# Patient Record
Sex: Female | Born: 1955 | Hispanic: Yes | Marital: Married | Smoking: Never smoker
Health system: Southern US, Community
[De-identification: ages and names within clinical notes are randomized; demographics above are authoritative.]

## PROBLEM LIST (undated history)

## (undated) DIAGNOSIS — E119 Type 2 diabetes mellitus without complications: Secondary | ICD-10-CM

## (undated) DIAGNOSIS — K2970 Gastritis, unspecified, without bleeding: Secondary | ICD-10-CM

## (undated) DIAGNOSIS — Z8042 Family history of malignant neoplasm of prostate: Secondary | ICD-10-CM

## (undated) DIAGNOSIS — E538 Deficiency of other specified B group vitamins: Secondary | ICD-10-CM

## (undated) DIAGNOSIS — R011 Cardiac murmur, unspecified: Secondary | ICD-10-CM

## (undated) DIAGNOSIS — Z9221 Personal history of antineoplastic chemotherapy: Secondary | ICD-10-CM

## (undated) DIAGNOSIS — E785 Hyperlipidemia, unspecified: Secondary | ICD-10-CM

## (undated) DIAGNOSIS — Z8719 Personal history of other diseases of the digestive system: Secondary | ICD-10-CM

## (undated) DIAGNOSIS — Z95828 Presence of other vascular implants and grafts: Secondary | ICD-10-CM

## (undated) DIAGNOSIS — R002 Palpitations: Secondary | ICD-10-CM

## (undated) DIAGNOSIS — I10 Essential (primary) hypertension: Secondary | ICD-10-CM

## (undated) DIAGNOSIS — C50911 Malignant neoplasm of unspecified site of right female breast: Secondary | ICD-10-CM

## (undated) HISTORY — DX: Family history of malignant neoplasm of prostate: Z80.42

## (undated) HISTORY — PX: CHOLECYSTECTOMY: SHX55

## (undated) HISTORY — DX: Palpitations: R00.2

## (undated) HISTORY — DX: Hyperlipidemia, unspecified: E78.5

## (undated) HISTORY — DX: Type 2 diabetes mellitus without complications: E11.9

## (undated) HISTORY — DX: Malignant neoplasm of unspecified site of right female breast: C50.911

## (undated) HISTORY — PX: BREAST BIOPSY: SHX20

## (undated) HISTORY — DX: Essential (primary) hypertension: I10

## (undated) HISTORY — DX: Personal history of other diseases of the digestive system: Z87.19

## (undated) HISTORY — PX: BREAST CYST EXCISION: SHX579

## (undated) HISTORY — PX: PORT-A-CATH REMOVAL: SHX5289

## (undated) HISTORY — DX: Cardiac murmur, unspecified: R01.1

## (undated) HISTORY — DX: Gastritis, unspecified, without bleeding: K29.70

## (undated) MED FILL — Dexamethasone Sodium Phosphate Inj 100 MG/10ML: INTRAMUSCULAR | Qty: 1 | Status: AC

## (undated) MED FILL — Fosaprepitant Dimeglumine For IV Infusion 150 MG (Base Eq): INTRAVENOUS | Qty: 5 | Status: AC

---

## 2015-11-04 HISTORY — PX: BREAST BIOPSY: SHX20

## 2016-11-02 DIAGNOSIS — I10 Essential (primary) hypertension: Secondary | ICD-10-CM | POA: Insufficient documentation

## 2016-11-02 DIAGNOSIS — K2970 Gastritis, unspecified, without bleeding: Secondary | ICD-10-CM | POA: Insufficient documentation

## 2016-11-02 DIAGNOSIS — E119 Type 2 diabetes mellitus without complications: Secondary | ICD-10-CM | POA: Insufficient documentation

## 2016-11-02 DIAGNOSIS — R011 Cardiac murmur, unspecified: Secondary | ICD-10-CM | POA: Insufficient documentation

## 2016-11-15 DIAGNOSIS — Z8719 Personal history of other diseases of the digestive system: Secondary | ICD-10-CM | POA: Insufficient documentation

## 2017-01-17 DIAGNOSIS — R002 Palpitations: Secondary | ICD-10-CM | POA: Insufficient documentation

## 2017-03-22 DIAGNOSIS — F419 Anxiety disorder, unspecified: Secondary | ICD-10-CM | POA: Insufficient documentation

## 2019-09-10 ENCOUNTER — Other Ambulatory Visit: Payer: Self-pay | Admitting: Family Medicine

## 2019-09-10 DIAGNOSIS — Z13820 Encounter for screening for osteoporosis: Secondary | ICD-10-CM

## 2020-03-29 ENCOUNTER — Ambulatory Visit (INDEPENDENT_AMBULATORY_CARE_PROVIDER_SITE_OTHER): Payer: Medicare Other | Admitting: Cardiology

## 2020-03-29 ENCOUNTER — Other Ambulatory Visit: Payer: Self-pay

## 2020-03-29 ENCOUNTER — Encounter: Payer: Self-pay | Admitting: Cardiology

## 2020-03-29 ENCOUNTER — Ambulatory Visit (INDEPENDENT_AMBULATORY_CARE_PROVIDER_SITE_OTHER): Payer: Medicare Other

## 2020-03-29 VITALS — BP 108/62 | HR 74 | Ht 60.0 in | Wt 116.5 lb

## 2020-03-29 DIAGNOSIS — R002 Palpitations: Secondary | ICD-10-CM

## 2020-03-29 DIAGNOSIS — E7800 Pure hypercholesterolemia, unspecified: Secondary | ICD-10-CM | POA: Diagnosis not present

## 2020-03-29 DIAGNOSIS — I10 Essential (primary) hypertension: Secondary | ICD-10-CM

## 2020-03-29 NOTE — Progress Notes (Signed)
Cardiology Office Note:    Date:  03/29/2020   ID:  Susan [name], DOB [birth year], MRN [id number]  PCP:  Patient, No Pcp Per  Cardiologist:  Kate Sable, MD  Electrophysiologist:  None   Referring MD: Bunnie Pion, FNP   No chief complaint on file. cc: palpitations  History of Present Illness:    Susan [name] is a 64 y.o. female with a hx of hypertension, hyperlipidemia, diabetes who presents due to palpitations.  Patient states having symptoms of palpitations for years now which is worsened over the past year.  She states having increased heart rates at least 1-2 times a week lasting about 10 minutes.  She has noticed some chest pressure whenever her heart rates increased.  Exertion such as going up stairs causes increased heart rates.  She denies any history of heart disease.  Takes all her medications as prescribed.  She is able to walk and do all her activities of daily living without any symptoms of chest pain or shortness of breath.  She denies dizziness, edema, orthopnea, syncope.  Past Medical History:  Diagnosis Date  . Diabetes mellitus without complication ([place])   . Hyperlipidemia   . Hypertension     Past Surgical History:  Procedure Laterality Date  . CHOLECYSTECTOMY      Current Medications: Current Meds  Medication Sig  . atorvastatin (LIPITOR) 80 MG tablet Take 80 mg by mouth daily.  . DULoxetine (CYMBALTA) 60 MG capsule Take 60 mg by mouth daily.  . enalapril (VASOTEC) 20 MG tablet Take 20 mg by mouth daily.  . fenofibrate (TRICOR) 145 MG tablet Take 145 mg by mouth daily.  Marland Kitchen gabapentin (NEURONTIN) 300 MG capsule Take 300 mg by mouth in the morning and at bedtime.  Marland Kitchen glipiZIDE (GLUCOTROL XL) 2.5 MG 24 hr tablet Take 2.5 mg by mouth daily.  . hydrochlorothiazide (HYDRODIURIL) 25 MG tablet Take 25 mg by mouth daily.     Allergies:   Contrast media  [iodinated diagnostic agents]   Social History   Socioeconomic History  . Marital status:  Unknown    Spouse name: Not on file  . Number of children: Not on file  . Years of education: Not on file  . Highest education level: Not on file  Occupational History  . Not on file  Tobacco Use  . Smoking status: Never Smoker  . Smokeless tobacco: Never Used  Substance and Sexual Activity  . Alcohol use: Never  . Drug use: Never  . Sexual activity: Not on file  Other Topics Concern  . Not on file  Social History Narrative  . Not on file   Social Determinants of Health   Financial Resource Strain:   . Difficulty of Paying Living Expenses:   Food Insecurity:   . Worried About Charity fundraiser in the Last Year:   . Arboriculturist in the Last Year:   Transportation Needs:   . Film/video editor (Medical):   Marland Kitchen Lack of Transportation (Non-Medical):   Physical Activity:   . Days of Exercise per Week:   . Minutes of Exercise per Session:   Stress:   . Feeling of Stress :   Social Connections:   . Frequency of Communication with Friends and Family:   . Frequency of Social Gatherings with Friends and Family:   . Attends Religious Services:   . Active Member of Clubs or Organizations:   . Attends Archivist Meetings:   .  Marital Status:      Family History: The patient's family history includes Heart disease in her father.  ROS:   Please see the history of present illness.     All other systems reviewed and are negative.  EKGs/Labs/Other Studies Reviewed:    The following studies were reviewed today:  EKG:  EKG is  ordered today.  The ekg ordered today demonstrates normal sinus rhythm, normal ECG.  Recent Labs: No results found for requested labs within last 8760 hours.  Recent Lipid Panel No results found for: CHOL, TRIG, HDL, CHOLHDL, VLDL, LDLCALC, LDLDIRECT  Physical Exam:    VS:  BP 108/62 (BP Location: Right Arm, Patient Position: Sitting, Cuff Size: Normal)   Pulse 74   Ht 5' (1.524 m)   Wt 116 lb 8 oz (52.8 kg)   SpO2 97%   BMI  22.75 kg/m     Wt Readings from Last 3 Encounters:  03/29/20 116 lb 8 oz (52.8 kg)     GEN:  Well nourished, well developed in no acute distress HEENT: Normal NECK: No JVD; No carotid bruits LYMPHATICS: No lymphadenopathy CARDIAC: RRR, no murmurs, rubs, gallops RESPIRATORY:  Clear to auscultation without rales, wheezing or rhonchi  ABDOMEN: Soft, non-tender, non-distended MUSCULOSKELETAL:  No edema; No deformity  SKIN: Warm and dry NEUROLOGIC:  Alert and oriented x 3 PSYCHIATRIC:  Normal affect   ASSESSMENT:    1. Palpitations   2. Essential hypertension   3. Pure hypercholesterolemia    PLAN:    In order of problems listed above:  1. Patient with history of worsening palpitations.  Symptoms are sometimes made worse with exertion.  Will evaluate patient with a cardiac monitor x2 weeks for any arrhythmias. 2. History of hypertension, blood pressure adequately controlled.  Continue HCTZ and enalapril. 3. Patient with history of hyperlipidemia, continue Lipitor 80 mg daily.  Follow-up after cardiac monitor  This note was generated in part or whole with voice recognition software. Voice recognition is usually quite accurate but there are transcription errors that can and very often do occur. I apologize for any typographical errors that were not detected and corrected.  Medication Adjustments/Labs and Tests Ordered: Current medicines are reviewed at length with the patient today.  Concerns regarding medicines are outlined above.  Orders Placed This Encounter  Procedures  . LONG TERM MONITOR (3-14 DAYS)  . EKG 12-Lead   No orders of the defined types were placed in this encounter.   Patient Instructions  Medication Instructions:  Your physician recommends that you continue on your current medications as directed. Please refer to the Current Medication list given to you today.  *If you need a refill on your cardiac medications before your next appointment, please call  your pharmacy*   Lab Work: None ordered If you have labs (blood work) drawn today and your tests are completely normal, you will receive your results only by: Marland Kitchen MyChart Message (if you have MyChart) OR . A paper copy in the mail If you have any lab test that is abnormal or we need to change your treatment, we will call you to review the results.   Testing/Procedures: Your physician has recommended that you wear a Zio monitor. This monitor is a medical device that records the heart's electrical activity. Doctors most often use these monitors to diagnose arrhythmias. Arrhythmias are problems with the speed or rhythm of the heartbeat. The monitor is a small device applied to your chest. You can wear one while you do  your normal daily activities. While wearing this monitor if you have any symptoms to push the button and record what you felt. Once you have worn this monitor for the period of time provider prescribed (Usually 14 days), you will return the monitor device in the postage paid box. Once it is returned they will download the data collected and provide [place] with a report which the provider will then review and we will call you with those results. Important tips:  1. Avoid showering during the first 24 hours of wearing the monitor. 2. Avoid excessive sweating to help maximize wear time. 3. Do not submerge the device, no hot tubs, and no swimming pools. 4. Keep any lotions or oils away from the patch. 5. After 24 hours you may shower with the patch on. Take brief showers with your back facing the shower head.  6. Do not remove patch once it has been placed because that will interrupt data and decrease adhesive wear time. 7. Push the button when you have any symptoms and write down what you were feeling. 8. Once you have completed wearing your monitor, remove and place into box which has postage paid and place in your outgoing mailbox.  9. If for some reason you have misplaced your box then  call our office and we can provide another box and/or mail it off for you.         Follow-Up: At [facility], you and your health needs are our priority.  As part of our continuing mission to provide you with exceptional heart care, we have created designated Provider Care Teams.  These Care Teams include your primary Cardiologist (physician) and Advanced Practice Providers (APPs -  Physician Assistants and Nurse Practitioners) who all work together to provide you with the care you need, when you need it.  We recommend signing up for the patient portal called "MyChart".  Sign up information is provided on this After Visit Summary.  MyChart is used to connect with patients for Virtual Visits (Telemedicine).  Patients are able to view lab/test results, encounter notes, upcoming appointments, etc.  Non-urgent messages can be sent to your provider as well.   To learn more about what you can do with MyChart, go to [email or web address].    Your next appointment:   4 week(s)  The format for your next appointment:   In Person  Provider:    You may see Kate Sable, MD or one of the following Advanced Practice Providers on your designated Care Team:    Murray Hodgkins, NP  Christell Faith, PA-C  Marrianne Mood, PA-C    Other Instructions N/A     Signed, Kate Sable, MD  03/29/2020 5:18 PM    [place]

## 2020-03-29 NOTE — Patient Instructions (Signed)
Medication Instructions:  Your physician recommends that you continue on your current medications as directed. Please refer to the Current Medication list given to you today.  *If you need a refill on your cardiac medications before your next appointment, please call your pharmacy*   Lab Work: None ordered If you have labs (blood work) drawn today and your tests are completely normal, you will receive your results only by: Marland Kitchen MyChart Message (if you have MyChart) OR . A paper copy in the mail If you have any lab test that is abnormal or we need to change your treatment, we will call you to review the results.   Testing/Procedures: Your physician has recommended that you wear a Zio monitor. This monitor is a medical device that records the heart's electrical activity. Doctors most often use these monitors to diagnose arrhythmias. Arrhythmias are problems with the speed or rhythm of the heartbeat. The monitor is a small device applied to your chest. You can wear one while you do your normal daily activities. While wearing this monitor if you have any symptoms to push the button and record what you felt. Once you have worn this monitor for the period of time provider prescribed (Usually 14 days), you will return the monitor device in the postage paid box. Once it is returned they will download the data collected and provide [place] with a report which the provider will then review and we will call you with those results. Important tips:  1. Avoid showering during the first 24 hours of wearing the monitor. 2. Avoid excessive sweating to help maximize wear time. 3. Do not submerge the device, no hot tubs, and no swimming pools. 4. Keep any lotions or oils away from the patch. 5. After 24 hours you may shower with the patch on. Take brief showers with your back facing the shower head.  6. Do not remove patch once it has been placed because that will interrupt data and decrease adhesive wear time. 7. Push  the button when you have any symptoms and write down what you were feeling. 8. Once you have completed wearing your monitor, remove and place into box which has postage paid and place in your outgoing mailbox.  9. If for some reason you have misplaced your box then call our office and we can provide another box and/or mail it off for you.         Follow-Up: At [facility], you and your health needs are our priority.  As part of our continuing mission to provide you with exceptional heart care, we have created designated Provider Care Teams.  These Care Teams include your primary Cardiologist (physician) and Advanced Practice Providers (APPs -  Physician Assistants and Nurse Practitioners) who all work together to provide you with the care you need, when you need it.  We recommend signing up for the patient portal called "MyChart".  Sign up information is provided on this After Visit Summary.  MyChart is used to connect with patients for Virtual Visits (Telemedicine).  Patients are able to view lab/test results, encounter notes, upcoming appointments, etc.  Non-urgent messages can be sent to your provider as well.   To learn more about what you can do with MyChart, go to [email or web address].    Your next appointment:   4 week(s)  The format for your next appointment:   In Person  Provider:    You may see Kate Sable, MD or one of the following Advanced Practice Providers on your  designated Care Team:    Murray Hodgkins, NP  Christell Faith, PA-C  Marrianne Mood, PA-C    Other Instructions N/A

## 2020-04-30 ENCOUNTER — Other Ambulatory Visit: Payer: Self-pay

## 2020-04-30 ENCOUNTER — Encounter: Payer: Self-pay | Admitting: Cardiology

## 2020-04-30 ENCOUNTER — Ambulatory Visit: Payer: Medicare Other | Admitting: Cardiology

## 2020-04-30 VITALS — BP 120/62 | HR 81 | Ht 61.0 in | Wt 120.5 lb

## 2020-04-30 DIAGNOSIS — I471 Supraventricular tachycardia: Secondary | ICD-10-CM

## 2020-04-30 DIAGNOSIS — E7800 Pure hypercholesterolemia, unspecified: Secondary | ICD-10-CM | POA: Diagnosis not present

## 2020-04-30 DIAGNOSIS — I10 Essential (primary) hypertension: Secondary | ICD-10-CM

## 2020-04-30 MED ORDER — HYDROCHLOROTHIAZIDE 12.5 MG PO CAPS
12.5000 mg | ORAL_CAPSULE | Freq: Every day | ORAL | 3 refills | Status: DC
Start: 2020-04-30 — End: 2022-02-22

## 2020-04-30 MED ORDER — METOPROLOL SUCCINATE ER 25 MG PO TB24
25.0000 mg | ORAL_TABLET | Freq: Every day | ORAL | 3 refills | Status: AC
Start: 2020-04-30 — End: ?

## 2020-04-30 NOTE — Patient Instructions (Signed)
Medication Instructions:  Your physician has recommended you make the following change in your medication:  1. START Metoprolol succinate (Topral XL) 25 mg once daily 2. DECREASE Hydrochlorothiazide 12.5 mg once daily   *If you need a refill on your cardiac medications before your next appointment, please call your pharmacy*   Lab Work: None  If you have labs (blood work) drawn today and your tests are completely normal, you will receive your results only by: Marland Kitchen MyChart Message (if you have MyChart) OR . A paper copy in the mail If you have any lab test that is abnormal or we need to change your treatment, we will call you to review the results.   Testing/Procedures: None   Follow-Up: At [facility], you and your health needs are our priority.  As part of our continuing mission to provide you with exceptional heart care, we have created designated Provider Care Teams.  These Care Teams include your primary Cardiologist (physician) and Advanced Practice Providers (APPs -  Physician Assistants and Nurse Practitioners) who all work together to provide you with the care you need, when you need it.  We recommend signing up for the patient portal called "MyChart".  Sign up information is provided on this After Visit Summary.  MyChart is used to connect with patients for Virtual Visits (Telemedicine).  Patients are able to view lab/test results, encounter notes, upcoming appointments, etc.  Non-urgent messages can be sent to your provider as well.   To learn more about what you can do with MyChart, go to [email or web address].    Your next appointment:   5 month(s)  The format for your next appointment:   In Person  Provider:    You may see Kate Sable, MD or one of the following Advanced Practice Providers on your designated Care Team:    Murray Hodgkins, NP  Christell Faith, PA-C  Marrianne Mood, PA-C

## 2020-04-30 NOTE — Progress Notes (Signed)
Cardiology Office Note:    Date:  04/30/2020   ID:  Susan [name], DOB Oct 05, 1956, MRN [id number]  PCP:  Frazier Richards, MD  Cardiologist:  Kate Sable, MD  Electrophysiologist:  None   Referring MD: No ref. provider found   Chief Complaint  Patient presents with  . OTHER    4 wk f/u zio no complaints today. Meds reviewed verbally with pt.    History of Present Illness:   Spanish interpreter used for this visit.  Susan [name] is a 64 y.o. female with a hx of hypertension, hyperlipidemia, diabetes who presents for follow-up.  She was last seen due to palpitations, which are worse with exertion.  Overall symptoms have been worsening of late.  A 2-week cardiac monitor was placed to evaluate any significant arrhythmias.  Patient denies any other symptoms such as chest pain, dizziness, edema, orthopnea, syncope.  She has no new complaints.  She has occasional episodes of palpitations which she states are bothersome.  She is planning on going to [place] in the next 2 weeks.  She plans to stay there for about 3 months.  Past Medical History:  Diagnosis Date  . Diabetes mellitus without complication ([place])   . Hyperlipidemia   . Hypertension     Past Surgical History:  Procedure Laterality Date  . CHOLECYSTECTOMY      Current Medications: Current Meds  Medication Sig  . atorvastatin (LIPITOR) 80 MG tablet Take 80 mg by mouth daily.  . DULoxetine (CYMBALTA) 60 MG capsule Take 60 mg by mouth daily.  . enalapril (VASOTEC) 20 MG tablet Take 20 mg by mouth daily.  . fenofibrate (TRICOR) 145 MG tablet Take 145 mg by mouth daily.  . fluticasone (FLONASE) 50 MCG/ACT nasal spray as needed.  . gabapentin (NEURONTIN) 300 MG capsule Take 300 mg by mouth in the morning and at bedtime.  Marland Kitchen glipiZIDE (GLUCOTROL XL) 2.5 MG 24 hr tablet Take 2.5 mg by mouth daily.  . naproxen (NAPROSYN) 500 MG tablet Take 500 mg by mouth as needed.  Marland Kitchen tiZANidine (ZANAFLEX) 4 MG tablet Take 4 mg by  mouth 3 (three) times daily.  . [DISCONTINUED] hydrochlorothiazide (HYDRODIURIL) 25 MG tablet Take 25 mg by mouth daily.     Allergies:   Contrast media  [iodinated diagnostic agents]   Social History   Socioeconomic History  . Marital status: Unknown    Spouse name: Not on file  . Number of children: Not on file  . Years of education: Not on file  . Highest education level: Not on file  Occupational History  . Not on file  Tobacco Use  . Smoking status: Never Smoker  . Smokeless tobacco: Never Used  Substance and Sexual Activity  . Alcohol use: Never  . Drug use: Never  . Sexual activity: Not on file  Other Topics Concern  . Not on file  Social History Narrative  . Not on file   Social Determinants of Health   Financial Resource Strain:   . Difficulty of Paying Living Expenses:   Food Insecurity:   . Worried About Charity fundraiser in the Last Year:   . Arboriculturist in the Last Year:   Transportation Needs:   . Film/video editor (Medical):   Marland Kitchen Lack of Transportation (Non-Medical):   Physical Activity:   . Days of Exercise per Week:   . Minutes of Exercise per Session:   Stress:   . Feeling of  Stress :   Social Connections:   . Frequency of Communication with Friends and Family:   . Frequency of Social Gatherings with Friends and Family:   . Attends Religious Services:   . Active Member of Clubs or Organizations:   . Attends Archivist Meetings:   Marland Kitchen Marital Status:      Family History: The patient's family history includes Heart disease in her father.  ROS:   Please see the history of present illness.     All other systems reviewed and are negative.  EKGs/Labs/Other Studies Reviewed:    The following studies were reviewed today:  EKG:  EKG is  ordered today.  The ekg ordered today demonstrates normal sinus rhythm, normal ECG.  Recent Labs: No results found for requested labs within last 8760 hours.  Recent Lipid Panel No results  found for: CHOL, TRIG, HDL, CHOLHDL, VLDL, LDLCALC, LDLDIRECT  Physical Exam:    VS:  BP 120/62 (BP Location: Left Arm, Patient Position: Sitting, Cuff Size: Normal)   Pulse 81   Ht 5\' 1"  (1.549 m)   Wt 120 lb 8 oz (54.7 kg)   SpO2 98%   BMI 22.77 kg/m     Wt Readings from Last 3 Encounters:  04/30/20 120 lb 8 oz (54.7 kg)  03/29/20 116 lb 8 oz (52.8 kg)     GEN:  Well nourished, well developed in no acute distress HEENT: Normal NECK: No JVD; No carotid bruits LYMPHATICS: No lymphadenopathy CARDIAC: RRR, no murmurs, rubs, gallops RESPIRATORY:  Clear to auscultation without rales, wheezing or rhonchi  ABDOMEN: Soft, non-tender, non-distended MUSCULOSKELETAL:  No edema; No deformity  SKIN: Warm and dry NEUROLOGIC:  Alert and oriented x 3 PSYCHIATRIC:  Normal affect   ASSESSMENT:    1. Paroxysmal SVT (supraventricular tachycardia) ([place])   2. Essential hypertension   3. Pure hypercholesterolemia    PLAN:    In order of problems listed above:  1. Patient with history of worsening palpitations.  Cardiac monitor did reveal occasional episodes of paroxysmal SVT.  Supraventricular ectopy also noted associated with patient triggered events.  Will start patient on Toprol-XL 25 mg daily.  We will plan for echocardiogram when patient returns from vacation. 2. History of hypertension, blood pressure adequately controlled.  Decrease HCTZ to 12.5 mg daily in light of starting Toprol-XL.  Continue Vasotec 3. Patient with history of hyperlipidemia, continue Lipitor 80 mg daily.  Follow-up in 5 months after patient returns.  Total encounter time 45 minutes  Greater than 50% was spent in counseling and coordination of care with the patient Time used for explaining findings of supraventricular tachycardia on cardiac monitor, Spanish interpreter services, medication management   This note was generated in part or whole with voice recognition software. Voice recognition is usually quite  accurate but there are transcription errors that can and very often do occur. I apologize for any typographical errors that were not detected and corrected.  Medication Adjustments/Labs and Tests Ordered: Current medicines are reviewed at length with the patient today.  Concerns regarding medicines are outlined above.  Orders Placed This Encounter  Procedures  . EKG 12-Lead   Meds ordered this encounter  Medications  . metoprolol succinate (TOPROL XL) 25 MG 24 hr tablet    Sig: Take 1 tablet (25 mg total) by mouth daily.    Dispense:  90 tablet    Refill:  3  . hydrochlorothiazide (MICROZIDE) 12.5 MG capsule    Sig: Take 1 capsule (12.5 mg  total) by mouth daily.    Dispense:  90 capsule    Refill:  3    Patient Instructions  Medication Instructions:  Your physician has recommended you make the following change in your medication:  1. START Metoprolol succinate (Topral XL) 25 mg once daily 2. DECREASE Hydrochlorothiazide 12.5 mg once daily   *If you need a refill on your cardiac medications before your next appointment, please call your pharmacy*   Lab Work: None  If you have labs (blood work) drawn today and your tests are completely normal, you will receive your results only by: Marland Kitchen MyChart Message (if you have MyChart) OR . A paper copy in the mail If you have any lab test that is abnormal or we need to change your treatment, we will call you to review the results.   Testing/Procedures: None   Follow-Up: At [facility], you and your health needs are our priority.  As part of our continuing mission to provide you with exceptional heart care, we have created designated Provider Care Teams.  These Care Teams include your primary Cardiologist (physician) and Advanced Practice Providers (APPs -  Physician Assistants and Nurse Practitioners) who all work together to provide you with the care you need, when you need it.  We recommend signing up for the patient portal called  "MyChart".  Sign up information is provided on this After Visit Summary.  MyChart is used to connect with patients for Virtual Visits (Telemedicine).  Patients are able to view lab/test results, encounter notes, upcoming appointments, etc.  Non-urgent messages can be sent to your provider as well.   To learn more about what you can do with MyChart, go to [email or web address].    Your next appointment:   5 month(s)  The format for your next appointment:   In Person  Provider:    You may see Kate Sable, MD or one of the following Advanced Practice Providers on your designated Care Team:    Murray Hodgkins, NP  Christell Faith, PA-C  Marrianne Mood, PA-C     Signed, Kate Sable, MD  04/30/2020 5:14 PM    [place]

## 2020-10-04 ENCOUNTER — Ambulatory Visit: Payer: Medicare Other | Admitting: Cardiology

## 2020-10-05 ENCOUNTER — Encounter: Payer: Self-pay | Admitting: Cardiology

## 2021-05-24 ENCOUNTER — Telehealth: Payer: Self-pay | Admitting: Cardiology

## 2021-05-24 NOTE — Telephone Encounter (Signed)
-----   Message from Marykay Lex sent at 05/17/2021  9:02 AM EDT ----- Regarding: FW: needs appointment LVM for patient to call back and schedule  ----- Message ----- From: Raelene Bott, Brandy L Sent: 05/17/2021   8:49 AM EDT To: [facility] Scheduling Subject: needs appointment                              Patient did not show for last appointment. Please reschedule F/U office visit for refills. Thank you!

## 2021-05-24 NOTE — Telephone Encounter (Signed)
Attempted to schedule.  LMOV to call office.  ° °

## 2021-06-10 NOTE — Telephone Encounter (Signed)
[place] with [employer]

## 2021-08-26 NOTE — Telephone Encounter (Signed)
Attempted to schedule no ans no vm  

## 2021-12-13 ENCOUNTER — Other Ambulatory Visit: Payer: Self-pay | Admitting: Family Medicine

## 2021-12-15 ENCOUNTER — Other Ambulatory Visit: Payer: Self-pay | Admitting: Family Medicine

## 2021-12-15 DIAGNOSIS — N6310 Unspecified lump in the right breast, unspecified quadrant: Secondary | ICD-10-CM

## 2021-12-19 ENCOUNTER — Inpatient Hospital Stay
Admission: RE | Admit: 2021-12-19 | Discharge: 2021-12-19 | Disposition: A | Discharge status: Home / Self Care | Payer: Self-pay | Source: Ambulatory Visit | Attending: *Deleted | Admitting: *Deleted

## 2021-12-19 ENCOUNTER — Other Ambulatory Visit: Payer: Self-pay | Admitting: *Deleted

## 2021-12-19 DIAGNOSIS — Z1231 Encounter for screening mammogram for malignant neoplasm of breast: Secondary | ICD-10-CM

## 2022-01-06 ENCOUNTER — Ambulatory Visit
Admission: RE | Admit: 2022-01-06 | Discharge: 2022-01-06 | Disposition: A | Discharge status: Home / Self Care | Payer: Medicare HMO | Source: Ambulatory Visit | Attending: Family Medicine | Admitting: Family Medicine

## 2022-01-06 ENCOUNTER — Other Ambulatory Visit: Payer: Self-pay

## 2022-01-06 DIAGNOSIS — N6310 Unspecified lump in the right breast, unspecified quadrant: Secondary | ICD-10-CM

## 2022-01-06 DIAGNOSIS — N6314 Unspecified lump in the right breast, lower inner quadrant: Secondary | ICD-10-CM | POA: Insufficient documentation

## 2022-01-06 DIAGNOSIS — R234 Changes in skin texture: Secondary | ICD-10-CM | POA: Insufficient documentation

## 2022-01-10 ENCOUNTER — Other Ambulatory Visit: Payer: Self-pay | Admitting: Family Medicine

## 2022-01-10 DIAGNOSIS — R928 Other abnormal and inconclusive findings on diagnostic imaging of breast: Secondary | ICD-10-CM

## 2022-01-10 DIAGNOSIS — N6310 Unspecified lump in the right breast, unspecified quadrant: Secondary | ICD-10-CM

## 2022-01-24 ENCOUNTER — Other Ambulatory Visit: Payer: Self-pay

## 2022-01-24 ENCOUNTER — Ambulatory Visit
Admission: RE | Admit: 2022-01-24 | Discharge: 2022-01-24 | Disposition: A | Discharge status: Home / Self Care | Payer: Medicare HMO | Source: Ambulatory Visit | Attending: Family Medicine | Admitting: Family Medicine

## 2022-01-24 DIAGNOSIS — N6310 Unspecified lump in the right breast, unspecified quadrant: Secondary | ICD-10-CM

## 2022-01-24 DIAGNOSIS — R928 Other abnormal and inconclusive findings on diagnostic imaging of breast: Secondary | ICD-10-CM | POA: Diagnosis not present

## 2022-01-24 HISTORY — PX: BREAST BIOPSY: SHX20

## 2022-01-27 ENCOUNTER — Encounter: Payer: Self-pay | Admitting: *Deleted

## 2022-01-27 DIAGNOSIS — C50919 Malignant neoplasm of unspecified site of unspecified female breast: Secondary | ICD-10-CM

## 2022-01-27 NOTE — Progress Notes (Signed)
Referral  received from Electa Sniff for Med/Onc and Surgical consults.  Scheduled with Dr, Janese Banks and Dr. Windell Moment through [place] at [facility] interpreters.    Interpreter from [facility] to call patient with Surgical consult appointment .  Patient reports her sister had breast cancer in her 41's and is deceased.  Genetics referral placed.  Plan to accompany to consult with Dr. Janese Banks.

## 2022-02-01 ENCOUNTER — Other Ambulatory Visit: Payer: Self-pay

## 2022-02-01 ENCOUNTER — Encounter: Payer: Self-pay | Admitting: Oncology

## 2022-02-01 ENCOUNTER — Other Ambulatory Visit: Payer: Self-pay | Admitting: Pathology

## 2022-02-01 ENCOUNTER — Other Ambulatory Visit: Payer: Self-pay | Admitting: *Deleted

## 2022-02-01 ENCOUNTER — Inpatient Hospital Stay: Payer: Medicare HMO | Attending: Oncology | Admitting: Oncology

## 2022-02-01 ENCOUNTER — Inpatient Hospital Stay: Payer: Medicare HMO

## 2022-02-01 VITALS — BP 133/59 | HR 69 | Temp 95.7°F | Resp 16 | Ht 60.0 in | Wt 125.8 lb

## 2022-02-01 DIAGNOSIS — C50311 Malignant neoplasm of lower-inner quadrant of right female breast: Secondary | ICD-10-CM | POA: Insufficient documentation

## 2022-02-01 DIAGNOSIS — Z5181 Encounter for therapeutic drug level monitoring: Secondary | ICD-10-CM

## 2022-02-01 DIAGNOSIS — Z803 Family history of malignant neoplasm of breast: Secondary | ICD-10-CM | POA: Diagnosis not present

## 2022-02-01 DIAGNOSIS — Z171 Estrogen receptor negative status [ER-]: Secondary | ICD-10-CM | POA: Diagnosis not present

## 2022-02-01 DIAGNOSIS — E119 Type 2 diabetes mellitus without complications: Secondary | ICD-10-CM | POA: Diagnosis not present

## 2022-02-01 DIAGNOSIS — Z7984 Long term (current) use of oral hypoglycemic drugs: Secondary | ICD-10-CM | POA: Diagnosis not present

## 2022-02-01 DIAGNOSIS — Z79899 Other long term (current) drug therapy: Secondary | ICD-10-CM | POA: Diagnosis not present

## 2022-02-01 DIAGNOSIS — Z7189 Other specified counseling: Secondary | ICD-10-CM | POA: Insufficient documentation

## 2022-02-01 DIAGNOSIS — Z5111 Encounter for antineoplastic chemotherapy: Secondary | ICD-10-CM | POA: Insufficient documentation

## 2022-02-01 DIAGNOSIS — I10 Essential (primary) hypertension: Secondary | ICD-10-CM | POA: Diagnosis not present

## 2022-02-01 DIAGNOSIS — Z5112 Encounter for antineoplastic immunotherapy: Secondary | ICD-10-CM | POA: Insufficient documentation

## 2022-02-01 LAB — COMPREHENSIVE METABOLIC PANEL
ALT: 31 U/L (ref 0–44)
AST: 23 U/L (ref 15–41)
Albumin: 4.4 g/dL (ref 3.5–5.0)
Alkaline Phosphatase: 52 U/L (ref 38–126)
Anion gap: 5 (ref 5–15)
BUN: 28 mg/dL — ABNORMAL HIGH (ref 8–23)
CO2: 31 mmol/L (ref 22–32)
Calcium: 9.9 mg/dL (ref 8.9–10.3)
Chloride: 100 mmol/L (ref 98–111)
Creatinine, Ser: 0.68 mg/dL (ref 0.44–1.00)
GFR, Estimated: >60 mL/min (ref >60)
Glucose, Bld: 104 mg/dL — ABNORMAL HIGH (ref 70–99)
Potassium: 4.1 mmol/L (ref 3.5–5.1)
Sodium: 136 mmol/L (ref 135–145)
Total Bilirubin: 0.1 mg/dL — ABNORMAL LOW (ref 0.3–1.2)
Total Protein: 7.5 g/dL (ref 6.5–8.1)

## 2022-02-01 LAB — CBC WITH DIFFERENTIAL/PLATELET
Abs Immature Granulocytes: 0.01 10*3/uL (ref 0.00–0.07)
Basophils Absolute: 0.1 10*3/uL (ref 0.0–0.1)
Basophils Relative: 1 %
Eosinophils Absolute: 0.5 10*3/uL (ref 0.0–0.5)
Eosinophils Relative: 8 %
HCT: 36.2 % (ref 36.0–46.0)
Hemoglobin: 12.1 g/dL (ref 12.0–15.0)
Immature Granulocytes: 0 %
Lymphocytes Relative: 31 %
Lymphs Abs: 2 10*3/uL (ref 0.7–4.0)
MCH: 32.1 pg (ref 26.0–34.0)
MCHC: 33.4 g/dL (ref 30.0–36.0)
MCV: 96 fL (ref 80.0–100.0)
Monocytes Absolute: 0.5 10*3/uL (ref 0.1–1.0)
Monocytes Relative: 7 %
Neutro Abs: 3.4 10*3/uL (ref 1.7–7.7)
Neutrophils Relative %: 53 %
Platelets: 227 10*3/uL (ref 150–400)
RBC: 3.77 MIL/uL — ABNORMAL LOW (ref 3.87–5.11)
RDW: 12.8 % (ref 11.5–15.5)
WBC: 6.4 10*3/uL (ref 4.0–10.5)
nRBC: 0 % (ref 0.0–0.2)

## 2022-02-01 LAB — SURGICAL PATHOLOGY

## 2022-02-01 NOTE — Progress Notes (Signed)
Hematology/Oncology Consult note [facility] [phone] [phone]  Patient Care Team: Frazier Richards, MD as PCP - General (Family Medicine) Kate Sable, MD as PCP - Cardiology (Cardiology) Theodore Demark, RN as Oncology Nurse Navigator Sindy Guadeloupe, MD as Consulting Physician (Oncology)   Name of the patient: Susan [name]  [id number]  09-27-1956    Reason for referral-new diagnosis of breast cancer   Referring physician-Dr. Beverlyn Roux  Date of visit: 02/01/22   History of presenting illness- Patient is a 66 year old female who self palpated a right breast lump in February 2023 that led to a diagnostic right breast mammogram.  It showed a suspicious mass at the 5:30 position of the right breast 4 cm from the nipple measuring 1.8 x 1.6 x 1.2 cm.  There may be a thickened echogenic rim which would bring the measurement to 2.9 x 1.9 x 2.3 cm.  Overlying skin thickening.  No axillary adenopathy.  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  Patient referred for further management.  History obtained with the help of Spanish interpreter.  Patient is independent of her ADLs at baseline.She reports some baseline diabetic neuropathy for which she is on gabapentin.  Family history significant for breast cancer in her sister  in her 38s.  She is here with her daughter today.  Reports her appetite and weight haveRemained stable.  She is anxious about her next steps with regards to breast cancer.  ECOG PS- 1  Pain scale- 0   Review of systems- Review of Systems  Constitutional:  Negative for chills, fever, malaise/fatigue and weight loss.  HENT:  Negative for congestion, ear discharge and nosebleeds.   Eyes:  Negative for blurred vision.  Respiratory:  Negative for cough, hemoptysis, sputum production, shortness of breath and wheezing.   Cardiovascular:   Negative for chest pain, palpitations, orthopnea and claudication.  Gastrointestinal:  Negative for abdominal pain, blood in stool, constipation, diarrhea, heartburn, melena, nausea and vomiting.  Genitourinary:  Negative for dysuria, flank pain, frequency, hematuria and urgency.  Musculoskeletal:  Negative for back pain, joint pain and myalgias.  Skin:  Negative for rash.  Neurological:  Negative for dizziness, tingling, focal weakness, seizures, weakness and headaches.  Endo/Heme/Allergies:  Does not bruise/bleed easily.  Psychiatric/Behavioral:  Negative for depression and suicidal ideas. The patient is nervous/anxious. The patient does not have insomnia.    Allergies  Allergen Reactions   Contrast Media  [Iodinated Contrast Media] Nausea And Vomiting and Other (See Comments)    Patient Active Problem List   Diagnosis Date Noted   Malignant neoplasm of lower-inner quadrant of right breast of female, estrogen receptor negative ([place]) 02/01/2022   Goals of care, counseling/discussion 02/01/2022     Past Medical History:  Diagnosis Date   Breast cancer, right breast ([place])    Diabetes mellitus without complication ([place])    Gastritis    Heart murmur    History of pancreatitis    Hyperlipidemia    Hypertension    Palpitations      Past Surgical History:  Procedure Laterality Date   BREAST BIOPSY Right    benign   BREAST CYST EXCISION Right    CHOLECYSTECTOMY      Social History   Socioeconomic History   Marital status: Married    Spouse name: Not on file   Number of children: Not on file   Years of  of education: Not on file   Highest education level: Not on file  Occupational History   Not on file  Tobacco Use   Smoking status: Never   Smokeless tobacco: Never  Vaping Use   Vaping Use: Never used  Substance and Sexual Activity   Alcohol use: Never   Drug use: Never   Sexual activity: Not on file  Other Topics Concern   Not on file  Social History Narrative    Not on file   Social Determinants of Health   Financial Resource Strain: Not on file  Food Insecurity: Not on file  Transportation Needs: Not on file  Physical Activity: Not on file  Stress: Not on file  Social Connections: Not on file  Intimate Partner Violence: Not on file     Family History  Problem Relation Age of Onset   Heart disease Father    Breast cancer Sister      Current Outpatient Medications:    atorvastatin (LIPITOR) 80 MG tablet, Take 80 mg by mouth daily., Disp: , Rfl:    DULoxetine (CYMBALTA) 60 MG capsule, Take 60 mg by mouth daily., Disp: , Rfl:    enalapril (VASOTEC) 20 MG tablet, Take 20 mg by mouth daily., Disp: , Rfl:    fenofibrate (TRICOR) 145 MG tablet, Take 145 mg by mouth daily., Disp: , Rfl:    fluticasone (FLONASE) 50 MCG/ACT nasal spray, as needed., Disp: , Rfl:    gabapentin (NEURONTIN) 300 MG capsule, Take 300 mg by mouth in the morning and at bedtime., Disp: , Rfl:    glipiZIDE (GLUCOTROL XL) 2.5 MG 24 hr tablet, Take 2.5 mg by mouth daily., Disp: , Rfl:    hydrochlorothiazide (MICROZIDE) 12.5 MG capsule, Take 1 capsule (12.5 mg total) by mouth daily., Disp: 90 capsule, Rfl: 3   metoprolol succinate (TOPROL XL) 25 MG 24 hr tablet, Take 1 tablet (25 mg total) by mouth daily., Disp: 90 tablet, Rfl: 3   naproxen (NAPROSYN) 500 MG tablet, Take 500 mg by mouth as needed., Disp: , Rfl:    tiZANidine (ZANAFLEX) 4 MG tablet, Take 4 mg by mouth 3 (three) times daily., Disp: , Rfl:    Physical exam:  Vitals:   02/01/22 1357  BP: (!) 133/59  Pulse: 69  Resp: 16  Temp: (!) 95.7 F (35.4 C)  TempSrc: Tympanic  Weight: 125 lb 12.8 oz (57.1 kg)  Height: 5' (1.524 m)   Physical Exam Constitutional:      General: She is not in acute distress. Cardiovascular:     Rate and Rhythm: Normal rate and regular rhythm.     Heart sounds: Normal heart sounds.  Pulmonary:     Effort: Pulmonary effort is normal.     Breath sounds: Normal breath sounds.   Abdominal:     General: Bowel sounds are normal.     Palpations: Abdomen is soft.  Skin:    General: Skin is warm and dry.  Neurological:     Mental Status: She is alert and oriented to person, place, and time.  Breast exam: There is palpable mass/induration noted at the 5:30 position of the right breast roughly measuring 2.5 cm in size.  No palpable right axillary adenopathy.  No palpable masses in the left breast or left axillary adenopathy.    No flowsheet data found. No flowsheet data found.  No images are attached to the encounter.  US BREAST LTD UNI RIGHT INC AXILLA  Result Date: 01/06/2022 CLINICAL DATA:  Palpable  lump in the right breast. EXAM: DIGITAL DIAGNOSTIC UNILATERAL RIGHT MAMMOGRAM WITH TOMOSYNTHESIS AND CAD; ULTRASOUND RIGHT BREAST LIMITED TECHNIQUE: Right digital diagnostic mammography and breast tomosynthesis was performed. The images were evaluated with computer-aided detection.; Targeted ultrasound examination of the right breast was performed COMPARISON:  Previous exam(s). ACR Breast Density Category c: The breast tissue is heterogeneously dense, which may obscure small masses. FINDINGS: There is an irregular mass in the medial inferior right breast. There is skin thickening in the region of the mass best seen on the MLO view inferiorly. Targeted ultrasound is performed, showing a suspicious mass in the right breast at 5:30, 4 cm from the nipple. The mass is irregular. The mass measures at least 1.8 by 1.6 x 1.2 cm. There may be a thickened echogenic rim which would bring the measurement to 2.9 by 1.9 by 2.3 cm. There is overlying skin thickening. No axillary adenopathy. IMPRESSION: There is a highly suspicious mass in the right breast as above. There is skin thickening overlying the mass suggesting possible inflammatory breast cancer. The mass measures at least 1.8 x 1.6 x 1.2 cm. Taking a subtle thickened rim into account, the mass measures up to 2.9 x 1.9 x 2.3 cm. No  adenopathy. RECOMMENDATION: Recommend ultrasound-guided biopsy of the right breast mass at 5:30, 4 cm from the nipple. If the biopsy is malignant, recommend mammography of the left breast prior to surgery. I have discussed the findings and recommendations with the patient. If applicable, a reminder letter will be sent to the patient regarding the next appointment. BI-RADS CATEGORY  5: Highly suggestive of malignancy. Electronically Signed   By: Dorise Bullion III M.D.   On: 01/06/2022 16:19  MM DIAG BREAST TOMO UNI RIGHT  Result Date: 01/06/2022 CLINICAL DATA:  Palpable lump in the right breast. EXAM: DIGITAL DIAGNOSTIC UNILATERAL RIGHT MAMMOGRAM WITH TOMOSYNTHESIS AND CAD; ULTRASOUND RIGHT BREAST LIMITED TECHNIQUE: Right digital diagnostic mammography and breast tomosynthesis was performed. The images were evaluated with computer-aided detection.; Targeted ultrasound examination of the right breast was performed COMPARISON:  Previous exam(s). ACR Breast Density Category c: The breast tissue is heterogeneously dense, which may obscure small masses. FINDINGS: There is an irregular mass in the medial inferior right breast. There is skin thickening in the region of the mass best seen on the MLO view inferiorly. Targeted ultrasound is performed, showing a suspicious mass in the right breast at 5:30, 4 cm from the nipple. The mass is irregular. The mass measures at least 1.8 by 1.6 x 1.2 cm. There may be a thickened echogenic rim which would bring the measurement to 2.9 by 1.9 by 2.3 cm. There is overlying skin thickening. No axillary adenopathy. IMPRESSION: There is a highly suspicious mass in the right breast as above. There is skin thickening overlying the mass suggesting possible inflammatory breast cancer. The mass measures at least 1.8 x 1.6 x 1.2 cm. Taking a subtle thickened rim into account, the mass measures up to 2.9 x 1.9 x 2.3 cm. No adenopathy. RECOMMENDATION: Recommend ultrasound-guided biopsy of the  right breast mass at 5:30, 4 cm from the nipple. If the biopsy is malignant, recommend mammography of the left breast prior to surgery. I have discussed the findings and recommendations with the patient. If applicable, a reminder letter will be sent to the patient regarding the next appointment. BI-RADS CATEGORY  5: Highly suggestive of malignancy. Electronically Signed   By: Dorise Bullion III M.D.   On: 01/06/2022 16:19  MM CLIP PLACEMENT RIGHT  Result Date: 01/24/2022 CLINICAL DATA:  Status post biopsy of right breast mass EXAM: 3D DIAGNOSTIC RIGHT MAMMOGRAM POST ULTRASOUND BIOPSY COMPARISON:  Previous exam(s). FINDINGS: 3D Mammographic images were obtained following ultrasound guided biopsy of mass at right breast 5:30 o'clock. The biopsy marking clip is in expected position at the site of biopsy. IMPRESSION: Appropriate positioning of the ribbon shaped biopsy marking clip at the site of biopsy in the expected location of concern. Final Assessment: Post Procedure Mammograms for Marker Placement Electronically Signed   By: Abelardo Diesel M.D.   On: 01/24/2022 13:38   [place] RT BREAST BX W LOC DEV 1ST LESION IMG BX SPEC US GUIDE  Addendum Date: 01/26/2022   ADDENDUM REPORT: 01/26/2022 15:58 ADDENDUM: PATHOLOGY revealed: A. BREAST MASS, RIGHT 5:30; ULTRASOUND-GUIDED BIOPSY: - INVASIVE MAMMARY CARCINOMA, NO SPECIAL TYPE. Size of invasive carcinoma: 15 mm in this sample. Grade 3. Ductal carcinoma in situ: Not identified. Lymphovascular invasion: Not identified. Pathology results are CONCORDANT with imaging findings, per Dr. Abelardo Diesel. Pathology results and recommendations were discussed with patient via [place] # [phone] by telephone on 01/26/2022. Patient reported biopsy site doing well with no adverse symptoms, and only slight tenderness at the site. Post biopsy care instructions were reviewed, questions were answered and my direct phone number was provided. Patient was instructed to call [facility] for any additional questions or concerns related to biopsy site. RECOMMENDATION: Surgical consultation. Request for surgical consultation relayed to Al Pimple RN and Tanya Nones RN at [facility] by Electa Sniff RN on 01/26/2022. Pathology results reported by Electa Sniff RN on 01/26/2022. Electronically Signed   By: Abelardo Diesel M.D.   On: 01/26/2022 15:58   Result Date: 01/26/2022 CLINICAL DATA:  Right breast mass for biopsy EXAM: ULTRASOUND GUIDED RIGHT BREAST CORE NEEDLE BIOPSY COMPARISON:  Previous exam(s). PROCEDURE: I met with the patient and we discussed the procedure of ultrasound-guided biopsy, including benefits and alternatives. We discussed the high likelihood of a successful procedure. We discussed the risks of the procedure, including infection, bleeding, tissue injury, clip migration, and inadequate sampling. Informed written consent was given. The usual time-out protocol was performed immediately prior to the procedure. Lesion quadrant: Right breast medial lower quadrant Using sterile technique and 1% Lidocaine as local anesthetic, under direct ultrasound visualization, a 12 gauge spring-loaded device was used to perform biopsy of mass at right breast 5:30 o'clock using a lateral approach. At the conclusion of the procedure ribbon tissue marker clip was deployed into the biopsy cavity. Follow up 2 view mammogram was performed and dictated separately. IMPRESSION: Ultrasound guided biopsy of right breast. No apparent complications. Electronically Signed: By: Abelardo Diesel M.D. On: 01/24/2022 13:29    Assessment and plan- Patient is a 66 y.o. 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  I would also like to get an MRI of her bilateral breasts to a certain if there is any more disease  than what needs to be on mammogram.  Based on the mammogram the mass measures up to 2.9 x 1.9 x 2.3 cm with overlying skin thickening.  Given that this is triple negative which is an aggressive histology, I would recommend neoadjuvant chemotherapy for her.  Patient does have some baseline diabetic neuropathhy along with hypertension and hyperlipidemia but is otherwise independent  of her ADLs.  I will await MRI and systemic scan results before recommending a neoadjuvant regimen for her.  I would also like her to meet Dr. Windell Moment tomorrow and I will coordinate her plan with him.  If she has no evidence of distant metastatic disease but is unable to tolerate neoadjuvant chemotherapy we will consider moving forward with surgery and then completing chemotherapy in the adjuvant setting.  I will obtain baseline echocardiogram as well as either restaging PET scan or CT chest abdomen and pelvis with contrast and bone scan for her.  We will also refer patient to genetic testing and obtain baseline labs CBC with differential and CMP today.  Discussed that following lumpectomy and sentinel lymph node biopsy patient will benefit from adjuvant radiation treatment as well.  No role for endocrine therapy for triple negative breast cancer   Thank you for this kind referral and the opportunity to participate in the care of this patient   Visit Diagnosis 1. Malignant neoplasm of lower-inner quadrant of right breast of female, estrogen receptor negative ([place])   2. Goals of care, counseling/discussion     Dr. Randa Evens, MD, MPH [facility] at [facility] ([place]) [phone] 02/01/2022

## 2022-02-01 NOTE — Progress Notes (Signed)
New pt with breast cancer. ?

## 2022-02-01 NOTE — Progress Notes (Signed)
Entered orders.

## 2022-02-02 ENCOUNTER — Ambulatory Visit
Admission: RE | Admit: 2022-02-02 | Discharge: 2022-02-02 | Disposition: A | Discharge status: Home / Self Care | Payer: Medicare HMO | Source: Ambulatory Visit | Attending: Oncology | Admitting: Oncology

## 2022-02-02 ENCOUNTER — Ambulatory Visit: Payer: Self-pay | Admitting: General Surgery

## 2022-02-02 DIAGNOSIS — Z171 Estrogen receptor negative status [ER-]: Secondary | ICD-10-CM | POA: Diagnosis present

## 2022-02-02 DIAGNOSIS — C50311 Malignant neoplasm of lower-inner quadrant of right female breast: Secondary | ICD-10-CM | POA: Insufficient documentation

## 2022-02-02 DIAGNOSIS — Z1231 Encounter for screening mammogram for malignant neoplasm of breast: Secondary | ICD-10-CM | POA: Insufficient documentation

## 2022-02-02 NOTE — H&P (Signed)
?PATIENT PROFILE: ?Susan [name] is a 66 y.o. female who presents to the Clinic for consultation at the request of Dr. Janese [name] for evaluation of right breast cancer. ? ? ?HISTORY OF PRESENT ILLNESS: ?[name] reports she fell a mass on her right breast a month ago.  This led to diagnostic mammogram.  Diagnostic mammogram shows a 2.5 cm mass on the right breast.  Core biopsy showed invasive mammary carcinoma, triple negative.  Patient denies any nipple discharge.  Patient has history of a sister with breast cancer.  As per patient she died at early age with metastatic breast cancer.  Patient denies chest pain. ? ?Patient was evaluated by medical oncology yesterday.  Due to the finding of triple negative and the size of the mass it was recommended to proceed with bilateral breast MRI and PET scan.  Initial recommendation is the patient will benefit of neoadjuvant therapy. ? ?Family history of breast cancer: Sister ?Family history of other cancers: None ?Menarche: 66 years old ?Menopause: In her 26s ?Used OCP: Denies ?Used estrogen and progesterone therapy: Denies ?History of Radiation to the chest: Denies ?Number of pregnancies: 2 ?Age of first pregnancy: 10 ? ?PROBLEM LIST: ?Breast cancer ? ?GENERAL REVIEW OF SYSTEMS:  ? ?General ROS: negative for - chills, fatigue, fever, weight gain or weight loss ?Allergy and Immunology ROS: negative for - hives  ?Hematological and Lymphatic ROS: negative for - bleeding problems or bruising, negative for palpable nodes ?Endocrine ROS: negative for - heat or cold intolerance, hair changes ?Respiratory ROS: negative for - cough, shortness of breath or wheezing ?Cardiovascular ROS: no chest pain or palpitations ?GI ROS: negative for nausea, vomiting, abdominal pain, diarrhea, constipation ?Musculoskeletal ROS: negative for - joint swelling or muscle pain ?Neurological ROS: negative for - confusion, syncope ?Dermatological ROS: negative for pruritus and rash ?Psychiatric: negative for  anxiety, depression, difficulty sleeping and memory loss ? ?MEDICATIONS: ?Current Outpatient Medications  ?Medication Sig Dispense Refill  ? atorvastatin (LIPITOR) 40 MG tablet Take 40 mg by mouth once daily    ? DULoxetine (CYMBALTA) 60 MG DR capsule Take by mouth once daily    ? enalapril (VASOTEC) 20 MG tablet Take 20 mg by mouth once daily    ? fenofibrate nanocrystallized (TRICOR) 145 MG tablet Take by mouth once daily    ? fluticasone propionate (FLONASE) 50 mcg/actuation nasal spray as needed    ? gabapentin (NEURONTIN) 300 MG capsule Take by mouth 2 (two) times daily    ? glipiZIDE (GLUCOTROL) 2.5 MG XL tablet Take by mouth once daily    ? hydroCHLOROthiazide (MICROZIDE) 12.5 mg capsule Take by mouth once daily    ? LIDODERM 5 % patch once daily    ? meloxicam (MOBIC) 15 MG tablet as needed    ? metoprolol succinate (TOPROL-XL) 25 MG XL tablet Take 1 tablet by mouth once daily    ? naproxen (NAPROSYN) 500 MG tablet Take 500 mg by mouth as needed    ? tiZANidine (ZANAFLEX) 4 MG tablet Take 4 mg by mouth as needed    ? ?No current facility-administered medications for this visit.  ? ? ?ALLERGIES: ?Iodinated contrast media ? ?PAST MEDICAL HISTORY: ?Past Medical History:  ?Diagnosis Date  ? Diabetes mellitus type 2 in nonobese (CMS-HCC)   ? Hyperlipemia   ? Hypertension   ? ? ?PAST SURGICAL HISTORY: ?Past Surgical History:  ?Procedure Laterality Date  ? BREAST EXCISIONAL BIOPSY Right   ? prior to 2018  ? CHOLECYSTECTOMY    ?  ? ?  FAMILY HISTORY: ?Family History  ?Problem Relation Age of Onset  ? Diabetes Mother   ? High blood pressure (Hypertension) Mother   ? Coronary Artery Disease (Blocked arteries around heart) Father   ? Breast cancer Sister   ?  ? ?SOCIAL HISTORY: ?Social History  ? ?Socioeconomic History  ? Marital status: Married  ?Tobacco Use  ? Smoking status: Never  ? Smokeless tobacco: Never  ?Vaping Use  ? Vaping Use: Never used  ?Substance and Sexual Activity  ? Alcohol use: Never  ? Drug use: Never   ? ? ?PHYSICAL EXAM: ?Vitals:  ? 02/02/22 1030  ?BP: 123/71  ?Pulse: 75  ? ?Body mass index is 23.98 kg/m?. ?Weight: 55.7 kg (122 lb 12.8 oz)  ? ?GENERAL: Alert, active, oriented x3 ? ?HEENT: Pupils equal reactive to light. Extraocular movements are intact. Sclera clear. Palpebral conjunctiva normal red color.Pharynx clear. ? ?NECK: Supple with no palpable mass and no adenopathy. ? ?LUNGS: Sound clear with no rales rhonchi or wheezes. ? ?HEART: Regular rhythm S1 and S2 without murmur. ? ?BREAST: There is a palpable mass in the right breast at the 530 o'clock position.  No axillary adenopathy bilaterally.  No skin changes. ? ?ABDOMEN: Soft and depressible, nontender with no palpable mass, no hepatomegaly. ? ?EXTREMITIES: Well-developed well-nourished symmetrical with no dependent edema. ? ?NEUROLOGICAL: Awake alert oriented, facial expression symmetrical, moving all extremities. ? ?REVIEW OF DATA: ?I have reviewed the following data today: ?No results found for any previous visit.  ?I personally evaluated the diagnostic mammogram and ultrasound. ? ?ASSESSMENT: ?[name] is a 66 y.o. female presenting for consultation for right breast cancer.   ? ?Patient was oriented again about the pathology results. Surgical alternatives were discussed with patient including partial vs total mastectomy. Surgical technique and post operative care was discussed with patient. Risk of surgery was discussed with patient including but not limited to: wound infection, seroma, hematoma, brachial plexopathy, mondor's disease (thrombosis of small veins of breast), chronic wound pain, breast lymphedema, altered sensation to the nipple and cosmesis among others.  ? ?Even though we discussed about the type of mastectomies.  Patient initially will have insertion of Chemo-Port for neoadjuvant chemotherapy.  I discussed with patient the procedure of the Chemo-Port insertion.  I discussed with the patient the risk of surgery that includes bleeding,  infection, pain, arteriovenous fistula, pneumothorax, hemothorax, hematoma, among others.  The patient and her daughter endorse that they understood and agreed to proceed. ? ?Malignant neoplasm of lower-inner quadrant of right breast of female, estrogen receptor negative (CMS-HCC) [C50.311, Z17.1] ? ?PLAN: ?1. Insertion of Port a Cath [phone], [id number], [phone]) ?2. CBC, CMP (done yesterday) ?3. Do not take aspirin 5 days before surgery ?4. Contact us if has any question or concern.   ? ?Patient and and her daughter verbalized understanding, all questions were answered, and were agreeable with the plan outlined above.  ? ? ? ?Herbert Pun, MD ? ?Electronically signed by Herbert Pun, MD ?

## 2022-02-02 NOTE — H&P (View-Only) (Signed)
?PATIENT PROFILE: ?Susan [name] is a 66 y.o. female who presents to the Clinic for consultation at the request of Susan [name] for evaluation of right breast cancer. ? ? ?HISTORY OF PRESENT ILLNESS: ?[name] reports she fell a mass on her right breast a month ago.  This led to diagnostic mammogram.  Diagnostic mammogram shows a 2.5 cm mass on the right breast.  Core biopsy showed invasive mammary carcinoma, triple negative.  Patient denies any nipple discharge.  Patient has history of a sister with breast cancer.  As per patient she died at early age with metastatic breast cancer.  Patient denies chest pain. ? ?Patient was evaluated by medical oncology yesterday.  Due to the finding of triple negative and the size of the mass it was recommended to proceed with bilateral breast MRI and PET scan.  Initial recommendation is the patient will benefit of neoadjuvant therapy. ? ?Family history of breast cancer: Sister ?Family history of other cancers: None ?Menarche: 66 years old ?Menopause: In her 49s ?Used OCP: Denies ?Used estrogen and progesterone therapy: Denies ?History of Radiation to the chest: Denies ?Number of pregnancies: 2 ?Age of first pregnancy: 85 ? ?PROBLEM LIST: ?Breast cancer ? ?GENERAL REVIEW OF SYSTEMS:  ? ?General ROS: negative for - chills, fatigue, fever, weight gain or weight loss ?Allergy and Immunology ROS: negative for - hives  ?Hematological and Lymphatic ROS: negative for - bleeding problems or bruising, negative for palpable nodes ?Endocrine ROS: negative for - heat or cold intolerance, hair changes ?Respiratory ROS: negative for - cough, shortness of breath or wheezing ?Cardiovascular ROS: no chest pain or palpitations ?GI ROS: negative for nausea, vomiting, abdominal pain, diarrhea, constipation ?Musculoskeletal ROS: negative for - joint swelling or muscle pain ?Neurological ROS: negative for - confusion, syncope ?Dermatological ROS: negative for pruritus and rash ?Psychiatric: negative for  anxiety, depression, difficulty sleeping and memory loss ? ?MEDICATIONS: ?Current Outpatient Medications  ?Medication Sig Dispense Refill  ? atorvastatin (LIPITOR) 40 MG tablet Take 40 mg by mouth once daily    ? DULoxetine (CYMBALTA) 60 MG DR capsule Take by mouth once daily    ? enalapril (VASOTEC) 20 MG tablet Take 20 mg by mouth once daily    ? fenofibrate nanocrystallized (TRICOR) 145 MG tablet Take by mouth once daily    ? fluticasone propionate (FLONASE) 50 mcg/actuation nasal spray as needed    ? gabapentin (NEURONTIN) 300 MG capsule Take by mouth 2 (two) times daily    ? glipiZIDE (GLUCOTROL) 2.5 MG XL tablet Take by mouth once daily    ? hydroCHLOROthiazide (MICROZIDE) 12.5 mg capsule Take by mouth once daily    ? LIDODERM 5 % patch once daily    ? meloxicam (MOBIC) 15 MG tablet as needed    ? metoprolol succinate (TOPROL-XL) 25 MG XL tablet Take 1 tablet by mouth once daily    ? naproxen (NAPROSYN) 500 MG tablet Take 500 mg by mouth as needed    ? tiZANidine (ZANAFLEX) 4 MG tablet Take 4 mg by mouth as needed    ? ?No current facility-administered medications for this visit.  ? ? ?ALLERGIES: ?Iodinated contrast media ? ?PAST MEDICAL HISTORY: ?Past Medical History:  ?Diagnosis Date  ? Diabetes mellitus type 2 in nonobese (CMS-HCC)   ? Hyperlipemia   ? Hypertension   ? ? ?PAST SURGICAL HISTORY: ?Past Surgical History:  ?Procedure Laterality Date  ? BREAST EXCISIONAL BIOPSY Right   ? prior to 2018  ? CHOLECYSTECTOMY    ?  ? ?  FAMILY HISTORY: ?Family History  ?Problem Relation Age of Onset  ? Diabetes Mother   ? High blood pressure (Hypertension) Mother   ? Coronary Artery Disease (Blocked arteries around heart) Father   ? Breast cancer Sister   ?  ? ?SOCIAL HISTORY: ?Social History  ? ?Socioeconomic History  ? Marital status: Married  ?Tobacco Use  ? Smoking status: Never  ? Smokeless tobacco: Never  ?Vaping Use  ? Vaping Use: Never used  ?Substance and Sexual Activity  ? Alcohol use: Never  ? Drug use: Never   ? ? ?PHYSICAL EXAM: ?Vitals:  ? 02/02/22 1030  ?BP: 123/71  ?Pulse: 75  ? ?Body mass index is 23.98 kg/m?. ?Weight: 55.7 kg (122 lb 12.8 oz)  ? ?GENERAL: Alert, active, oriented x3 ? ?HEENT: Pupils equal reactive to light. Extraocular movements are intact. Sclera clear. Palpebral conjunctiva normal red color.Pharynx clear. ? ?NECK: Supple with no palpable mass and no adenopathy. ? ?LUNGS: Sound clear with no rales rhonchi or wheezes. ? ?HEART: Regular rhythm S1 and S2 without murmur. ? ?BREAST: There is a palpable mass in the right breast at the 530 o'clock position.  No axillary adenopathy bilaterally.  No skin changes. ? ?ABDOMEN: Soft and depressible, nontender with no palpable mass, no hepatomegaly. ? ?EXTREMITIES: Well-developed well-nourished symmetrical with no dependent edema. ? ?NEUROLOGICAL: Awake alert oriented, facial expression symmetrical, moving all extremities. ? ?REVIEW OF DATA: ?I have reviewed the following data today: ?No results found for any previous visit.  ?I personally evaluated the diagnostic mammogram and ultrasound. ? ?ASSESSMENT: ?[name] is a 66 y.o. female presenting for consultation for right breast cancer.   ? ?Patient was oriented again about the pathology results. Surgical alternatives were discussed with patient including partial vs total mastectomy. Surgical technique and post operative care was discussed with patient. Risk of surgery was discussed with patient including but not limited to: wound infection, seroma, hematoma, brachial plexopathy, mondor's disease (thrombosis of small veins of breast), chronic wound pain, breast lymphedema, altered sensation to the nipple and cosmesis among others.  ? ?Even though we discussed about the type of mastectomies.  Patient initially will have insertion of Chemo-Port for neoadjuvant chemotherapy.  I discussed with patient the procedure of the Chemo-Port insertion.  I discussed with the patient the risk of surgery that includes bleeding,  infection, pain, arteriovenous fistula, pneumothorax, hemothorax, hematoma, among others.  The patient and her daughter endorse that they understood and agreed to proceed. ? ?Malignant neoplasm of lower-inner quadrant of right breast of female, estrogen receptor negative (CMS-HCC) [C50.311, Z17.1] ? ?PLAN: ?1. Insertion of Port a Cath [phone], [id number], [phone]) ?2. CBC, CMP (done yesterday) ?3. Do not take aspirin 5 days before surgery ?4. Contact us if has any question or concern.   ? ?Patient and and her daughter verbalized understanding, all questions were answered, and were agreeable with the plan outlined above.  ? ? ? ?Herbert Pun, MD ? ?Electronically signed by Herbert Pun, MD ?

## 2022-02-03 ENCOUNTER — Other Ambulatory Visit: Payer: Self-pay

## 2022-02-03 ENCOUNTER — Ambulatory Visit
Admission: RE | Admit: 2022-02-03 | Discharge: 2022-02-03 | Disposition: A | Discharge status: Home / Self Care | Payer: Medicare HMO | Source: Ambulatory Visit | Attending: Oncology | Admitting: Oncology

## 2022-02-03 DIAGNOSIS — C50311 Malignant neoplasm of lower-inner quadrant of right female breast: Secondary | ICD-10-CM

## 2022-02-03 DIAGNOSIS — Z1231 Encounter for screening mammogram for malignant neoplasm of breast: Secondary | ICD-10-CM | POA: Diagnosis not present

## 2022-02-03 MED ORDER — GADOBUTROL 1 MMOL/ML IV SOLN
6.0000 mL | Freq: Once | INTRAVENOUS | Status: AC | PRN
  Administered 2022-02-03: 6 mL via INTRAVENOUS

## 2022-02-08 ENCOUNTER — Ambulatory Visit: Admission: RE | Admit: 2022-02-08 | Payer: Medicare HMO | Source: Ambulatory Visit

## 2022-02-13 NOTE — Progress Notes (Signed)
Preparation for Tumor Board;  Patient missed her echocardiogram.  Said she did not know she had one scheduled.  Rescheduling ECHO.  Plans for neoadjuvant chemotherapy. PET scheduled for 02/17/22. ?

## 2022-02-14 ENCOUNTER — Telehealth: Payer: Self-pay

## 2022-02-14 NOTE — Telephone Encounter (Signed)
Pt understand new ECHO appointment details for 3/16 at 10am informed pt to arrive at 930am. ?

## 2022-02-15 ENCOUNTER — Inpatient Hospital Stay: Payer: Medicare HMO | Admitting: Hospice and Palliative Medicine

## 2022-02-15 DIAGNOSIS — C50311 Malignant neoplasm of lower-inner quadrant of right female breast: Secondary | ICD-10-CM

## 2022-02-15 NOTE — Progress Notes (Signed)
Multidisciplinary Oncology Council Documentation ? ?Susan [name] was presented by our [facility] on 02/15/2022, which included representatives from:  ?Palliative Care ?Dietitian  ?Physical/Occupational Therapist ?Nurse Navigator ?Genetics ?Speech Therapist ?Social work ?Survivorship RN ?Financial Navigator ?Research RN ? ? ?[name] currently presents with history of breast cancer. ? ?We reviewed previous medical and familial history, history of present illness, and recent lab results along with all available histopathologic and imaging studies. [facility] considered available treatment options and made the following recommendations/referrals: ? ?Social Work ? ?The MOC is a meeting of clinicians from various specialty areas who evaluate and discuss patients for whom a multidisciplinary approach is being considered. Final determinations in the plan of care are those of the provider(s).  ? ?Today's extended care, comprehensive team conference, [name] was not present for the discussion and was not examined.   ?

## 2022-02-16 ENCOUNTER — Other Ambulatory Visit: Payer: Self-pay

## 2022-02-16 ENCOUNTER — Ambulatory Visit
Admission: RE | Admit: 2022-02-16 | Discharge: 2022-02-16 | Disposition: A | Discharge status: Home / Self Care | Payer: Medicare HMO | Source: Ambulatory Visit | Attending: Oncology | Admitting: Oncology

## 2022-02-16 DIAGNOSIS — Z0189 Encounter for other specified special examinations: Secondary | ICD-10-CM | POA: Diagnosis not present

## 2022-02-16 DIAGNOSIS — I10 Essential (primary) hypertension: Secondary | ICD-10-CM | POA: Insufficient documentation

## 2022-02-16 DIAGNOSIS — E119 Type 2 diabetes mellitus without complications: Secondary | ICD-10-CM | POA: Diagnosis not present

## 2022-02-16 DIAGNOSIS — Z171 Estrogen receptor negative status [ER-]: Secondary | ICD-10-CM | POA: Diagnosis not present

## 2022-02-16 DIAGNOSIS — Z5181 Encounter for therapeutic drug level monitoring: Secondary | ICD-10-CM | POA: Diagnosis not present

## 2022-02-16 DIAGNOSIS — C50311 Malignant neoplasm of lower-inner quadrant of right female breast: Secondary | ICD-10-CM | POA: Diagnosis not present

## 2022-02-16 DIAGNOSIS — Z79899 Other long term (current) drug therapy: Secondary | ICD-10-CM | POA: Diagnosis not present

## 2022-02-16 LAB — ECHOCARDIOGRAM COMPLETE
AR max vel: 2.95 cm2
AV Area VTI: 3.23 cm2
AV Area mean vel: 3.17 cm2
AV Mean grad: 4 mmHg
AV Peak grad: 6.9 mmHg
Ao pk vel: 1.31 m/s
Area-P 1/2: 3.08 cm2
MV VTI: 2.62 cm2
S' Lateral: 2.6 cm

## 2022-02-16 NOTE — Progress Notes (Signed)
*  PRELIMINARY RESULTS* ?Echocardiogram ?2D Echocardiogram has been performed. ? ?Susan [name], [name] ?02/16/2022, 10:37 AM ?

## 2022-02-17 ENCOUNTER — Telehealth: Payer: Self-pay

## 2022-02-17 ENCOUNTER — Ambulatory Visit (HOSPITAL_COMMUNITY)
Admission: RE | Admit: 2022-02-17 | Discharge: 2022-02-17 | Disposition: A | Discharge status: Home / Self Care | Payer: Medicare HMO | Source: Ambulatory Visit | Attending: Oncology | Admitting: Oncology

## 2022-02-17 DIAGNOSIS — Z171 Estrogen receptor negative status [ER-]: Secondary | ICD-10-CM | POA: Diagnosis present

## 2022-02-17 DIAGNOSIS — C50311 Malignant neoplasm of lower-inner quadrant of right female breast: Secondary | ICD-10-CM | POA: Diagnosis present

## 2022-02-17 LAB — GLUCOSE, CAPILLARY: Glucose-Capillary: 116 mg/dL — ABNORMAL HIGH (ref 70–99)

## 2022-02-17 MED ORDER — FLUDEOXYGLUCOSE F - 18 (FDG) INJECTION
6.1800 | Freq: Once | INTRAVENOUS | Status: AC
Start: 2022-02-17 — End: 2022-02-17
  Administered 2022-02-17: 6.18 via INTRAVENOUS

## 2022-02-17 NOTE — Telephone Encounter (Signed)
Pt has been scheduled for chemo teach on 3/22 at [facility]. Pts daughter is aware and understands appointment details.  ?

## 2022-02-21 ENCOUNTER — Ambulatory Visit: Payer: Self-pay | Admitting: General Surgery

## 2022-02-21 ENCOUNTER — Encounter: Payer: Self-pay | Admitting: Oncology

## 2022-02-21 ENCOUNTER — Other Ambulatory Visit: Payer: Self-pay

## 2022-02-21 ENCOUNTER — Inpatient Hospital Stay (HOSPITAL_BASED_OUTPATIENT_CLINIC_OR_DEPARTMENT_OTHER): Payer: Medicare HMO | Admitting: Oncology

## 2022-02-21 VITALS — BP 124/66 | HR 80 | Temp 97.4°F | Resp 16 | Ht 60.0 in | Wt 125.0 lb

## 2022-02-21 DIAGNOSIS — C50311 Malignant neoplasm of lower-inner quadrant of right female breast: Secondary | ICD-10-CM

## 2022-02-21 DIAGNOSIS — Z7189 Other specified counseling: Secondary | ICD-10-CM

## 2022-02-21 DIAGNOSIS — Z5112 Encounter for antineoplastic immunotherapy: Secondary | ICD-10-CM | POA: Diagnosis not present

## 2022-02-21 DIAGNOSIS — Z171 Estrogen receptor negative status [ER-]: Secondary | ICD-10-CM | POA: Diagnosis not present

## 2022-02-21 MED ORDER — PROCHLORPERAZINE MALEATE 10 MG PO TABS: 10.0000 mg | ORAL_TABLET | Freq: Four times a day (QID) | ORAL | 1 refills | Status: DC | PRN

## 2022-02-21 MED ORDER — LORAZEPAM 0.5 MG PO TABS: 0.5000 mg | ORAL_TABLET | Freq: Four times a day (QID) | ORAL | 0 refills | Status: DC | PRN

## 2022-02-21 MED ORDER — DEXAMETHASONE 4 MG PO TABS: 8.0000 mg | ORAL_TABLET | Freq: Every day | ORAL | 1 refills | Status: DC

## 2022-02-21 MED ORDER — ONDANSETRON HCL 8 MG PO TABS: 8.0000 mg | ORAL_TABLET | Freq: Two times a day (BID) | ORAL | 1 refills | Status: DC | PRN

## 2022-02-21 MED ORDER — LIDOCAINE-PRILOCAINE 2.5-2.5 % EX CREA: TOPICAL_CREAM | CUTANEOUS | 3 refills | Status: DC

## 2022-02-21 NOTE — Progress Notes (Signed)
? ? ? ?Hematology/Oncology Consult note ?[facility]  ?[phone] [phone] ? ?Patient Care Team: ?Frazier Richards, MD as PCP - General (Family Medicine) ?Kate Sable, MD as PCP - Cardiology (Cardiology) ?Theodore Demark, RN as Oncology Nurse Navigator ?Sindy Guadeloupe, MD as Consulting Physician (Oncology)  ? ?Name of the patient: Susan [name]  ?[id number]  ?Nov 23, 1956  ? ?Date of visit: 02/21/22 ? ?Diagnosis-clinical prognostic stage IIb invasive mammary carcinoma of the right breast cT2 N0 M0 triple negative ? ?Chief complaint/ Reason for visit- discuss further management of breast cancer ? ?Heme/Onc history: Patient is a 66 year old female who self palpated a right breast lump in February 2023 that led to a diagnostic right breast mammogram.  It showed a suspicious mass at the 5:30 position of the right breast 4 cm from the nipple measuring 1.8 x 1.6 x 1.2 cm.  There may be a thickened echogenic rim which would bring the measurement to 2.9 x 1.9 x 2.3 cm.  Overlying skin thickening.  No axillary adenopathy.  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  Patient referred for further management.  History obtained with the help of Spanish interpreter.  Patient is independent of her ADLs at baseline.She reports some baseline diabetic neuropathy for which she is on gabapentin. ?  ?Family history significant for breast cancer in her sister  in her 64s.  She is here with her daughter today.  Reports her appetite and weight haveRemained stable.  She is anxious about her next steps with regards to breast cancer. ?  ? ?Interval history-history obtained with the help of Spanish interpreter.  No acute issues since last visit.  Patient does report occasional pain at the site of the breast mass. ? ?ECOG PS- 1 ?Pain scale- 0 ? ? ?Review of systems- Review of Systems  ?Constitutional:  Negative for  chills, fever, malaise/fatigue and weight loss.  ?HENT:  Negative for congestion, ear discharge and nosebleeds.   ?Eyes:  Negative for blurred vision.  ?Respiratory:  Negative for cough, hemoptysis, sputum production, shortness of breath and wheezing.   ?Cardiovascular:  Negative for chest pain, palpitations, orthopnea and claudication.  ?Gastrointestinal:  Negative for abdominal pain, blood in stool, constipation, diarrhea, heartburn, melena, nausea and vomiting.  ?Genitourinary:  Negative for dysuria, flank pain, frequency, hematuria and urgency.  ?Musculoskeletal:  Negative for back pain, joint pain and myalgias.  ?Skin:  Negative for rash.  ?Neurological:  Negative for dizziness, tingling, focal weakness, seizures, weakness and headaches.  ?Endo/Heme/Allergies:  Does not bruise/bleed easily.  ?Psychiatric/Behavioral:  Negative for depression and suicidal ideas. The patient does not have insomnia.    ? ? ?Allergies  ?Allergen Reactions  ? Contrast Media  [Iodinated Contrast Media] Nausea And Vomiting and Other (See Comments)  ? ? ? ?Past Medical History:  ?Diagnosis Date  ? Breast cancer, right breast ([place])   ? Diabetes mellitus without complication ([place])   ? Gastritis   ? Heart murmur   ? History of pancreatitis   ? Hyperlipidemia   ? Hypertension   ? Palpitations   ? ? ? ?Past Surgical History:  ?Procedure Laterality Date  ? BREAST BIOPSY Right   ? benign  ? BREAST BIOPSY Right 01/24/2022  ? INVASIVE MAMMARY CARCINOMA, NO SPECIAL TYPE  ? BREAST CYST EXCISION Right   ? CHOLECYSTECTOMY    ? ? ?Social History  ? ?Socioeconomic History  ? Marital  status: Married  ?  Spouse name: Not on file  ? Number of children: Not on file  ? Years of education: Not on file  ? Highest education level: Not on file  ?Occupational History  ? Not on file  ?Tobacco Use  ? Smoking status: Never  ? Smokeless tobacco: Never  ?Vaping Use  ? Vaping Use: Never used  ?Substance and Sexual Activity  ? Alcohol use: Never  ? Drug use: Never  ?  Sexual activity: Not on file  ?Other Topics Concern  ? Not on file  ?Social History Narrative  ? Not on file  ? ?Social Determinants of Health  ? ?Financial Resource Strain: Not on file  ?Food Insecurity: Not on file  ?Transportation Needs: Not on file  ?Physical Activity: Not on file  ?Stress: Not on file  ?Social Connections: Not on file  ?Intimate Partner Violence: Not on file  ? ? ?Family History  ?Problem Relation Age of Onset  ? Heart disease Father   ? Breast cancer Sister   ? ? ? ?Current Outpatient Medications:  ?  atorvastatin (LIPITOR) 80 MG tablet, Take 80 mg by mouth daily., Disp: , Rfl:  ?  DULoxetine (CYMBALTA) 60 MG capsule, Take 60 mg by mouth daily., Disp: , Rfl:  ?  enalapril (VASOTEC) 20 MG tablet, Take 20 mg by mouth daily., Disp: , Rfl:  ?  fenofibrate (TRICOR) 145 MG tablet, Take 145 mg by mouth daily., Disp: , Rfl:  ?  fluticasone (FLONASE) 50 MCG/ACT nasal spray, as needed., Disp: , Rfl:  ?  gabapentin (NEURONTIN) 300 MG capsule, Take 300 mg by mouth in the morning and at bedtime., Disp: , Rfl:  ?  glipiZIDE (GLUCOTROL XL) 2.5 MG 24 hr tablet, Take 2.5 mg by mouth daily., Disp: , Rfl:  ?  hydrochlorothiazide (MICROZIDE) 12.5 MG capsule, Take 1 capsule (12.5 mg total) by mouth daily., Disp: 90 capsule, Rfl: 3 ?  metoprolol succinate (TOPROL XL) 25 MG 24 hr tablet, Take 1 tablet (25 mg total) by mouth daily., Disp: 90 tablet, Rfl: 3 ?  naproxen (NAPROSYN) 500 MG tablet, Take 500 mg by mouth as needed., Disp: , Rfl:  ?  tiZANidine (ZANAFLEX) 4 MG tablet, Take 4 mg by mouth 3 (three) times daily., Disp: , Rfl:  ? ?Physical exam:  ?Vitals:  ? 02/21/22 1501  ?BP: 124/66  ?Pulse: 80  ?Resp: 16  ?Temp: (!) 97.4 ?F (36.3 ?C)  ?TempSrc: Tympanic  ?SpO2: 97%  ?Weight: 125 lb (56.7 kg)  ?Height: 5' (1.524 m)  ? ?Physical Exam ?Constitutional:   ?   General: She is not in acute distress. ?Cardiovascular:  ?   Rate and Rhythm: Normal rate and regular rhythm.  ?   Heart sounds: Normal heart sounds.   ?Pulmonary:  ?   Effort: Pulmonary effort is normal.  ?Skin: ?   General: Skin is warm and dry.  ?Neurological:  ?   Mental Status: She is alert and oriented to person, place, and time.  ?  ? ?CMP Latest Ref Rng & Units 02/01/2022  ?Glucose 70 - 99 mg/dL 104(H)  ?BUN 8 - 23 mg/dL 28(H)  ?Creatinine 0.44 - 1.00 mg/dL 0.68  ?Sodium 135 - 145 mmol/L 136  ?Potassium 3.5 - 5.1 mmol/L 4.1  ?Chloride 98 - 111 mmol/L 100  ?CO2 22 - 32 mmol/L 31  ?Calcium 8.9 - 10.3 mg/dL 9.9  ?Total Protein 6.5 - 8.1 g/dL 7.5  ?Total Bilirubin 0.3 - 1.2 mg/dL 0.1(L)  ?  Alkaline Phos 38 - 126 U/L 52  ?AST 15 - 41 U/L 23  ?ALT 0 - 44 U/L 31  ? ?CBC Latest Ref Rng & Units 02/01/2022  ?WBC 4.0 - 10.5 K/uL 6.4  ?Hemoglobin 12.0 - 15.0 g/dL 12.1  ?Hematocrit 36.0 - 46.0 % 36.2  ?Platelets 150 - 400 K/uL 227  ? ? ?No images are attached to the encounter. ? ?MR BREAST BILATERAL W WO CONTRAST INC CAD ? ?Result Date: 02/03/2022 ?CLINICAL DATA:  Known right breast cancer. Evaluate extent of disease. EXAM: BILATERAL BREAST MRI WITH AND WITHOUT CONTRAST TECHNIQUE: Multiplanar, multisequence MR images of both breasts were obtained prior to and following the intravenous administration of 6 ml of Gadavist Three-dimensional MR images were rendered by post-processing of the original MR data on an independent workstation. The three-dimensional MR images were interpreted, and findings are reported in the following complete MRI report for this study. Three dimensional images were evaluated at the independent interpreting workstation using the DynaCAD thin client. COMPARISON:  Mammography January 06, 2022 and February 02, 2022 FINDINGS: Breast composition: c. Heterogeneous fibroglandular tissue. Background parenchymal enhancement: Mild Right breast: The known malignancy in the right breast is identified at approximately 5:30. There is a biopsy tract extending from the skin to the malignancy. There are 2 linear projections off the known malignancy posteriorly. There is  minimal enhancement just anterior to the superior aspect of the known malignancy as seen on series 8, image 37. The known malignancy itself, including the linear posterior projections measures 2.9 by 2.4 by 2.8 c

## 2022-02-21 NOTE — Progress Notes (Signed)
START ON PATHWAY REGIMEN - Breast ? ? ?  Cycles 1 through 4: A cycle is every 21 days: ?    Pembrolizumab  ?    Paclitaxel  ?    Carboplatin  ?    Filgrastim-xxxx  ?  Cycles 5 through 8: A cycle is every 21 days: ?    Pembrolizumab  ?    Doxorubicin  ?    Cyclophosphamide  ?    Pegfilgrastim-xxxx  ? ?**Always confirm dose/schedule in your pharmacy ordering system** ? ?Patient Characteristics: ?Preoperative or Nonsurgical Candidate (Clinical Staging), Neoadjuvant Therapy followed by Surgery, Invasive Disease, Chemotherapy, HER2 Negative/Unknown/Equivocal, ER Negative/Unknown, Platinum Therapy Indicated and Candidate for Checkpoint Inhibitor ?Therapeutic Status: Preoperative or Nonsurgical Candidate (Clinical Staging) ?AJCC M Category: cM0 ?AJCC Grade: G3 ?Breast Surgical Plan: Neoadjuvant Therapy followed by Surgery ?ER Status: Negative (-) ?AJCC 8 Stage Grouping: IIB ?HER2 Status: Negative (-) ?AJCC T Category: cT2 ?AJCC N Category: cN0 ?PR Status: Negative (-) ?Intent of Therapy: ?Curative Intent, Discussed with Patient ?

## 2022-02-21 NOTE — Progress Notes (Signed)
Pt in for follow up, interpreter present today.  Pt states having some tenderness in right outer lower portion of right breast.  ?

## 2022-02-22 ENCOUNTER — Inpatient Hospital Stay: Payer: Medicare HMO

## 2022-02-23 ENCOUNTER — Inpatient Hospital Stay: Payer: Medicare HMO

## 2022-02-23 ENCOUNTER — Encounter: Payer: Self-pay | Admitting: Licensed Clinical Social Worker

## 2022-02-23 ENCOUNTER — Inpatient Hospital Stay (HOSPITAL_BASED_OUTPATIENT_CLINIC_OR_DEPARTMENT_OTHER): Payer: Medicare HMO | Admitting: Licensed Clinical Social Worker

## 2022-02-23 ENCOUNTER — Other Ambulatory Visit: Payer: Self-pay

## 2022-02-23 DIAGNOSIS — Z803 Family history of malignant neoplasm of breast: Secondary | ICD-10-CM | POA: Diagnosis not present

## 2022-02-23 DIAGNOSIS — Z8042 Family history of malignant neoplasm of prostate: Secondary | ICD-10-CM

## 2022-02-23 DIAGNOSIS — Z171 Estrogen receptor negative status [ER-]: Secondary | ICD-10-CM | POA: Diagnosis not present

## 2022-02-23 DIAGNOSIS — C50311 Malignant neoplasm of lower-inner quadrant of right female breast: Secondary | ICD-10-CM | POA: Diagnosis not present

## 2022-02-23 NOTE — Progress Notes (Signed)
REFERRING PROVIDER: ?Susan Guadeloupe, MD ?[place],  [place] [address] ? ?PRIMARY PROVIDER:  ?Susan Richards, MD ? ?PRIMARY REASON FOR VISIT:  ?1. Malignant neoplasm of lower-inner quadrant of right breast of female, estrogen receptor negative ([facility])   ?2. Family history of breast cancer   ? ? ? ?HISTORY OF PRESENT ILLNESS:   ?[name], a 66 y.o. female, was seen for a [facility] cancer genetics consultation at the request of Dr. Janese [name] due to a personal and family history of cancer.  [name] presents to clinic today to discuss the possibility of a hereditary predisposition to cancer, genetic testing, and to further clarify her future cancer risks, as well as potential cancer risks for family members.  ? ?In 2023, at the age of 28, [name] was diagnosed with invasive mammary carcinoma of the right breast, triple negative. The treatment plan includes neoadjuvant chemotherapy, lumpectomy, adjuvant radiation. ? ? ?CANCER HISTORY:  ?Oncology History  ?Malignant neoplasm of lower-inner quadrant of right breast of female, estrogen receptor negative ([facility])  ?02/01/2022 Initial Diagnosis  ? Malignant neoplasm of lower-inner quadrant of right breast of female, estrogen receptor negative ([facility]) ?  ?02/21/2022 Cancer Staging  ? Staging form: Breast, AJCC 8th Edition ?- Clinical stage from 02/21/2022: Stage IIB (cT2, cN0, cM0, G3, ER-, PR-, HER2-) - Signed by Susan Guadeloupe, MD on 02/21/2022 ?Histologic grading system: 3 grade system ? ?  ?02/22/2022 -  Chemotherapy  ? Patient is on Treatment Plan : BREAST Pembrolizumab (200) D1 + Carboplatin (1.5) D1,8,15 + Paclitaxel (80) D1,8,15 q21d X 4 cycles / Pembrolizumab (200) D1 + AC D1 q21d x 4 cycles  ?   ? ? ? ?RISK FACTORS:  ?Menarche was at age 17.  ?First live birth at age 74.  ?Ovaries intact: yes.  ?Hysterectomy: no.  ?Menopausal status: postmenopausal - 40s ?HRT use: 0 years. ?Colonoscopy: yes; normal. ?Mammogram within the last year: yes. ? ? ?Past  Medical History:  ?Diagnosis Date  ? Breast cancer, right breast ([facility])   ? Diabetes mellitus without complication ([facility])   ? Gastritis   ? Heart murmur   ? History of pancreatitis   ? Hyperlipidemia   ? Hypertension   ? Palpitations   ? ? ?Past Surgical History:  ?Procedure Laterality Date  ? BREAST BIOPSY Right   ? benign  ? BREAST BIOPSY Right 01/24/2022  ? INVASIVE MAMMARY CARCINOMA, NO SPECIAL TYPE  ? BREAST CYST EXCISION Right   ? CHOLECYSTECTOMY    ? ? ?Social History  ? ?Socioeconomic History  ? Marital status: Married  ?  Spouse name: Not on file  ? Number of children: Not on file  ? Years of education: Not on file  ? Highest education level: Not on file  ?Occupational History  ? Not on file  ?Tobacco Use  ? Smoking status: Never  ? Smokeless tobacco: Never  ?Vaping Use  ? Vaping Use: Never used  ?Substance and Sexual Activity  ? Alcohol use: Never  ? Drug use: Never  ? Sexual activity: Not on file  ?Other Topics Concern  ? Not on file  ?Social History Narrative  ? Not on file  ? ?Social Determinants of Health  ? ?Financial Resource Strain: Not on file  ?Food Insecurity: Not on file  ?Transportation Needs: Not on file  ?Physical Activity: Not on file  ?Stress: Not on file  ?Social Connections: Not on file  ?  ? ?FAMILY HISTORY:  ?  We obtained a detailed, 4-generation family history.  Significant diagnoses are listed below: ?Family History  ?Problem Relation Age of Onset  ? Heart disease Father   ? Breast cancer Sister   ?     67s  ? Prostate cancer Paternal Grandfather   ? Stomach cancer Cousin   ? ?[name] has 1 daughter, Susan [name], 63 and 1 son, 90. Patient had 3 sisters. One sister had breast cancer in her 43s and died in her 32s.  ? ?[name]'s mother died in her 98s. She is unaware of any cancer diagnoses on this side of the family. ? ?[name]'s father died in his 13s. A paternal cousin had stomach cancer. Paternal grandfather had prostate cancer. ? ?[name] is unaware of previous  family history of genetic testing for hereditary cancer risks. Patient's maternal ancestors are of [place] descent, and paternal ancestors are of [place] descent. There is no reported Ashkenazi Jewish ancestry. There is no known consanguinity. ? ? ? ?GENETIC COUNSELING ASSESSMENT: [name] is a 66 y.o. female with a personal and family history of breast cancer which is somewhat suggestive of a hereditary cancer syndrome and predisposition to cancer. We, therefore, discussed and recommended the following at today's visit.  ? ?DISCUSSION: We discussed that approximately 10% of breast cancer is hereditary. Most cases of hereditary breast cancer are associated with BRCA1/BRCA2 genes, although there are other genes associated with hereditary  cancer as well. Cancers and risks are gene specific.  We discussed that testing is beneficial for several reasons including surgical decision-making for breast cancer, knowing about other cancer risks, identifying potential screening and risk-reduction options that may be appropriate, and to understand if other family members could be at risk for cancer and allow them to undergo genetic testing.  ? ?We reviewed the characteristics, features and inheritance patterns of hereditary cancer syndromes. We also discussed genetic testing, including the appropriate family members to test, the process of testing, insurance coverage and turn-around-time for results. We discussed the implications of a negative, positive and/or variant of uncertain significant result. We recommended [name] pursue genetic testing for the [facility] CustomNext47+RNA gene panel.  ? ?Based on [name]'s personal and family history of cancer, she meets medical criteria for genetic testing. Despite that she meets criteria, she may still have an out of pocket cost. We discussed that if her out of pocket cost for testing is over $100, the laboratory will call and confirm whether she wants to  proceed with testing.  If the out of pocket cost of testing is less than $100 she will be billed by the genetic testing laboratory.  ? ?PLAN: After considering the risks, benefits, and limitations, [name] provided informed consent to pursue genetic testing and the blood sample was sent to [facility] for analysis of the CustomNext47+RNA panel. Results should be available within approximately 2-3 weeks' time, at which point they will be disclosed by telephone to [name], as will any additional recommendations warranted by these results. [name] will receive a summary of her genetic counseling visit and a copy of her results once available. This information will also be available in Epic.  ? ?[name]'s questions were answered to her satisfaction today. Our contact information was provided should additional questions or concerns arise. Thank you for the referral and allowing [place] to share in the care of your patient.  ? ?Faith Rogue, MS, LCGC ?Genetic  Counselor ?[name] ?Phone: [phone] ? ?The patient was seen for a total of 30 minutes in face-to-face genetic counseling.  Patient was seen with her daughter, Susan [name], and [place] interpreter. Dr. Grayland Ormond was available for discussion regarding this case.  ? ?_______________________________________________________________________ ?For Office Staff:  ?Number of people involved in session: 3 ?Was an Intern/ student involved with case: no ? ?

## 2022-02-24 ENCOUNTER — Other Ambulatory Visit
Admission: RE | Admit: 2022-02-24 | Discharge: 2022-02-24 | Disposition: A | Discharge status: Home / Self Care | Payer: Medicare HMO | Source: Ambulatory Visit | Attending: General Surgery | Admitting: General Surgery

## 2022-02-24 DIAGNOSIS — I10 Essential (primary) hypertension: Secondary | ICD-10-CM | POA: Diagnosis not present

## 2022-02-24 DIAGNOSIS — Z01810 Encounter for preprocedural cardiovascular examination: Secondary | ICD-10-CM | POA: Diagnosis present

## 2022-02-24 NOTE — Progress Notes (Signed)
[facility] Clinical Social Work  ?Initial Assessment ? ? ?Susan [name] is a 66 y.o. year old female contacted by phone. Clinical Social Work was referred by medical provider for assessment of psychosocial needs.  ? ?SDOH (Social Determinants of Health) assessments performed: Yes ?SDOH Interventions   ? ?Flowsheet Row Most Recent Value  ?SDOH Interventions   ?Food Insecurity Interventions Intervention Not Indicated  ?Financial Strain Interventions Intervention Not Indicated  ?Housing Interventions Intervention Not Indicated  ?Intimate Partner Violence Interventions Intervention Not Indicated  ?Physical Activity Interventions Intervention Not Indicated  ?Stress Interventions Intervention Not Indicated  ?Social Connections Interventions Intervention Not Indicated  ?Transportation Interventions Intervention Not Indicated  ? ?  ?  ?Distress Screen completed: No ?   ? View : No data to display.  ?  ?  ?  ? ? ? ? ?Family/Social Information:  ?Housing Arrangement: patient lives with daughter and primary caregiver Leandra Kern  [phone] [facility]) ?Family members/support persons in your life? Family, Medical Staff, and Friends/Colleagues ?Transportation concerns: no  ?Employment: Retired. Income source: [place] ?Financial concerns: No ?Type of concern: None ?Food access concerns: yes ?Religious or spiritual practice: yes ?Services Currently in place:   Medicare ? ?Coping/ Adjustment to diagnosis: ?Patient understands treatment plan and what happens next? yes ?Concerns about diagnosis and/or treatment: I'm not especially worried about anything ?Patient reported stressors: Anxiety ?Hopes and priorities: To get better ?Patient enjoys time with family/ friends ?Current coping skills/ strengths: Capable of independent living  and Supportive family/friends  ? ? ? SUMMARY: ?Current SDOH Barriers:  ?Financial constraints related to fixed income ? ?Clinical Social Work Clinical Goal(s):   ?N/A ? ?Interventions: ?Discussed common feeling and emotions when being diagnosed with cancer, and the importance of support during treatment ?Informed patient of the support team roles and support services at [facility] ?Provided CSW contact information and encouraged patient to call with any questions or concerns ?CSW dicussed role in patient care and available resources.  CSW spoke with patient's daughter Ms. Marlowe Sax ? ? ?Follow Up Plan: Patient will contact CSW with any support or resource needs ?Patient verbalizes understanding of plan: Yes ? ? ? ?Kenijah Benningfield, LCSW ?

## 2022-02-24 NOTE — Progress Notes (Signed)
Pharmacist Chemotherapy Monitoring - Initial Assessment   ? ?Anticipated start date: 02/27/22  ? ?The following has been reviewed per standard work regarding the patient's treatment regimen: ?The patient's diagnosis, treatment plan and drug doses, and organ/hematologic function ?Lab orders and baseline tests specific to treatment regimen  ?The treatment plan start date, drug sequencing, and pre-medications ?Prior authorization status  ?Patient's documented medication list, including drug-drug interaction screen and prescriptions for anti-emetics and supportive care specific to the treatment regimen ?The drug concentrations, fluid compatibility, administration routes, and timing of the medications to be used ?The patient's access for treatment and lifetime cumulative dose history, if applicable  ?The patient's medication allergies and previous infusion related reactions, if applicable  ? ?Changes made to treatment plan:  ?treatment plan date ? ?Follow up needed:  ?dose adjustment of carbo due to follow up ? ? ?Susan [name], [name], ?02/24/2022  9:21 AM  ?

## 2022-02-24 NOTE — Patient Instructions (Signed)
Your procedure is scheduled on: Thursday March 01, 2022. ?Su procedimiento est? programado para: Jueves, Rey[place] 2023. ?Report to Day Surgery inside [place] 2dn floor, stop by admissions desk before getting on elevator. ?Pres?ntese a: Science writer del [facility] 2do piso, registrese primero en admisiones antes de subir al [employer].  ?To find out your arrival time please call [phone] between 1PM - 3PM on Wednesday February 28, 2022. ?Para saber su hora de llegada por favor llame al [phone] entre la 1PM - 3PM el d?a: Miercoles 28de Marzo del 2023. ? ?Remember: Instructions that are not followed completely may result in serious medical risk, up to and including death,  ?or upon the discretion of your surgeon and anesthesiologist your surgery may need to be rescheduled.  ?Recuerde: Las instrucciones que no se siguen completamente pueden resultar en un riesgo de salud grave, incluyendo hasta  ?la [place] o a discreci?n de su cirujano y anestesi?logo, su cirug?a se puede posponer. ? ? __X_ 1.Do not eat food or drink fluids after midnight the night before your procedure. No  ?  gum chewing or hard candies.    ? ?  No coma nada ni beba nada despu?s de la medianoche de la noche anterior a su  ?  procedimiento. No coma chicles ni caramelos duros.        ? ?            _X__ 2.Do Not Smoke or use e-cigarettes For 24 Hours Prior to Your Surgery.   ? Do not use any chewable tobacco products for at least 6  ? hours prior to surgery. ? ?  No fume ni use cigarrillos electr?nicos durante las 24 horas previas ?   a su cirug?a.  No use ning?n producto de tabaco masticable durante ?  al menos 6 horas antes de la cirug?a. ?   ? __X_ 3. No alcohol for 24 hours before or after surgery. ?   No tome alcohol durante las 24 horas antes ni despu?s de la cirug?a. ? ? __X__4. On the morning of surgery brush your teeth with toothpaste and water, you ?               may rinse your mouth with mouthwash if you  wish.  Do not swallow any toothpaste of mouthwash. ?  En la ma?ana de la cirug?a, cep?llese los dientes con pasta de dientes y agua, ?               puede enjuagarse la boca con enjuague bucal si lo desea. No ingiera ninguna pasta de dientes o enjuague bucal. ? ? __X__ 5. Notify your doctor if there is any change in your medical condition (cold,fever, infections). ?   Informe a su m?dico si hay alg?n cambio en su condici?n m?dica  ?(resfriado, fiebre, infecciones). ? ? Do not wear jewelry, make-up, hairpins, clips or nail polish. ? No use joyas, maquillajes, pinzas/ganchos para el cabello ni esmalte de u?as. ? Do not wear lotions, powders, or perfumes. You may wear deodorant. ? No use lociones, polvos o perfumes.  Puede usar desodorante.   ? Do not shave 48 hours prior to surgery. Men may shave face and neck. ? No se afeite 48 horas antes de la cirug?a.  Los hombres pueden [employer] cara  y el cuello.  ? Do not bring valuables to the hospital.   ?No lleve objetos de valor al hospital. ? [facility] is not responsible for  any belongings or valuables. ? [facility] no se hace responsable de ning?n tipo de pertenencias u objetos de Geographical information systems officer. ?  ?            Contacts, dentures or bridgework may not be worn into surgery. ? Los lentes de contacto, las dentaduras postizas o puentes no se pueden usar en la cirug?a. ?  ?Leave your suitcase in the car. After surgery it may be brought to your room. ? Deje su maleta en el auto.  Despu?s de la cirug?a podr? traerla a su habitaci?n. ?  ?For patients admitted to the hospital, discharge time is determined by your ? treatment team. ? Para los pacientes que sean ingresados al hospital, el tiempo en el cual se le ? dar? de alta es determinado por su equipo de tratamiento. ?  ?Patients discharged the day of surgery will not be allowed to drive home. ?A los pacientes que se les da de alta el mismo d?a de la cirug?a no se les permitir? conducir a casa. ? ? ?__X__ Take these medicines the  morning of surgery with A SIP OF WATER: ?         Tome estas medicinas la ma?ana de la cirug?a con UN SORBO DE AGUA: ? 1. DULoxetine (CYMBALTA) 60 MG ? 2. gabapentin (NEURONTIN) 300 MG ? 3. metoprolol succinate (TOPROL XL) 25 MG 24 ? 4.      ? 5. ? 6. ? ?____ Fleet Enema (as directed) ?         Enema de Fleet (seg?n lo indicado)   ? ?__X__ Use CHG Soap as directed ?         Utilice el jab?n de CHG seg?n lo indicado ? ?____ Use inhalers on the day of surgery ?         Use los inhaladores el d?a de la cirug?a ? ?____ Stop metformin 2 days prior to surgery ?         Deje de tomar el metformin 2 d?as antes de la cirug?a   ? ?____ Take 1/2 of usual insulin dose the night before surgery and none on the morning of surgery  ?         Tome la mitad de la dosis habitual de insulina la noche antes de la cirug?a y no tome nada en la ma?ana de la  ?           cirug?a ? ?_X___ Stop Anti-inflammatories such as Ibuprofen, Aleve, Advil, Motrin, Aspirins, meloxicam (MOBIC), aspirin, Goody's and or BC powders. ?         Deje de tomar antiinflamatorios el d?a: Ibuprofen, Aleve, Advil, Motrin, Aspirins, meloxicam (MOBIC), aspirin, Goody's and or BC powders. ?  ?__X__ One week prior to surgery- Stop ALL supplements until after surgery Vitamin C   ?         Deje de tomar suplementos hasta despu?s de la cirug?a como Vitamina C  ? ?____ Bring C-Pap to the hospital ?         [place] al hospital ?

## 2022-02-27 ENCOUNTER — Inpatient Hospital Stay: Payer: Medicare HMO

## 2022-02-27 ENCOUNTER — Other Ambulatory Visit: Payer: Self-pay

## 2022-02-27 VITALS — BP 130/68 | HR 69 | Temp 96.3°F | Resp 16 | Wt 127.0 lb

## 2022-02-27 DIAGNOSIS — Z171 Estrogen receptor negative status [ER-]: Secondary | ICD-10-CM

## 2022-02-27 DIAGNOSIS — Z5112 Encounter for antineoplastic immunotherapy: Secondary | ICD-10-CM | POA: Diagnosis not present

## 2022-02-27 LAB — COMPREHENSIVE METABOLIC PANEL
ALT: 25 U/L (ref 0–44)
AST: 21 U/L (ref 15–41)
Albumin: 4 g/dL (ref 3.5–5.0)
Alkaline Phosphatase: 53 U/L (ref 38–126)
Anion gap: 5 (ref 5–15)
BUN: 29 mg/dL — ABNORMAL HIGH (ref 8–23)
CO2: 29 mmol/L (ref 22–32)
Calcium: 9.4 mg/dL (ref 8.9–10.3)
Chloride: 101 mmol/L (ref 98–111)
Creatinine, Ser: 0.78 mg/dL (ref 0.44–1.00)
GFR, Estimated: >60 mL/min (ref >60)
Glucose, Bld: 246 mg/dL — ABNORMAL HIGH (ref 70–99)
Potassium: 4.4 mmol/L (ref 3.5–5.1)
Sodium: 135 mmol/L (ref 135–145)
Total Bilirubin: 0.5 mg/dL (ref 0.3–1.2)
Total Protein: 7 g/dL (ref 6.5–8.1)

## 2022-02-27 LAB — CBC WITH DIFFERENTIAL/PLATELET
Abs Immature Granulocytes: 0.01 10*3/uL (ref 0.00–0.07)
Basophils Absolute: 0 10*3/uL (ref 0.0–0.1)
Basophils Relative: 1 %
Eosinophils Absolute: 0.4 10*3/uL (ref 0.0–0.5)
Eosinophils Relative: 6 %
HCT: 34.1 % — ABNORMAL LOW (ref 36.0–46.0)
Hemoglobin: 11.4 g/dL — ABNORMAL LOW (ref 12.0–15.0)
Immature Granulocytes: 0 %
Lymphocytes Relative: 32 %
Lymphs Abs: 1.9 10*3/uL (ref 0.7–4.0)
MCH: 32.4 pg (ref 26.0–34.0)
MCHC: 33.4 g/dL (ref 30.0–36.0)
MCV: 96.9 fL (ref 80.0–100.0)
Monocytes Absolute: 0.5 10*3/uL (ref 0.1–1.0)
Monocytes Relative: 9 %
Neutro Abs: 3.1 10*3/uL (ref 1.7–7.7)
Neutrophils Relative %: 52 %
Platelets: 203 10*3/uL (ref 150–400)
RBC: 3.52 MIL/uL — ABNORMAL LOW (ref 3.87–5.11)
RDW: 12.7 % (ref 11.5–15.5)
WBC: 5.9 10*3/uL (ref 4.0–10.5)
nRBC: 0 % (ref 0.0–0.2)

## 2022-02-27 LAB — TSH: TSH: 0.774 u[IU]/mL (ref 0.350–4.500)

## 2022-02-27 MED ORDER — SODIUM CHLORIDE 0.9 % IV SOLN
200.0000 mg | Freq: Once | INTRAVENOUS | Status: AC
  Administered 2022-02-27: 200 mg via INTRAVENOUS
  Filled 2022-02-27: qty 8

## 2022-02-27 MED ORDER — SODIUM CHLORIDE 0.9 % IV SOLN
80.0000 mg/m2 | Freq: Once | INTRAVENOUS | Status: AC
  Administered 2022-02-27: 126 mg via INTRAVENOUS
  Filled 2022-02-27: qty 21

## 2022-02-27 MED ORDER — PALONOSETRON HCL INJECTION 0.25 MG/5ML
0.2500 mg | Freq: Once | INTRAVENOUS | Status: AC
  Administered 2022-02-27: 0.25 mg via INTRAVENOUS
  Filled 2022-02-27: qty 5

## 2022-02-27 MED ORDER — DIPHENHYDRAMINE HCL 50 MG/ML IJ SOLN
50.0000 mg | Freq: Once | INTRAMUSCULAR | Status: AC
  Administered 2022-02-27: 50 mg via INTRAVENOUS
  Filled 2022-02-27: qty 1

## 2022-02-27 MED ORDER — SODIUM CHLORIDE 0.9 % IV SOLN
10.0000 mg | Freq: Once | INTRAVENOUS | Status: AC
  Administered 2022-02-27: 10 mg via INTRAVENOUS
  Filled 2022-02-27: qty 10

## 2022-02-27 MED ORDER — SODIUM CHLORIDE 0.9 % IV SOLN
110.8500 mg | Freq: Once | INTRAVENOUS | Status: AC
  Administered 2022-02-27: 110 mg via INTRAVENOUS
  Filled 2022-02-27: qty 11

## 2022-02-27 MED ORDER — SODIUM CHLORIDE 0.9 % IV SOLN
Freq: Once | INTRAVENOUS | Status: AC
  Filled 2022-02-27: qty 250

## 2022-02-27 MED ORDER — FAMOTIDINE IN NACL 20-0.9 MG/50ML-% IV SOLN
20.0000 mg | Freq: Once | INTRAVENOUS | Status: AC
  Administered 2022-02-27: 20 mg via INTRAVENOUS
  Filled 2022-02-27: qty 50

## 2022-02-27 NOTE — Patient Instructions (Signed)
[facility] AT [place]  Discharge Instructions: ?Thank you for choosing [facility] to provide your oncology and hematology care.  ?If you have a lab appointment with the [facility], please go directly to the [facility] and check in at the registration area. ? ?Wear comfortable clothing and clothing appropriate for easy access to any Portacath or PICC line.  ? ?We strive to give you quality time with your provider. You may need to reschedule your appointment if you arrive late (15 or more minutes).  Arriving late affects you and other patients whose appointments are after yours.  Also, if you miss three or more appointments without notifying the office, you may be dismissed from the clinic at the provider?s discretion.    ?  ?For prescription refill requests, have your pharmacy contact our office and allow 72 hours for refills to be completed.   ? ?Today you received the following chemotherapy and/or immunotherapy agents: Keytruda / Taxol / Carboplatin ?  ?To help prevent nausea and vomiting after your treatment, we encourage you to take your nausea medication as directed. ? ?BELOW ARE SYMPTOMS THAT SHOULD BE REPORTED IMMEDIATELY: ?*FEVER GREATER THAN 100.4 F (38 ?C) OR HIGHER ?*CHILLS OR SWEATING ?*NAUSEA AND VOMITING THAT IS NOT CONTROLLED WITH YOUR NAUSEA MEDICATION ?*UNUSUAL SHORTNESS OF BREATH ?*UNUSUAL BRUISING OR BLEEDING ?*URINARY PROBLEMS (pain or burning when urinating, or frequent urination) ?*BOWEL PROBLEMS (unusual diarrhea, constipation, pain near the anus) ?TENDERNESS IN MOUTH AND THROAT WITH OR WITHOUT PRESENCE OF ULCERS (sore throat, sores in mouth, or a toothache) ?UNUSUAL RASH, SWELLING OR PAIN  ?UNUSUAL VAGINAL DISCHARGE OR ITCHING  ? ?Items with * indicate a potential emergency and should be followed up as soon as possible or go to the Emergency Department if any problems should occur. ? ?Please show the CHEMOTHERAPY ALERT CARD or IMMUNOTHERAPY ALERT  CARD at check-in to the Emergency Department and triage nurse. ? ?Should you have questions after your visit or need to cancel or reschedule your appointment, please contact [facility] [place] AT [place]  [phone] and follow the prompts.  Office hours are 8:00 a.m. to 4:30 p.m. Monday - Friday. Please note that voicemails left after 4:00 p.m. may not be returned until the following business day.  We are closed weekends and major holidays. You have access to a nurse at all times for urgent questions. Please call the main number to the clinic [phone] and follow the prompts. ? ?For any non-urgent questions, you may also contact your provider using MyChart. We now offer e-Visits for anyone 48 and older to request care online for non-urgent symptoms. For details visit [email or web address]. ?  ?Also download the MyChart app! Go to the app store, search "MyChart", open the app, select [facility], and log in with your MyChart username and password. ? ?Due to Covid, a mask is required upon entering the hospital/clinic. If you do not have a mask, one will be given to you upon arrival. For doctor visits, patients may have 1 support person aged 24 or older with them. For treatment visits, patients cannot have anyone with them due to current Covid guidelines and our immunocompromised population.  ?

## 2022-02-28 LAB — T4: T4, Total: 7 ug/dL (ref 4.5–12.0)

## 2022-02-28 MED ORDER — ORAL CARE MOUTH RINSE: 15.0000 mL | Freq: Once | OROMUCOSAL | Status: AC

## 2022-02-28 MED ORDER — CHLORHEXIDINE GLUCONATE 0.12 % MT SOLN: 15.0000 mL | Freq: Once | OROMUCOSAL | Status: AC

## 2022-02-28 MED ORDER — CEFAZOLIN SODIUM-DEXTROSE 2-4 GM/100ML-% IV SOLN
2.0000 g | INTRAVENOUS | Status: AC
  Administered 2022-03-01: 2 g via INTRAVENOUS

## 2022-02-28 MED ORDER — SODIUM CHLORIDE 0.9 % IV SOLN
INTRAVENOUS | Status: DC
Start: 2022-02-28 — End: 2022-03-01

## 2022-03-01 ENCOUNTER — Encounter: Payer: Self-pay | Admitting: Licensed Clinical Social Worker

## 2022-03-01 ENCOUNTER — Ambulatory Visit: Payer: Medicare HMO | Admitting: Anesthesiology

## 2022-03-01 ENCOUNTER — Other Ambulatory Visit: Payer: Self-pay

## 2022-03-01 ENCOUNTER — Ambulatory Visit: Payer: Medicare HMO

## 2022-03-01 ENCOUNTER — Ambulatory Visit
Admission: RE | Admit: 2022-03-01 | Discharge: 2022-03-01 | Disposition: A | Discharge status: Home / Self Care | Payer: Medicare HMO | Attending: General Surgery | Admitting: General Surgery

## 2022-03-01 ENCOUNTER — Encounter: Payer: Self-pay | Admitting: General Surgery

## 2022-03-01 ENCOUNTER — Encounter
Admission: RE | Disposition: A | Discharge status: Home / Self Care | Payer: Self-pay | Source: Home / Self Care | Attending: General Surgery

## 2022-03-01 DIAGNOSIS — Z171 Estrogen receptor negative status [ER-]: Secondary | ICD-10-CM | POA: Insufficient documentation

## 2022-03-01 DIAGNOSIS — Z803 Family history of malignant neoplasm of breast: Secondary | ICD-10-CM | POA: Diagnosis not present

## 2022-03-01 DIAGNOSIS — C50311 Malignant neoplasm of lower-inner quadrant of right female breast: Secondary | ICD-10-CM | POA: Insufficient documentation

## 2022-03-01 DIAGNOSIS — E119 Type 2 diabetes mellitus without complications: Secondary | ICD-10-CM | POA: Diagnosis not present

## 2022-03-01 HISTORY — PX: PORTACATH PLACEMENT: SHX2246

## 2022-03-01 LAB — GLUCOSE, CAPILLARY: Glucose-Capillary: 69 mg/dL — ABNORMAL LOW (ref 70–99)

## 2022-03-01 SURGERY — INSERTION, TUNNELED CENTRAL VENOUS DEVICE, WITH PORT
Anesthesia: General | Site: Chest

## 2022-03-01 MED ORDER — FENTANYL CITRATE (PF) 100 MCG/2ML IJ SOLN
INTRAMUSCULAR | Status: AC
  Filled 2022-03-01: qty 2

## 2022-03-01 MED ORDER — CEFAZOLIN SODIUM-DEXTROSE 2-4 GM/100ML-% IV SOLN
INTRAVENOUS | Status: AC
  Filled 2022-03-01: qty 100

## 2022-03-01 MED ORDER — ONDANSETRON HCL 4 MG/2ML IJ SOLN: 4.0000 mg | Freq: Once | INTRAMUSCULAR | Status: DC | PRN

## 2022-03-01 MED ORDER — 0.9 % SODIUM CHLORIDE (POUR BTL) OPTIME
TOPICAL | Status: DC | PRN
Start: 2022-03-01 — End: 2022-03-01

## 2022-03-01 MED ORDER — SODIUM CHLORIDE (PF) 0.9 % IJ SOLN
INTRAMUSCULAR | Status: DC | PRN
Start: 2022-03-01 — End: 2022-03-01
  Administered 2022-03-01: 50 mL

## 2022-03-01 MED ORDER — TRAMADOL HCL 50 MG PO TABS: 50.0000 mg | ORAL_TABLET | Freq: Four times a day (QID) | ORAL | 0 refills | Status: DC | PRN

## 2022-03-01 MED ORDER — SODIUM CHLORIDE 0.9 % IV SOLN
INTRAVENOUS | Status: DC | PRN
  Administered 2022-03-01: 12 mL via INTRAMUSCULAR

## 2022-03-01 MED ORDER — FENTANYL CITRATE (PF) 100 MCG/2ML IJ SOLN: 25.0000 ug | INTRAMUSCULAR | Status: DC | PRN

## 2022-03-01 MED ORDER — HEPARIN SODIUM (PORCINE) 5000 UNIT/ML IJ SOLN
INTRAMUSCULAR | Status: AC
  Filled 2022-03-01: qty 1

## 2022-03-01 MED ORDER — KETAMINE HCL 50 MG/5ML IJ SOSY
PREFILLED_SYRINGE | INTRAMUSCULAR | Status: AC
  Filled 2022-03-01: qty 5

## 2022-03-01 MED ORDER — BUPIVACAINE-EPINEPHRINE (PF) 0.25% -1:200000 IJ SOLN
INTRAMUSCULAR | Status: AC
  Filled 2022-03-01: qty 30

## 2022-03-01 MED ORDER — LIDOCAINE HCL (CARDIAC) PF 100 MG/5ML IV SOSY
PREFILLED_SYRINGE | INTRAVENOUS | Status: DC | PRN
  Administered 2022-03-01: 100 mg via INTRAVENOUS

## 2022-03-01 MED ORDER — MIDAZOLAM HCL 2 MG/2ML IJ SOLN
INTRAMUSCULAR | Status: AC
  Filled 2022-03-01: qty 2

## 2022-03-01 MED ORDER — BUPIVACAINE-EPINEPHRINE (PF) 0.25% -1:200000 IJ SOLN
INTRAMUSCULAR | Status: DC | PRN
Start: 2022-03-01 — End: 2022-03-01
  Administered 2022-03-01: 12 mL
  Administered 2022-03-01: 5 mL

## 2022-03-01 MED ORDER — FENTANYL CITRATE (PF) 100 MCG/2ML IJ SOLN
INTRAMUSCULAR | Status: DC | PRN
  Administered 2022-03-01: 50 ug via INTRAVENOUS

## 2022-03-01 MED ORDER — SODIUM CHLORIDE (PF) 0.9 % IJ SOLN
INTRAMUSCULAR | Status: AC
  Filled 2022-03-01: qty 50

## 2022-03-01 MED ORDER — CHLORHEXIDINE GLUCONATE 0.12 % MT SOLN
OROMUCOSAL | Status: AC
  Administered 2022-03-01: 15 mL via OROMUCOSAL
  Filled 2022-03-01: qty 15

## 2022-03-01 MED ORDER — MIDAZOLAM HCL 2 MG/2ML IJ SOLN
INTRAMUSCULAR | Status: DC | PRN
  Administered 2022-03-01: 2 mg via INTRAVENOUS

## 2022-03-01 MED ORDER — KETAMINE HCL 10 MG/ML IJ SOLN
INTRAMUSCULAR | Status: DC | PRN
  Administered 2022-03-01 (×2): 10 mg via INTRAVENOUS

## 2022-03-01 MED ORDER — PROPOFOL 500 MG/50ML IV EMUL
INTRAVENOUS | Status: AC
  Filled 2022-03-01: qty 50

## 2022-03-01 MED ORDER — PROPOFOL 500 MG/50ML IV EMUL
INTRAVENOUS | Status: DC | PRN
  Administered 2022-03-01: 100 ug/kg/min via INTRAVENOUS

## 2022-03-01 SURGICAL SUPPLY — 37 items
ADH SKN CLS APL DERMABOND .7 (GAUZE/BANDAGES/DRESSINGS) ×1
APL PRP STRL LF DISP 70% ISPRP (MISCELLANEOUS) ×1
BAG DECANTER FOR FLEXI CONT (MISCELLANEOUS) ×2 IMPLANT
BLADE SURG 11 STRL SS SAFETY (MISCELLANEOUS) ×2 IMPLANT
BLADE SURG SZ11 CARB STEEL (BLADE) ×2 IMPLANT
BOOT SUTURE AID YELLOW STND (SUTURE) ×2 IMPLANT
CHLORAPREP W/TINT 26 (MISCELLANEOUS) ×2 IMPLANT
COVER LIGHT HANDLE STERIS (MISCELLANEOUS) ×4 IMPLANT
DERMABOND ADVANCED (GAUZE/BANDAGES/DRESSINGS) ×1
DERMABOND ADVANCED .7 DNX12 (GAUZE/BANDAGES/DRESSINGS) ×1 IMPLANT
DRAPE C-ARM XRAY 36X54 (DRAPES) ×2 IMPLANT
ELECT REM PT RETURN 9FT ADLT (ELECTROSURGICAL) ×2
ELECTRODE REM PT RTRN 9FT ADLT (ELECTROSURGICAL) ×1 IMPLANT
GAUZE 4X4 16PLY ~~LOC~~+RFID DBL (SPONGE) ×2 IMPLANT
GLOVE SURG ENC MOIS LTX SZ6.5 (GLOVE) ×3 IMPLANT
GLOVE SURG UNDER POLY LF SZ6.5 (GLOVE) ×3 IMPLANT
GOWN STRL REUS W/ TWL LRG LVL3 (GOWN DISPOSABLE) ×3 IMPLANT
GOWN STRL REUS W/TWL LRG LVL3 (GOWN DISPOSABLE) ×4
IV NS 500ML (IV SOLUTION) ×2
IV NS 500ML BAXH (IV SOLUTION) ×1 IMPLANT
KIT PORT POWER 8FR ISP CVUE (Port) ×2 IMPLANT
KIT TURNOVER KIT A (KITS) ×2 IMPLANT
LABEL OR SOLS (LABEL) ×2 IMPLANT
MANIFOLD NEPTUNE II (INSTRUMENTS) ×2 IMPLANT
NDL FILTER BLUNT 18X1 1/2 (NEEDLE) ×1 IMPLANT
NEEDLE FILTER BLUNT 18X 1/2SAF (NEEDLE) ×1
NEEDLE FILTER BLUNT 18X1 1/2 (NEEDLE) ×1 IMPLANT
PACK PORT-A-CATH (MISCELLANEOUS) ×2 IMPLANT
SUT MNCRL AB 4-0 PS2 18 (SUTURE) ×2 IMPLANT
SUT PROLENE 2 0 FS (SUTURE) ×2 IMPLANT
SUT VIC AB 2-0 SH 27 (SUTURE) ×2
SUT VIC AB 2-0 SH 27XBRD (SUTURE) ×1 IMPLANT
SUT VIC AB 3-0 SH 27 (SUTURE) ×2
SUT VIC AB 3-0 SH 27X BRD (SUTURE) ×1 IMPLANT
SYR 10ML LL (SYRINGE) ×4 IMPLANT
SYR 3ML LL SCALE MARK (SYRINGE) ×2 IMPLANT
WATER STERILE IRR 500ML POUR (IV SOLUTION) ×2 IMPLANT

## 2022-03-01 NOTE — Op Note (Signed)
SURGICAL PROCEDURE REPORT ? ?DATE OF PROCEDURE: 03/01/2022  ? ?SURGEON: Dr. Windell Moment  ? ?ANESTHESIA: Local with light IV sedation  ? ?PRE-OPERATIVE DIAGNOSIS: Advanced breast cancer requiring durable central venous access for chemotherapy  ? ?POST-OPERATIVE DIAGNOSIS: Same ? ?PROCEDURE(S): (cpt: L5393533) ?1.) Percutaneous access of Left internal jugular vein under ultrasound guidance 2.) Insertion of tunneled Left Internal jugular central venous catheter with subcutaneous port ? ?INTRAOPERATIVE FINDINGS: Patent easily compressible Left internal jugular vein with appropriate respiratory variations and well-secured tunneled central venous catheter with subcutaneous port at completion of the procedure ? ?ESTIMATED BLOOD LOSS: Minimal (<20 mL)  ? ?SPECIMENS: None  ? ?IMPLANTS: 19F tunneled Bard PowerPort central venous catheter with subcutaneous port ? ?DRAINS: None  ? ?COMPLICATIONS: None apparent  ? ?CONDITION AT COMPLETION: Hemodynamically stable, awake  ? ?DISPOSITION: PACU  ? ?INDICATION(S) FOR PROCEDURE:  ?Patient is a 66 y.o. female who presented with advanced breast cancer requiring durable central venous access for chemotherapy. All risks, benefits, and alternatives to above elective procedures were discussed with the patient, who elected to proceed, and informed consent was accordingly obtained at that time. ? ?DETAILS OF PROCEDURE:  ?Patient was brought to the operative suite and appropriately identified. In Trendelenburg position, Left IJ venous access site was prepped and draped in the usual sterile fashion, and following a brief timeout, percutaneous Left IJ venous access was obtained under ultrasound guidance using Seldinger technique, by which local anesthetic was injected over the Left IJ vein, and access needle was inserted under direct ultrasound visualization into the Left IJ vein, through which soft guidewire was advanced, over which access needle was withdrawn. Guidewire was secured, attention  was directed to injection of local anesthetic along the planned tunnel site, 2-3 cm transverse Left chest incision was made and confirmed to accommodate the subcutaneous port, and flushed catheter was tunneled retrograde from the port site over the Left chest to the Left IJ access site with the attached port well-secured to the catheter and within the subcutaneous pocket. Insertion sheath was advanced over the guidewire, which was withdrawn along with the insertion sheath dilator. The catheter was introduced through the sheath and left on the [place] junction under fluoro guidance and catheter cut to desire lenght. Catheter connected to port and fixed to the pocket on two side to avoid twisting. Port was confirmed to withdraw blood and flush easily, after which concentrated heparin was instilled into the port and catheter. Dermis at the subcutaneous pocket was re-approximated using buried interrupted 3-0 Vicryl suture, and 4-0 Monocryl suture was used to re-approximate skin at the insertion and subcutaneous port sites in running subcuticular fashion for the subcutaneous port and buried interrupted fashion for the insertion site. Skin was cleaned, dried, and sterile skin glue was applied. Patient was then safely transferred to PACU for a chest x-ray. ?Ultrasound images are available on paper chart and Fluoroscopy guidance images are available in Epic.  ? ?

## 2022-03-01 NOTE — Progress Notes (Signed)
[facility] CSW Progress Note ? ?Clinical Social Worker contacted caregiver by phone to ask about attending scarf tying class. Patient's main caregiver daughter, Susan [name] [phone] stated the patient would not be attending the scarf tying class but did show interest in the Spanish Speaking Support Group class.  CSW will contact on Wed, before support group to confirm attendance. ? ? ? ?Jatniel Verastegui , LCSW ?

## 2022-03-01 NOTE — Discharge Instructions (Addendum)
?  Diet: Resume home heart healthy regular diet.  ? ?Activity: Increase activity as tolerated. Light activity and walking are encouraged. Do not drive or drink alcohol if taking narcotic pain medications. ? ?Wound care: May shower with soapy water and pat dry (do not rub incisions), but no baths or submerging incision underwater until follow-up. (no swimming)  ? ?Medications: Resume all home medications. For mild to moderate pain: acetaminophen (Tylenol) or ibuprofen (if no kidney disease). Combining Tylenol with alcohol can substantially increase your risk of causing liver disease. Narcotic pain medications, if prescribed, can be used for severe pain, though may cause nausea, constipation, and drowsiness. If you do not need the narcotic pain medication, you do not need to fill the prescription. ? ?Call office [phone]) at any time if any questions, worsening pain, fevers/chills, bleeding, drainage from incision site, or other concerns. ? ? ?AMBULATORY SURGERY  ?DISCHARGE INSTRUCTIONS ? ? ?The drugs that you were given will stay in your system until tomorrow so for the next 24 hours you should not: ? ?Drive an automobile ?Make any legal decisions ?Drink any alcoholic beverage ? ? ?You may resume regular meals tomorrow.  Today it is better to start with liquids and gradually work up to solid foods. ? ?You may eat anything you prefer, but it is better to start with liquids, then soup and crackers, and gradually work up to solid foods. ? ? ?Please notify your doctor immediately if you have any unusual bleeding, trouble breathing, redness and pain at the surgery site, drainage, fever, or pain not relieved by medication. ? ?  ? ?Your post-operative visit with Dr.                     ? ? ?           ?     is: Date:                        Time:   ? ?Please call to schedule your post-operative visit. ? ?Additional Instructions:  ?

## 2022-03-01 NOTE — Interval H&P Note (Signed)
History and Physical Interval Note: ? ?03/01/2022 ?10:55 AM ? ?Susan [name] Susan [name]  has presented today for surgery, with the diagnosis of C50.311, Z17.1 Malignant neoplasm of lower-inner quadrant of rt breast of female, estrogen receptor negative.  The various methods of treatment have been discussed with the patient and family. After consideration of risks, benefits and other options for treatment, the patient has consented to  Procedure(s): ?INSERTION PORT-A-CATH (N/A) as a surgical intervention.  The patient's history has been reviewed, patient examined, no change in status, stable for surgery.  I have reviewed the patient's chart and labs.  Questions were answered to the patient's satisfaction.   ? ? ?Marsha Hillman Cintron-Diaz ? ? ?

## 2022-03-01 NOTE — Anesthesia Preprocedure Evaluation (Signed)
Anesthesia Evaluation  ?Patient identified by MRN, date of birth, ID band ?Patient awake ? ? ? ?Reviewed: ?Allergy & Precautions, H&P , NPO status , Patient's Chart, lab work & pertinent test results, reviewed documented beta blocker date and time  ? ?Airway ?Mallampati: II ? ?TM Distance: >3 FB ?Neck ROM: full ? ? ? Dental ? ?(+) Teeth Intact ?  ?Pulmonary ?neg pulmonary ROS,  ?  ?Pulmonary exam normal ? ? ? ? ? ? ? Cardiovascular ?Exercise Tolerance: Poor ?hypertension, On Medications ?Normal cardiovascular exam+ Valvular Problems/Murmurs  ?Rate:Normal ? ? ?  ?Neuro/Psych ?negative neurological ROS ? negative psych ROS  ? GI/Hepatic ?negative GI ROS, Neg liver ROS,   ?Endo/Other  ?negative endocrine ROSdiabetes, Poorly Controlled, Type 2, Oral Hypoglycemic Agents ? Renal/GU ?negative Renal ROS  ?negative genitourinary ?  ?Musculoskeletal ? ? Abdominal ?  ?Peds ? Hematology ?negative hematology ROS ?(+)   ?Anesthesia Other Findings ? ? Reproductive/Obstetrics ?negative OB ROS ? ?  ? ? ? ? ? ? ? ? ? ? ? ? ? ?  ?  ? ? ? ? ? ? ? ? ?Anesthesia Physical ?Anesthesia Plan ? ?ASA: 3 ? ?Anesthesia Plan: General LMA  ? ?Post-op Pain Management:   ? ?Induction:  ? ?PONV Risk Score and Plan: 4 or greater ? ?Airway Management Planned:  ? ?Additional Equipment:  ? ?Intra-op Plan:  ? ?Post-operative Plan:  ? ?Informed Consent: I have reviewed the patients History and Physical, chart, labs and discussed the procedure including the risks, benefits and alternatives for the proposed anesthesia with the patient or authorized representative who has indicated his/her understanding and acceptance.  ? ? ? ? ? ?Plan Discussed with: CRNA ? ?Anesthesia Plan Comments:   ? ? ? ? ? ? ?Anesthesia Quick Evaluation ? ?

## 2022-03-01 NOTE — Transfer of Care (Signed)
Immediate Anesthesia Transfer of Care Note ? ?Patient: Susan [name] ? ?Procedure(s) Performed: INSERTION PORT-A-CATH (Chest) ? ?Patient Location: PACU ? ?Anesthesia Type:MAC ? ?Level of Consciousness: drowsy ? ?Airway & Oxygen Therapy: Patient Spontanous Breathing ? ?Post-op Assessment: Report given to RN and Post -op Vital signs reviewed and stable ? ?Post vital signs: Reviewed and stable ? ?Last Vitals:  ?Vitals Value Taken Time  ?BP 120/57 03/01/22 1208  ?Temp 36.1 ?C 03/01/22 1208  ?Pulse 66 03/01/22 1208  ?Resp 18 03/01/22 1208  ?SpO2 98 % 03/01/22 1208  ? ? ?Last Pain:  ?Vitals:  ? 03/01/22 1208  ?TempSrc: Temporal  ?PainSc: 0-No pain  ?   ? ?  ? ?Complications: No notable events documented. ?

## 2022-03-06 ENCOUNTER — Inpatient Hospital Stay: Payer: Medicare HMO

## 2022-03-06 ENCOUNTER — Encounter: Payer: Self-pay | Admitting: Oncology

## 2022-03-06 ENCOUNTER — Inpatient Hospital Stay (HOSPITAL_BASED_OUTPATIENT_CLINIC_OR_DEPARTMENT_OTHER): Payer: Medicare HMO | Admitting: Oncology

## 2022-03-06 ENCOUNTER — Inpatient Hospital Stay: Payer: Medicare HMO | Attending: Oncology

## 2022-03-06 VITALS — BP 133/53 | HR 77 | Temp 97.0°F | Resp 16 | Wt 126.1 lb

## 2022-03-06 DIAGNOSIS — C50311 Malignant neoplasm of lower-inner quadrant of right female breast: Secondary | ICD-10-CM | POA: Insufficient documentation

## 2022-03-06 DIAGNOSIS — Z171 Estrogen receptor negative status [ER-]: Secondary | ICD-10-CM | POA: Diagnosis not present

## 2022-03-06 DIAGNOSIS — Z5111 Encounter for antineoplastic chemotherapy: Secondary | ICD-10-CM | POA: Diagnosis present

## 2022-03-06 DIAGNOSIS — Z79899 Other long term (current) drug therapy: Secondary | ICD-10-CM | POA: Insufficient documentation

## 2022-03-06 DIAGNOSIS — Z5112 Encounter for antineoplastic immunotherapy: Secondary | ICD-10-CM | POA: Insufficient documentation

## 2022-03-06 LAB — CBC WITH DIFFERENTIAL/PLATELET
Abs Immature Granulocytes: 0.02 10*3/uL (ref 0.00–0.07)
Basophils Absolute: 0 10*3/uL (ref 0.0–0.1)
Basophils Relative: 1 %
Eosinophils Absolute: 0.3 10*3/uL (ref 0.0–0.5)
Eosinophils Relative: 6 %
HCT: 33.5 % — ABNORMAL LOW (ref 36.0–46.0)
Hemoglobin: 11.3 g/dL — ABNORMAL LOW (ref 12.0–15.0)
Immature Granulocytes: 0 %
Lymphocytes Relative: 28 %
Lymphs Abs: 1.5 10*3/uL (ref 0.7–4.0)
MCH: 32.2 pg (ref 26.0–34.0)
MCHC: 33.7 g/dL (ref 30.0–36.0)
MCV: 95.4 fL (ref 80.0–100.0)
Monocytes Absolute: 0.3 10*3/uL (ref 0.1–1.0)
Monocytes Relative: 5 %
Neutro Abs: 3.2 10*3/uL (ref 1.7–7.7)
Neutrophils Relative %: 60 %
Platelets: 233 10*3/uL (ref 150–400)
RBC: 3.51 MIL/uL — ABNORMAL LOW (ref 3.87–5.11)
RDW: 12.6 % (ref 11.5–15.5)
WBC: 5.4 10*3/uL (ref 4.0–10.5)
nRBC: 0 % (ref 0.0–0.2)

## 2022-03-06 LAB — COMPREHENSIVE METABOLIC PANEL
ALT: 23 U/L (ref 0–44)
AST: 23 U/L (ref 15–41)
Albumin: 3.9 g/dL (ref 3.5–5.0)
Alkaline Phosphatase: 71 U/L (ref 38–126)
Anion gap: 7 (ref 5–15)
BUN: 29 mg/dL — ABNORMAL HIGH (ref 8–23)
CO2: 25 mmol/L (ref 22–32)
Calcium: 8.4 mg/dL — ABNORMAL LOW (ref 8.9–10.3)
Chloride: 100 mmol/L (ref 98–111)
Creatinine, Ser: 0.66 mg/dL (ref 0.44–1.00)
GFR, Estimated: >60 mL/min (ref >60)
Glucose, Bld: 193 mg/dL — ABNORMAL HIGH (ref 70–99)
Potassium: 3.7 mmol/L (ref 3.5–5.1)
Sodium: 132 mmol/L — ABNORMAL LOW (ref 135–145)
Total Bilirubin: 0.2 mg/dL — ABNORMAL LOW (ref 0.3–1.2)
Total Protein: 6.6 g/dL (ref 6.5–8.1)

## 2022-03-06 LAB — TSH: TSH: 1.966 u[IU]/mL (ref 0.350–4.500)

## 2022-03-06 MED ORDER — FAMOTIDINE IN NACL 20-0.9 MG/50ML-% IV SOLN
20.0000 mg | Freq: Once | INTRAVENOUS | Status: AC
  Administered 2022-03-06: 20 mg via INTRAVENOUS
  Filled 2022-03-06: qty 50

## 2022-03-06 MED ORDER — SODIUM CHLORIDE 0.9 % IV SOLN
10.0000 mg | Freq: Once | INTRAVENOUS | Status: AC
  Administered 2022-03-06: 10 mg via INTRAVENOUS
  Filled 2022-03-06: qty 10

## 2022-03-06 MED ORDER — DIPHENHYDRAMINE HCL 50 MG/ML IJ SOLN
50.0000 mg | Freq: Once | INTRAMUSCULAR | Status: AC
  Administered 2022-03-06: 50 mg via INTRAVENOUS
  Filled 2022-03-06: qty 1

## 2022-03-06 MED ORDER — SODIUM CHLORIDE 0.9 % IV SOLN
110.8500 mg | Freq: Once | INTRAVENOUS | Status: AC
  Administered 2022-03-06: 110 mg via INTRAVENOUS
  Filled 2022-03-06: qty 11

## 2022-03-06 MED ORDER — HEPARIN SOD (PORK) LOCK FLUSH 100 UNIT/ML IV SOLN
500.0000 [IU] | Freq: Once | INTRAVENOUS | Status: AC | PRN
  Administered 2022-03-06: 500 [IU]
  Filled 2022-03-06: qty 5

## 2022-03-06 MED ORDER — PALONOSETRON HCL INJECTION 0.25 MG/5ML
0.2500 mg | Freq: Once | INTRAVENOUS | Status: AC
  Administered 2022-03-06: 0.25 mg via INTRAVENOUS
  Filled 2022-03-06: qty 5

## 2022-03-06 MED ORDER — SODIUM CHLORIDE 0.9 % IV SOLN
Freq: Once | INTRAVENOUS | Status: AC
  Filled 2022-03-06: qty 250

## 2022-03-06 MED ORDER — SODIUM CHLORIDE 0.9 % IV SOLN
80.0000 mg/m2 | Freq: Once | INTRAVENOUS | Status: AC
  Administered 2022-03-06: 126 mg via INTRAVENOUS
  Filled 2022-03-06: qty 21

## 2022-03-06 NOTE — Patient Instructions (Signed)
Carboplatin injection ??Qu? es [employer]? ?El CARBOPLATINO es un agente quimioterap?utico. Este medicamento act?a sobre las c?lulas que se dividen r?pidamente, como las c?lulas cancer?genas, y finalmente provoca la muerte de estas c?lulas. Se utiliza en el tratamiento del c?ncer de ovario y muchos otros tipos de c?ncer. ?[employer] puede ser utilizado para otros usos; si tiene alguna pregunta consulte con su proveedor de atenci?n m?dica o con su farmac?utico. ?MARCAS COMUNES: Paraplatin ??Qu? le debo informar a mi profesional de la salud antes de tomar este medicamento? ?Necesita saber si usted presenta alguno de los siguientes problemas o situaciones: ?trastornos sangu?neos ?problemas auditivos ?enfermedad renal ?radioterapia reciente o continuada ?una reacci?n al?rgica o inusual al carboplatino, al cisplatino, a otros agentes quimioterap?uticos, a otros medicamentos, alimentos, colorantes o conservantes ?si est? embarazada o buscando quedar embarazada ?si est? amamantando a un beb? ??C?mo debo utilizar este medicamento? ?Este medicamento se administra normalmente mediante infusi?n por v?a intravenosa. Lo administra un profesional de la salud calificado en un hospital o en un entorno cl?nico. ?Hable con su pediatra para informarse acerca del uso de este medicamento en ni?os. Puede requerir atenci?n especial. ?Sobredosis: P?ngase en contacto inmediatamente con un centro toxicol?gico o una sala de urgencia si usted cree que haya tomado demasiado medicamento. ?ATENCI?N: [employer] es solo para usted. No comparta este medicamento con nadie. ??Qu? sucede si me olvido de una dosis? ?Es importante no olvidar ninguna dosis. Informe a su m?dico o a su profesional de la salud si no puede asistir a una cita. ??Qu? puede interactuar con este medicamento? ?medicamentos para convulsiones ?medicamentos para incrementar los conteos sangu?neos, tales como filgrastim, pegfilgrastim, sargramostim ?ciertos  antibi?ticos, tales como amicacina, gentamicina, neomicina, estreptomicina, tobramicina ?vacunas ?Consulte a su m?dico o a su profesional de la salud antes de tomar cualquiera de los siguientes medicamentos: ?acetaminofeno ?aspirina ?ibuprofeno ?quetoprofeno ?naproxeno ?Puede ser que esta lista no menciona todas las posibles interacciones. Informe a su profesional de la salud de [employer] productos a base de hierbas, medicamentos de venta libre o suplementos nutritivos que est? tomando. Si usted fuma, consume bebidas alcoh?licas o si utiliza drogas ilegales, ind?queselo tambi?n a su profesional de [employer]	[place]. Algunas sustancias pueden interactuar con su medicamento. ??A qu? debo estar atento al [employer]? ?Se supervisar? su estado de salud atentamente mientras reciba este medicamento. Tendr? que hacerse an?lisis de sangre peri?dicos mientras est? tomando este medicamento. ?Este medicamento puede hacerle sentir un Tree surgeon general. Esto es normal ya que la quimioterapia afecta tanto a las c?lulas sanas como a las c?lulas cancerosas. Si presenta alguno de los [employer], inf?rmelos. Sin embargo, contin?e con el tratamiento aun si se siente enfermo, a menos que su m?dico le indique que lo suspenda. ?En algunos casos, podr? recibir [employer] para ayudarle con los efectos secundarios. Siga las instrucciones para usarlos. ?Consulte a su m?dico o a su profesional de la salud por asesoramiento si tiene fiebre, escalofr?os, dolor de garganta o cualquier otro s?ntoma de resfr?o o gripe. No se trate usted mismo. Este medicamento puede reducir la capacidad del cuerpo para combatir infecciones. Trate de no acercarse a personas que est?n enfermas. ?[employer] aumentar el riesgo de [place] o [place]. Consulte a su m?dico o a su profesional de la salud si observa sangrados inusuales. ?Proceda con cuidado al [employer], usar hilo dental o Risk manager palillos para los  dientes, ya que puede contraer una infecci?n o Therapist, art con mayor facilidad. Si se somete a alg?n tratamiento dental, informe a su dentista  que est? Theatre manager. ?Evite tomar productos que contienen aspirina, acetaminofeno, ibuprofeno, naproxeno o quetoprofeno a menos que as? lo indique su m?dico. Estos productos pueden disimular la fiebre. ?No se debe quedar embarazada mientras recibe este medicamento. Las mujeres deben informar a su m?dico si est?n buscando quedar embarazadas o si creen que est?n embarazadas. Existe la posibilidad de efectos secundarios graves a un beb? sin nacer. Para m?s informaci?n hable con su profesional de la salud o su farmac?utico. No debe amamantar a un beb? mientras est? Theatre manager. ??Qu? efectos secundarios puedo tener al [employer] medicamento? ?Efectos secundarios que debe informar a su m?dico o a su profesional de la salud tan pronto como sea posible: ?reacciones al?rgicas como erupci?n cut?nea, picaz?n o urticarias, hinchaz?n de la cara, labios o lengua ?signos de infecci?n - fiebre o escalofr?os, tos, dolor de garganta, dolor o dificultad para orinar ?signos de reducci?n de plaquetas o sangrado - magulladuras, puntos rojos en la piel, heces de color oscuro o con aspecto alquitranado, sangrando por la nariz ?signos de reducci?n de gl?bulos rojos - cansancio o debilidad inusual, desmayos, sensaci?n de mareo ?problemas respiratorios ?cambios de audici?n ?cambios en la visi?n ?dolor en el pecho ?alta presi?n sangu?nea ?conteos sangu?neos bajos - Este medicamento puede reducir la cantidad de gl?bulos blancos, gl?bulos rojos y plaquetas. Su riesgo de infecci?n y Chief Technology Officer ser mayor. ?n?useas, v?mito ?dolor, enrojecimiento, hinchaz?n o irritaci?n en el lugar de la inyecci?n ?dolor, hormigueo, entumecimiento de manos o pies ?problemas de coordinaci?n, del habla, al caminar ?dificultad para orinar o cambios en el volumen de orina ?Efectos secundarios que, por  lo general, no requieren atenci?n m?dica (debe informarlos a su m?dico o a su profesional de la salud si persisten o si son molestos): ?ca?da del cabello ?p?rdida del apetito ?sabor met?lico o cambios en el sentido del gusto ?Puede ser que esta lista no menciona todos los posibles efectos secundarios. Comun?quese a su m?dico por asesoramiento m?dico [employer] secundarios. Usted puede informar los efectos secundarios a la FDA por tel?fono al 1-800-FDA-1088. ??D?nde debo guardar mi medicina? ?Este medicamento se administra en hospitales o cl?nicas y no necesitar? guardarlo en su domicilio. ?ATENCI?N: Este folleto es un resumen. Puede ser que no cubra toda la posible informaci?n. Si usted tiene preguntas acerca de esta medicina, consulte con su m?dico, su farmac?utico o su profesional de Technical sales engineer. ?? 2022 Elsevier/Gold Standard (2015-01-12 00:00:00) ?Paclitaxel injection ??Qu? es [employer]? ?El PACLITAXEL es un agente quimioterap?utico. Este medicamento act?a sobre las c?lulas que se dividen r?pidamente, como las c?lulas cancerosas, y finalmente provoca la muerte de estas c?lulas. Se utiliza en el tratamiento del c?ncer de ovario, mama, pulm?n, sarcoma de [place] y otros tipos de c?ncer. ?[employer] puede ser utilizado para otros usos; si tiene alguna pregunta consulte con su proveedor de atenci?n m?dica o con su farmac?utico. ?MARCAS COMUNES: Onxol, Taxol ??Qu? le debo informar a mi profesional de la salud antes de tomar este medicamento? ?Necesitan saber si usted presenta alguno de los siguientes problemas o situaciones: ?antecedentes de frecuencia cardiaca irregular ?enfermedad hep?tica ?recuentos sangu?neos bajos, como baja cantidad de gl?bulos blancos, plaquetas o gl?bulos rojos ?enfermedad pulmonar o respiratoria, como asma ?hormigueo en las manos o los pies, u otro trastorno del sistema nervioso ?una reacci?n al?rgica o inusual al paclitaxel, al alcohol, al aceite de ricino polioxietilado, a  otros medicamentos quimioterap?uticos, a otros medicamentos, alimentos, colorantes o conservantes ?si est? embarazada o buscando quedar embarazada ?si est? amamantando a un beb? ??C?mo  debo [employer] medicament

## 2022-03-06 NOTE — Progress Notes (Signed)
? ? ? ?Hematology/Oncology Consult note ?[place]  ?[phone] [phone] ? ?Patient Care Team: ?Frazier Richards, MD as PCP - General (Family Medicine) ?Kate Sable, MD as PCP - Cardiology (Cardiology) ?Theodore Demark, RN as Oncology Nurse Navigator ?Sindy Guadeloupe, MD as Consulting Physician (Oncology)  ? ?Name of the patient: Susan [name]  ?[id number]  ?1955-12-09  ? ?Date of visit: 03/06/22 ? 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 ?Chief complaint/ Reason for visit- On treatment assessment prior to cycle 2 of neoadjuvant CarboTaxol chemotherapy ? ?Heme/Onc history: Patient is a 66 year old female who self palpated a right breast lump in February 2023 that led to a diagnostic right breast mammogram.  It showed a suspicious mass at the 5:30 position of the right breast 4 cm from the nipple measuring 1.8 x 1.6 x 1.2 cm.  There may be a thickened echogenic rim which would bring the measurement to 2.9 x 1.9 x 2.3 cm.  Overlying skin thickening.  No axillary adenopathy.  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  Patient referred for further management.  History obtained with the help of Spanish interpreter.  Patient is independent of her ADLs at baseline.She reports some baseline diabetic neuropathy for which she is on gabapentin. ?  ?Family history significant for breast cancer in her sister  in her 50s.  She is here with her daughter today.  Reports her appetite and weight haveRemained stable.  She is anxious about her next steps with regards to breast cancer. ? ?Patient is being treated with neoadjuvant intent with keynote 522 regimen starting with CarboTaxol Keytruda chemotherapy on 02/27/2022 ? ?Interval history-history obtained with the help of Spanish interpreter.  Overall patient tolerated  chemotherapy well.  She had self-limited episode of abdominal discomfort which lasted for a day without any significant nausea or vomiting.  Denies any tingling numbness in her hands and feet. ? ?ECOG PS- 1 ?Pain scale- 0 ? ? ?Review of systems- Review of Systems  ?Constitutional:  Positive for malaise/fatigue. Negative for chills, fever and weight loss.  ?HENT:  Negative for congestion, ear discharge and nosebleeds.   ?Eyes:  Negative for blurred vision.  ?Respiratory:  Negative for cough, hemoptysis, sputum production, shortness of breath and wheezing.   ?Cardiovascular:  Negative for chest pain, palpitations, orthopnea and claudication.  ?Gastrointestinal:  Negative for abdominal pain, blood in stool, constipation, diarrhea, heartburn, melena, nausea and vomiting.  ?Genitourinary:  Negative for dysuria, flank pain, frequency, hematuria and urgency.  ?Musculoskeletal:  Negative for back pain, joint pain and myalgias.  ?Skin:  Negative for rash.  ?Neurological:  Negative for dizziness, tingling, focal weakness, seizures, weakness and headaches.  ?Endo/Heme/Allergies:  Does not bruise/bleed easily.  ?Psychiatric/Behavioral:  Negative for depression and suicidal ideas. The patient does not have insomnia.    ? ?Allergies  ?Allergen Reactions  ? Contrast Media  [Iodinated Contrast Media] Nausea And Vomiting and Other (See Comments)  ? ? ? ?Past Medical History:  ?Diagnosis Date  ? Breast cancer, right breast ([place])   ? Diabetes mellitus without complication ([place])   ? Family history of prostate cancer   ? Gastritis   ? Heart murmur   ? History of pancreatitis   ? Hyperlipidemia   ? Hypertension   ? Palpitations   ? ? ? ?Past Surgical History:  ?Procedure Laterality Date  ?  BREAST BIOPSY Right 11/2015  ? benign  ? BREAST BIOPSY Right 01/24/2022  ? INVASIVE MAMMARY CARCINOMA, NO SPECIAL TYPE  ? BREAST CYST EXCISION Right   ? CESAREAN SECTION    ? 2x  ? CHOLECYSTECTOMY    ? PORTACATH PLACEMENT N/A 03/01/2022  ? Procedure:  INSERTION PORT-A-CATH;  Surgeon: Herbert Pun, MD;  Location: [facility] ORS;  Service: General;  Laterality: N/A;  ? ? ?Social History  ? ?Socioeconomic History  ? Marital status: Married  ?  Spouse name: Not on file  ? Number of children: Not on file  ? Years of education: Not on file  ? Highest education level: Not on file  ?Occupational History  ? Not on file  ?Tobacco Use  ? Smoking status: Never  ? Smokeless tobacco: Never  ?Vaping Use  ? Vaping Use: Never used  ?Substance and Sexual Activity  ? Alcohol use: Never  ? Drug use: Never  ? Sexual activity: Not on file  ?Other Topics Concern  ? Not on file  ?Social History Narrative  ? Not on file  ? ?Social Determinants of Health  ? ?Financial Resource Strain: Low Risk   ? Difficulty of Paying Living Expenses: Not very hard  ?Food Insecurity: No Food Insecurity  ? Worried About Charity fundraiser in the Last Year: Never true  ? Ran Out of Food in the Last Year: Never true  ?Transportation Needs: No Transportation Needs  ? Lack of Transportation (Medical): No  ? Lack of Transportation (Non-Medical): No  ?Physical Activity: Inactive  ? Days of Exercise per Week: 0 days  ? Minutes of Exercise per Session: 0 min  ?Stress: No Stress Concern Present  ? Feeling of Stress : Only a little  ?Social Connections: Moderately Integrated  ? Frequency of Communication with Friends and Family: Three times a week  ? Frequency of Social Gatherings with Friends and Family: Three times a week  ? Attends Religious Services: 1 to 4 times per year  ? Active Member of Clubs or Organizations: No  ? Attends Archivist Meetings: Never  ? Marital Status: Married  ?Intimate Partner Violence: Not At Risk  ? Fear of Current or Ex-Partner: No  ? Emotionally Abused: No  ? Physically Abused: No  ? Sexually Abused: No  ? ? ?Family History  ?Problem Relation Age of Onset  ? Heart disease Father   ? Breast cancer Sister   ?     49s  ? Prostate cancer Paternal Grandfather   ? Stomach  cancer Cousin   ? ? ? ?Current Outpatient Medications:  ?  aspirin 81 MG EC tablet, Take 1 tablet by mouth daily., Disp: , Rfl:  ?  atorvastatin (LIPITOR) 40 MG tablet, Take by mouth., Disp: , Rfl:  ?  atorvastatin (LIPITOR) 80 MG tablet, Take 80 mg by mouth daily., Disp: , Rfl:  ?  dexamethasone (DECADRON) 4 MG tablet, Take 2 tablets (8 mg total) by mouth daily. Start the day after chemotherapy for 2 days., Disp: 30 tablet, Rfl: 1 ?  DULoxetine (CYMBALTA) 60 MG capsule, Take 60 mg by mouth daily., Disp: , Rfl:  ?  enalapril (VASOTEC) 20 MG tablet, Take 20 mg by mouth daily., Disp: , Rfl:  ?  fenofibrate (TRICOR) 145 MG tablet, Take 145 mg by mouth daily., Disp: , Rfl:  ?  fluticasone (FLONASE) 50 MCG/ACT nasal spray, Place 2 sprays into both nostrils daily as needed for allergies or rhinitis., Disp: , Rfl:  ?  gabapentin (NEURONTIN) 300 MG capsule, Take 300 mg by mouth in the morning and at bedtime., Disp: , Rfl:  ?  glipiZIDE (GLUCOTROL XL) 2.5 MG 24 hr tablet, Take 2.5 mg by mouth daily., Disp: , Rfl:  ?  hydrochlorothiazide (MICROZIDE) 12.5 MG capsule, Take 12.5 mg by mouth daily., Disp: , Rfl:  ?  lidocaine-prilocaine (EMLA) cream, Apply to affected area once, Disp: 30 g, Rfl: 3 ?  LORazepam (ATIVAN) 0.5 MG tablet, Take 1 tablet (0.5 mg total) by mouth every 6 (six) hours as needed (Nausea or vomiting)., Disp: 30 tablet, Rfl: 0 ?  meloxicam (MOBIC) 15 MG tablet, Take 15 mg by mouth daily as needed for pain., Disp: , Rfl:  ?  metoprolol succinate (TOPROL XL) 25 MG 24 hr tablet, Take 1 tablet (25 mg total) by mouth daily., Disp: 90 tablet, Rfl: 3 ?  ondansetron (ZOFRAN) 8 MG tablet, Take 1 tablet (8 mg total) by mouth 2 (two) times daily as needed for refractory nausea / vomiting. Start on day 3 after chemo., Disp: 30 tablet, Rfl: 1 ?  prochlorperazine (COMPAZINE) 10 MG tablet, Take 1 tablet (10 mg total) by mouth every 6 (six) hours as needed (Nausea or vomiting)., Disp: 30 tablet, Rfl: 1 ?  tiZANidine  (ZANAFLEX) 4 MG tablet, Take 4 mg by mouth every 8 (eight) hours as needed for muscle spasms., Disp: , Rfl:  ?  traMADol (ULTRAM) 50 MG tablet, Take 1 tablet (50 mg total) by mouth every 6 (six) hours as needed., Disp: 10 tab

## 2022-03-07 LAB — T4: T4, Total: 8.4 ug/dL (ref 4.5–12.0)

## 2022-03-07 NOTE — Anesthesia Postprocedure Evaluation (Signed)
Anesthesia Post Note ? ?Patient: Susan [name] ? ?Procedure(s) Performed: INSERTION PORT-A-CATH (Chest) ? ?Patient location during evaluation: PACU ?Anesthesia Type: General ?Level of consciousness: awake and alert ?Pain management: pain level controlled ?Vital Signs Assessment: post-procedure vital signs reviewed and stable ?Respiratory status: spontaneous breathing, nonlabored ventilation, respiratory function stable and patient connected to nasal cannula oxygen ?Cardiovascular status: blood pressure returned to baseline and stable ?Postop Assessment: no apparent nausea or vomiting ?Anesthetic complications: no ? ? ?No notable events documented. ? ? ?Last Vitals:  ?Vitals:  ? 03/01/22 1008 03/01/22 1208  ?BP: (!) 156/73 (!) 120/57  ?Pulse: 68 66  ?Resp: 18 18  ?Temp: 36.6 ?C (!) 36.1 ?C  ?SpO2: 99% 98%  ?  ?Last Pain:  ?Vitals:  ? 03/01/22 1208  ?TempSrc: Temporal  ?PainSc: 0-No pain  ? ? ?  ?  ?  ?  ?  ?  ? ?Molli Barrows ? ? ? ? ?

## 2022-03-13 ENCOUNTER — Telehealth: Payer: Self-pay | Admitting: Licensed Clinical Social Worker

## 2022-03-13 ENCOUNTER — Inpatient Hospital Stay: Payer: Medicare HMO

## 2022-03-13 ENCOUNTER — Encounter: Payer: Self-pay | Admitting: Licensed Clinical Social Worker

## 2022-03-13 VITALS — BP 137/55 | HR 74 | Temp 97.0°F | Resp 19

## 2022-03-13 DIAGNOSIS — Z5112 Encounter for antineoplastic immunotherapy: Secondary | ICD-10-CM | POA: Diagnosis not present

## 2022-03-13 DIAGNOSIS — C50311 Malignant neoplasm of lower-inner quadrant of right female breast: Secondary | ICD-10-CM

## 2022-03-13 LAB — COMPREHENSIVE METABOLIC PANEL WITH GFR
ALT: 24 U/L (ref 0–44)
AST: 21 U/L (ref 15–41)
Albumin: 4.1 g/dL (ref 3.5–5.0)
Alkaline Phosphatase: 65 U/L (ref 38–126)
Anion gap: 8 (ref 5–15)
BUN: 24 mg/dL — ABNORMAL HIGH (ref 8–23)
CO2: 28 mmol/L (ref 22–32)
Calcium: 8.9 mg/dL (ref 8.9–10.3)
Chloride: 98 mmol/L (ref 98–111)
Creatinine, Ser: 0.76 mg/dL (ref 0.44–1.00)
GFR, Estimated: >60 mL/min
Glucose, Bld: 234 mg/dL — ABNORMAL HIGH (ref 70–99)
Potassium: 3.9 mmol/L (ref 3.5–5.1)
Sodium: 134 mmol/L — ABNORMAL LOW (ref 135–145)
Total Bilirubin: 0.5 mg/dL (ref 0.3–1.2)
Total Protein: 7.1 g/dL (ref 6.5–8.1)

## 2022-03-13 LAB — CBC WITH DIFFERENTIAL/PLATELET
Abs Immature Granulocytes: 0.02 K/uL (ref 0.00–0.07)
Basophils Absolute: 0 K/uL (ref 0.0–0.1)
Basophils Relative: 1 %
Eosinophils Absolute: 0.1 K/uL (ref 0.0–0.5)
Eosinophils Relative: 3 %
HCT: 35.4 % — ABNORMAL LOW (ref 36.0–46.0)
Hemoglobin: 12 g/dL (ref 12.0–15.0)
Immature Granulocytes: 0 %
Lymphocytes Relative: 32 %
Lymphs Abs: 1.5 K/uL (ref 0.7–4.0)
MCH: 32.7 pg (ref 26.0–34.0)
MCHC: 33.9 g/dL (ref 30.0–36.0)
MCV: 96.5 fL (ref 80.0–100.0)
Monocytes Absolute: 0.3 K/uL (ref 0.1–1.0)
Monocytes Relative: 6 %
Neutro Abs: 2.8 K/uL (ref 1.7–7.7)
Neutrophils Relative %: 58 %
Platelets: 329 K/uL (ref 150–400)
RBC: 3.67 MIL/uL — ABNORMAL LOW (ref 3.87–5.11)
RDW: 12.9 % (ref 11.5–15.5)
WBC: 4.8 K/uL (ref 4.0–10.5)
nRBC: 0 % (ref 0.0–0.2)

## 2022-03-13 LAB — TSH: TSH: 1.497 u[IU]/mL (ref 0.350–4.500)

## 2022-03-13 MED ORDER — HEPARIN SOD (PORK) LOCK FLUSH 100 UNIT/ML IV SOLN
INTRAVENOUS | Status: AC
  Administered 2022-03-13: 500 [IU]
  Filled 2022-03-13: qty 5

## 2022-03-13 MED ORDER — SODIUM CHLORIDE 0.9 % IV SOLN
10.0000 mg | Freq: Once | INTRAVENOUS | Status: AC
  Administered 2022-03-13: 10 mg via INTRAVENOUS
  Filled 2022-03-13: qty 10

## 2022-03-13 MED ORDER — DIPHENHYDRAMINE HCL 50 MG/ML IJ SOLN
50.0000 mg | Freq: Once | INTRAMUSCULAR | Status: AC
  Administered 2022-03-13: 50 mg via INTRAVENOUS
  Filled 2022-03-13: qty 1

## 2022-03-13 MED ORDER — FAMOTIDINE IN NACL 20-0.9 MG/50ML-% IV SOLN
20.0000 mg | Freq: Once | INTRAVENOUS | Status: AC
  Administered 2022-03-13: 20 mg via INTRAVENOUS
  Filled 2022-03-13: qty 50

## 2022-03-13 MED ORDER — HEPARIN SOD (PORK) LOCK FLUSH 100 UNIT/ML IV SOLN
500.0000 [IU] | Freq: Once | INTRAVENOUS | Status: AC | PRN
  Filled 2022-03-13: qty 5

## 2022-03-13 MED ORDER — PALONOSETRON HCL INJECTION 0.25 MG/5ML
0.2500 mg | Freq: Once | INTRAVENOUS | Status: AC
  Administered 2022-03-13: 0.25 mg via INTRAVENOUS
  Filled 2022-03-13: qty 5

## 2022-03-13 MED ORDER — SODIUM CHLORIDE 0.9 % IV SOLN
80.0000 mg/m2 | Freq: Once | INTRAVENOUS | Status: AC
  Administered 2022-03-13: 126 mg via INTRAVENOUS
  Filled 2022-03-13: qty 21

## 2022-03-13 MED ORDER — SODIUM CHLORIDE 0.9 % IV SOLN
Freq: Once | INTRAVENOUS | Status: AC
  Filled 2022-03-13: qty 250

## 2022-03-13 MED ORDER — SODIUM CHLORIDE 0.9 % IV SOLN
110.8500 mg | Freq: Once | INTRAVENOUS | Status: AC
  Administered 2022-03-13: 110 mg via INTRAVENOUS
  Filled 2022-03-13: qty 11

## 2022-03-13 NOTE — Progress Notes (Signed)
[facility] CSW Progress Note ? ?Clinical Social Worker contacted caregiver by phone to remind her of the Spanish Speaking support group on 03/23/2022. ? ? ? ?Iza Preston , LCSW ?

## 2022-03-13 NOTE — Patient Instructions (Signed)
[facility] [place]  Discharge Instructions: Thank you for choosing [facility] to provide your oncology and hematology care.  If you have a lab appointment with the Cancer Center, please go directly to the Cancer Center and check in at the registration area.  Wear comfortable clothing and clothing appropriate for easy access to any Portacath or PICC line.   We strive to give you quality time with your provider. You may need to reschedule your appointment if you arrive late (15 or more minutes).  Arriving late affects you and other patients whose appointments are after yours.  Also, if you miss three or more appointments without notifying the office, you may be dismissed from the clinic at the provider's discretion.      For prescription refill requests, have your pharmacy contact our office and allow 72 hours for refills to be completed.    Today you received the following chemotherapy and/or immunotherapy agents Taxol, Carboplatin    To help prevent nausea and vomiting after your treatment, we encourage you to take your nausea medication as directed.  BELOW ARE SYMPTOMS THAT SHOULD BE REPORTED IMMEDIATELY: *FEVER GREATER THAN 100.4 F (38 C) OR HIGHER *CHILLS OR SWEATING *NAUSEA AND VOMITING THAT IS NOT CONTROLLED WITH YOUR NAUSEA MEDICATION *UNUSUAL SHORTNESS OF BREATH *UNUSUAL BRUISING OR BLEEDING *URINARY PROBLEMS (pain or burning when urinating, or frequent urination) *BOWEL PROBLEMS (unusual diarrhea, constipation, pain near the anus) TENDERNESS IN MOUTH AND THROAT WITH OR WITHOUT PRESENCE OF ULCERS (sore throat, sores in mouth, or a toothache) UNUSUAL RASH, SWELLING OR PAIN  UNUSUAL VAGINAL DISCHARGE OR ITCHING   Items with * indicate a potential emergency and should be followed up as soon as possible or go to the Emergency Department if any problems should occur.  Please show the CHEMOTHERAPY ALERT CARD or IMMUNOTHERAPY ALERT CARD at  check-in to the Emergency Department and triage nurse.  Should you have questions after your visit or need to cancel or reschedule your appointment, please contact [facility]  [phone] and follow the prompts.  Office hours are 8:00 a.m. to 4:30 p.m. Monday - Friday. Please note that voicemails left after 4:00 p.m. may not be returned until the following business day.  We are closed weekends and major holidays. You have access to a nurse at all times for urgent questions. Please call the main number to the clinic [phone] and follow the prompts.  For any non-urgent questions, you may also contact your provider using MyChart. We now offer e-Visits for anyone 18 and older to request care online for non-urgent symptoms. For details visit [email or web address].   Also download the MyChart app! Go to the app store, search "MyChart", open the app, select [place], and log in with your MyChart username and password.  Due to Covid, a mask is required upon entering the hospital/clinic. If you do not have a mask, one will be given to you upon arrival. For doctor visits, patients may have 1 support person aged 18 or older with them. For treatment visits, patients cannot have anyone with them due to current Covid guidelines and our immunocompromised population.  

## 2022-03-14 LAB — T4: T4, Total: 8.2 ug/dL (ref 4.5–12.0)

## 2022-03-14 NOTE — Telephone Encounter (Signed)
Unable to contact deleting recall.  ?

## 2022-03-16 NOTE — Telephone Encounter (Signed)
Noted     Closing encounter

## 2022-03-20 ENCOUNTER — Encounter: Payer: Self-pay | Admitting: Oncology

## 2022-03-20 ENCOUNTER — Inpatient Hospital Stay: Payer: Medicare HMO

## 2022-03-20 ENCOUNTER — Inpatient Hospital Stay (HOSPITAL_BASED_OUTPATIENT_CLINIC_OR_DEPARTMENT_OTHER): Payer: Medicare HMO | Admitting: Oncology

## 2022-03-20 VITALS — BP 126/54 | HR 77 | Temp 96.6°F | Resp 18 | Wt 123.4 lb

## 2022-03-20 DIAGNOSIS — Z5112 Encounter for antineoplastic immunotherapy: Secondary | ICD-10-CM | POA: Diagnosis not present

## 2022-03-20 DIAGNOSIS — Z5111 Encounter for antineoplastic chemotherapy: Secondary | ICD-10-CM

## 2022-03-20 DIAGNOSIS — C50311 Malignant neoplasm of lower-inner quadrant of right female breast: Secondary | ICD-10-CM

## 2022-03-20 DIAGNOSIS — Z171 Estrogen receptor negative status [ER-]: Secondary | ICD-10-CM

## 2022-03-20 LAB — COMPREHENSIVE METABOLIC PANEL
ALT: 23 U/L (ref 0–44)
AST: 23 U/L (ref 15–41)
Albumin: 3.8 g/dL (ref 3.5–5.0)
Alkaline Phosphatase: 53 U/L (ref 38–126)
Anion gap: 10 (ref 5–15)
BUN: 22 mg/dL (ref 8–23)
CO2: 26 mmol/L (ref 22–32)
Calcium: 9 mg/dL (ref 8.9–10.3)
Chloride: 98 mmol/L (ref 98–111)
Creatinine, Ser: 0.71 mg/dL (ref 0.44–1.00)
GFR, Estimated: >60 mL/min (ref >60)
Glucose, Bld: 256 mg/dL — ABNORMAL HIGH (ref 70–99)
Potassium: 4.4 mmol/L (ref 3.5–5.1)
Sodium: 134 mmol/L — ABNORMAL LOW (ref 135–145)
Total Bilirubin: 0.5 mg/dL (ref 0.3–1.2)
Total Protein: 6.5 g/dL (ref 6.5–8.1)

## 2022-03-20 LAB — CBC WITH DIFFERENTIAL/PLATELET
Abs Immature Granulocytes: 0.03 10*3/uL (ref 0.00–0.07)
Basophils Absolute: 0.1 10*3/uL (ref 0.0–0.1)
Basophils Relative: 1 %
Eosinophils Absolute: 0.1 10*3/uL (ref 0.0–0.5)
Eosinophils Relative: 2 %
HCT: 32.6 % — ABNORMAL LOW (ref 36.0–46.0)
Hemoglobin: 11.1 g/dL — ABNORMAL LOW (ref 12.0–15.0)
Immature Granulocytes: 1 %
Lymphocytes Relative: 30 %
Lymphs Abs: 1.5 10*3/uL (ref 0.7–4.0)
MCH: 32.6 pg (ref 26.0–34.0)
MCHC: 34 g/dL (ref 30.0–36.0)
MCV: 95.9 fL (ref 80.0–100.0)
Monocytes Absolute: 0.2 10*3/uL (ref 0.1–1.0)
Monocytes Relative: 5 %
Neutro Abs: 3.1 10*3/uL (ref 1.7–7.7)
Neutrophils Relative %: 61 %
Platelets: 256 10*3/uL (ref 150–400)
RBC: 3.4 MIL/uL — ABNORMAL LOW (ref 3.87–5.11)
RDW: 12.8 % (ref 11.5–15.5)
WBC: 4.9 10*3/uL (ref 4.0–10.5)
nRBC: 0 % (ref 0.0–0.2)

## 2022-03-20 MED ORDER — PALONOSETRON HCL INJECTION 0.25 MG/5ML
0.2500 mg | Freq: Once | INTRAVENOUS | Status: AC
  Administered 2022-03-20: 0.25 mg via INTRAVENOUS
  Filled 2022-03-20: qty 5

## 2022-03-20 MED ORDER — SODIUM CHLORIDE 0.9 % IV SOLN
10.0000 mg | Freq: Once | INTRAVENOUS | Status: AC
  Administered 2022-03-20: 10 mg via INTRAVENOUS
  Filled 2022-03-20: qty 10

## 2022-03-20 MED ORDER — SODIUM CHLORIDE 0.9 % IV SOLN
Freq: Once | INTRAVENOUS | Status: AC
  Filled 2022-03-20: qty 250

## 2022-03-20 MED ORDER — HEPARIN SOD (PORK) LOCK FLUSH 100 UNIT/ML IV SOLN
INTRAVENOUS | Status: AC
  Administered 2022-03-20: 500 [IU]
  Filled 2022-03-20: qty 5

## 2022-03-20 MED ORDER — HEPARIN SOD (PORK) LOCK FLUSH 100 UNIT/ML IV SOLN
500.0000 [IU] | Freq: Once | INTRAVENOUS | Status: AC | PRN
  Filled 2022-03-20: qty 5

## 2022-03-20 MED ORDER — DIPHENHYDRAMINE HCL 50 MG/ML IJ SOLN
50.0000 mg | Freq: Once | INTRAMUSCULAR | Status: AC
  Administered 2022-03-20: 50 mg via INTRAVENOUS
  Filled 2022-03-20: qty 1

## 2022-03-20 MED ORDER — SODIUM CHLORIDE 0.9 % IV SOLN
200.0000 mg | Freq: Once | INTRAVENOUS | Status: AC
  Administered 2022-03-20: 200 mg via INTRAVENOUS
  Filled 2022-03-20: qty 200

## 2022-03-20 MED ORDER — SODIUM CHLORIDE 0.9 % IV SOLN
80.0000 mg/m2 | Freq: Once | INTRAVENOUS | Status: AC
  Administered 2022-03-20: 126 mg via INTRAVENOUS
  Filled 2022-03-20: qty 21

## 2022-03-20 MED ORDER — SODIUM CHLORIDE 0.9 % IV SOLN
110.8500 mg | Freq: Once | INTRAVENOUS | Status: AC
  Administered 2022-03-20: 110 mg via INTRAVENOUS
  Filled 2022-03-20: qty 11

## 2022-03-20 MED ORDER — FAMOTIDINE IN NACL 20-0.9 MG/50ML-% IV SOLN
20.0000 mg | Freq: Once | INTRAVENOUS | Status: AC
  Administered 2022-03-20: 20 mg via INTRAVENOUS
  Filled 2022-03-20: qty 50

## 2022-03-20 NOTE — Progress Notes (Signed)
? ? ? ?Hematology/Oncology Consult note ?[place]  ?[phone] [phone] ? ?Patient Care Team: ?Frazier Richards, MD as PCP - General (Family Medicine) ?Kate Sable, MD as PCP - Cardiology (Cardiology) ?Theodore Demark, RN as Oncology Nurse Navigator ?Sindy Guadeloupe, MD as Consulting Physician (Oncology)  ? ?Name of the patient: Susan [name]  ?[id number]  ?1956/08/18  ? ?Date of visit: 03/20/22 ? 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  ? ?Chief complaint/ Reason for visit-on treatment assessment prior to cycle 4 of neoadjuvant CarboTaxol Keytruda ? ?Heme/Onc history: Patient is a 66 year old female who self palpated a right breast lump in February 2023 that led to a diagnostic right breast mammogram.  It showed a suspicious mass at the 5:30 position of the right breast 4 cm from the nipple measuring 1.8 x 1.6 x 1.2 cm.  There may be a thickened echogenic rim which would bring the measurement to 2.9 x 1.9 x 2.3 cm.  Overlying skin thickening.  No axillary adenopathy.  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  Patient referred for further management.  History obtained with the help of Spanish interpreter.  Patient is independent of her ADLs at baseline.She reports some baseline diabetic neuropathy for which she is on gabapentin. ?  ?Family history significant for breast cancer in her sister  in her 69s.  She is here with her daughter today.  Reports her appetite and weight haveRemained stable.  She is anxious about her next steps with regards to breast cancer. ?  ?Patient is being treated with neoadjuvant intent with keynote 522 regimen starting with CarboTaxol Keytruda chemotherapy on 02/27/2022 ? ?Interval history-history obtained with the help of Spanish interpreter.  Right-sided breast pain as well as  size of the breast mass has decreased.  Patient is tolerating chemotherapy well without any significant side effects. ? ?ECOG PS- 1 ?Pain scale- 0 ? ? ?Review of systems- Review of Systems  ?Constitutional:  Negative for chills, fever, malaise/fatigue and weight loss.  ?HENT:  Negative for congestion, ear discharge and nosebleeds.   ?Eyes:  Negative for blurred vision.  ?Respiratory:  Negative for cough, hemoptysis, sputum production, shortness of breath and wheezing.   ?Cardiovascular:  Negative for chest pain, palpitations, orthopnea and claudication.  ?Gastrointestinal:  Negative for abdominal pain, blood in stool, constipation, diarrhea, heartburn, melena, nausea and vomiting.  ?Genitourinary:  Negative for dysuria, flank pain, frequency, hematuria and urgency.  ?Musculoskeletal:  Negative for back pain, joint pain and myalgias.  ?Skin:  Negative for rash.  ?Neurological:  Negative for dizziness, tingling, focal weakness, seizures, weakness and headaches.  ?Endo/Heme/Allergies:  Does not bruise/bleed easily.  ?Psychiatric/Behavioral:  Negative for depression and suicidal ideas. The patient does not have insomnia.    ? ? ? ?Allergies  ?Allergen Reactions  ? Contrast Media  [Iodinated Contrast Media] Nausea And Vomiting and Other (See Comments)  ? ? ? ?Past Medical History:  ?Diagnosis Date  ? Breast cancer, right breast ([place])   ? Diabetes mellitus without complication ([place])   ? Family history of prostate cancer   ? Gastritis   ? Heart murmur   ? History of pancreatitis   ? Hyperlipidemia   ? Hypertension   ? Palpitations   ? ? ? ?Past Surgical History:  ?Procedure Laterality Date  ? BREAST BIOPSY Right 11/2015  ?  benign  ? BREAST BIOPSY Right 01/24/2022  ? INVASIVE MAMMARY CARCINOMA, NO SPECIAL TYPE  ? BREAST CYST EXCISION Right   ? CESAREAN SECTION    ? 2x  ? CHOLECYSTECTOMY    ? PORTACATH PLACEMENT N/A 03/01/2022  ? Procedure: INSERTION PORT-A-CATH;  Surgeon: Herbert Pun, MD;  Location: [facility] ORS;   Service: General;  Laterality: N/A;  ? ? ?Social History  ? ?Socioeconomic History  ? Marital status: Married  ?  Spouse name: Not on file  ? Number of children: Not on file  ? Years of education: Not on file  ? Highest education level: Not on file  ?Occupational History  ? Not on file  ?Tobacco Use  ? Smoking status: Never  ? Smokeless tobacco: Never  ?Vaping Use  ? Vaping Use: Never used  ?Substance and Sexual Activity  ? Alcohol use: Never  ? Drug use: Never  ? Sexual activity: Not on file  ?Other Topics Concern  ? Not on file  ?Social History Narrative  ? Not on file  ? ?Social Determinants of Health  ? ?Financial Resource Strain: Low Risk   ? Difficulty of Paying Living Expenses: Not very hard  ?Food Insecurity: No Food Insecurity  ? Worried About Charity fundraiser in the Last Year: Never true  ? Ran Out of Food in the Last Year: Never true  ?Transportation Needs: No Transportation Needs  ? Lack of Transportation (Medical): No  ? Lack of Transportation (Non-Medical): No  ?Physical Activity: Inactive  ? Days of Exercise per Week: 0 days  ? Minutes of Exercise per Session: 0 min  ?Stress: No Stress Concern Present  ? Feeling of Stress : Only a little  ?Social Connections: Moderately Integrated  ? Frequency of Communication with Friends and Family: Three times a week  ? Frequency of Social Gatherings with Friends and Family: Three times a week  ? Attends Religious Services: 1 to 4 times per year  ? Active Member of Clubs or Organizations: No  ? Attends Archivist Meetings: Never  ? Marital Status: Married  ?Intimate Partner Violence: Not At Risk  ? Fear of Current or Ex-Partner: No  ? Emotionally Abused: No  ? Physically Abused: No  ? Sexually Abused: No  ? ? ?Family History  ?Problem Relation Age of Onset  ? Heart disease Father   ? Breast cancer Sister   ?     67s  ? Prostate cancer Paternal Grandfather   ? Stomach cancer Cousin   ? ? ? ?Current Outpatient Medications:  ?  atorvastatin (LIPITOR) 80  MG tablet, Take 80 mg by mouth daily., Disp: , Rfl:  ?  DULoxetine (CYMBALTA) 60 MG capsule, Take 60 mg by mouth daily., Disp: , Rfl:  ?  enalapril (VASOTEC) 20 MG tablet, Take 20 mg by mouth daily., Disp: , Rfl:  ?  fenofibrate (TRICOR) 145 MG tablet, Take 145 mg by mouth daily., Disp: , Rfl:  ?  fluticasone (FLONASE) 50 MCG/ACT nasal spray, Place 2 sprays into both nostrils daily as needed for allergies or rhinitis., Disp: , Rfl:  ?  gabapentin (NEURONTIN) 300 MG capsule, Take 300 mg by mouth in the morning and at bedtime., Disp: , Rfl:  ?  glipiZIDE (GLUCOTROL XL) 2.5 MG 24 hr tablet, Take 2.5 mg by mouth daily., Disp: , Rfl:  ?  hydrochlorothiazide (MICROZIDE) 12.5 MG capsule, Take 12.5 mg by mouth daily., Disp: , Rfl:  ?  metoprolol succinate (TOPROL XL) 25 MG 24  hr tablet, Take 1 tablet (25 mg total) by mouth daily., Disp: 90 tablet, Rfl: 3 ?  aspirin 81 MG EC tablet, Take 1 tablet by mouth daily. (Patient not taking: Reported on 03/06/2022), Disp: , Rfl:  ?  atorvastatin (LIPITOR) 40 MG tablet, Take by mouth. (Patient not taking: Reported on 03/06/2022), Disp: , Rfl:  ?  dexamethasone (DECADRON) 4 MG tablet, Take 2 tablets (8 mg total) by mouth daily. Start the day after chemotherapy for 2 days. (Patient not taking: Reported on 03/20/2022), Disp: 30 tablet, Rfl: 1 ?  lidocaine-prilocaine (EMLA) cream, Apply to affected area once (Patient not taking: Reported on 03/06/2022), Disp: 30 g, Rfl: 3 ?  LORazepam (ATIVAN) 0.5 MG tablet, Take 1 tablet (0.5 mg total) by mouth every 6 (six) hours as needed (Nausea or vomiting). (Patient not taking: Reported on 03/06/2022), Disp: 30 tablet, Rfl: 0 ?  meloxicam (MOBIC) 15 MG tablet, Take 15 mg by mouth daily as needed for pain. (Patient not taking: Reported on 03/20/2022), Disp: , Rfl:  ?  ondansetron (ZOFRAN) 8 MG tablet, Take 1 tablet (8 mg total) by mouth 2 (two) times daily as needed for refractory nausea / vomiting. Start on day 3 after chemo. (Patient not taking: Reported on  03/20/2022), Disp: 30 tablet, Rfl: 1 ?  prochlorperazine (COMPAZINE) 10 MG tablet, Take 1 tablet (10 mg total) by mouth every 6 (six) hours as needed (Nausea or vomiting). (Patient not taking: Reported on 4/3/

## 2022-03-20 NOTE — Progress Notes (Signed)
Pt states that this morning she blew her nose and some blood come up does not happen often.  ?

## 2022-03-20 NOTE — Patient Instructions (Signed)
[facility] AT [place]  Discharge Instructions: ?Thank you for choosing [facility] to provide your oncology and hematology care.  ?If you have a lab appointment with the [facility], please go directly to the [facility] and check in at the registration area. ? ?Wear comfortable clothing and clothing appropriate for easy access to any Portacath or PICC line.  ? ?We strive to give you quality time with your provider. You may need to reschedule your appointment if you arrive late (15 or more minutes).  Arriving late affects you and other patients whose appointments are after yours.  Also, if you miss three or more appointments without notifying the office, you may be dismissed from the clinic at the provider?s discretion.    ?  ?For prescription refill requests, have your pharmacy contact our office and allow 72 hours for refills to be completed.   ? ?Today you received the following chemotherapy and/or immunotherapy agents Keytruda, Taxol, Carboplatin  ?  ?To help prevent nausea and vomiting after your treatment, we encourage you to take your nausea medication as directed. ? ?BELOW ARE SYMPTOMS THAT SHOULD BE REPORTED IMMEDIATELY: ?*FEVER GREATER THAN 100.4 F (38 ?C) OR HIGHER ?*CHILLS OR SWEATING ?*NAUSEA AND VOMITING THAT IS NOT CONTROLLED WITH YOUR NAUSEA MEDICATION ?*UNUSUAL SHORTNESS OF BREATH ?*UNUSUAL BRUISING OR BLEEDING ?*URINARY PROBLEMS (pain or burning when urinating, or frequent urination) ?*BOWEL PROBLEMS (unusual diarrhea, constipation, pain near the anus) ?TENDERNESS IN MOUTH AND THROAT WITH OR WITHOUT PRESENCE OF ULCERS (sore throat, sores in mouth, or a toothache) ?UNUSUAL RASH, SWELLING OR PAIN  ?UNUSUAL VAGINAL DISCHARGE OR ITCHING  ? ?Items with * indicate a potential emergency and should be followed up as soon as possible or go to the Emergency Department if any problems should occur. ? ?Please show the CHEMOTHERAPY ALERT CARD or IMMUNOTHERAPY ALERT CARD  at check-in to the Emergency Department and triage nurse. ? ?Should you have questions after your visit or need to cancel or reschedule your appointment, please contact [facility] [place] AT [place]  [phone] and follow the prompts.  Office hours are 8:00 a.m. to 4:30 p.m. Monday - Friday. Please note that voicemails left after 4:00 p.m. may not be returned until the following business day.  We are closed weekends and major holidays. You have access to a nurse at all times for urgent questions. Please call the main number to the clinic [phone] and follow the prompts. ? ?For any non-urgent questions, you may also contact your provider using MyChart. We now offer e-Visits for anyone 26 and older to request care online for non-urgent symptoms. For details visit [email or web address]. ?  ?Also download the MyChart app! Go to the app store, search "MyChart", open the app, select [place], and log in with your MyChart username and password. ? ?Due to Covid, a mask is required upon entering the hospital/clinic. If you do not have a mask, one will be given to you upon arrival. For doctor visits, patients may have 1 support person aged 86 or older with them. For treatment visits, patients cannot have anyone with them due to current Covid guidelines and our immunocompromised population.  ?

## 2022-03-27 ENCOUNTER — Ambulatory Visit: Admission: RE | Admit: 2022-03-27 | Payer: Medicare HMO | Source: Ambulatory Visit | Admitting: Radiology

## 2022-03-27 ENCOUNTER — Inpatient Hospital Stay: Payer: Medicare HMO

## 2022-03-27 ENCOUNTER — Encounter: Payer: Self-pay | Admitting: Oncology

## 2022-03-27 ENCOUNTER — Other Ambulatory Visit: Payer: Self-pay | Admitting: *Deleted

## 2022-03-27 ENCOUNTER — Telehealth: Payer: Self-pay

## 2022-03-27 ENCOUNTER — Telehealth: Payer: Self-pay | Admitting: *Deleted

## 2022-03-27 VITALS — BP 141/62 | HR 78 | Temp 98.2°F | Resp 18 | Wt 126.0 lb

## 2022-03-27 DIAGNOSIS — Z171 Estrogen receptor negative status [ER-]: Secondary | ICD-10-CM

## 2022-03-27 DIAGNOSIS — Z452 Encounter for adjustment and management of vascular access device: Secondary | ICD-10-CM

## 2022-03-27 DIAGNOSIS — Z5112 Encounter for antineoplastic immunotherapy: Secondary | ICD-10-CM | POA: Diagnosis not present

## 2022-03-27 LAB — CBC WITH DIFFERENTIAL/PLATELET
Abs Immature Granulocytes: 0.02 10*3/uL (ref 0.00–0.07)
Basophils Absolute: 0 10*3/uL (ref 0.0–0.1)
Basophils Relative: 1 %
Eosinophils Absolute: 0.1 10*3/uL (ref 0.0–0.5)
Eosinophils Relative: 1 %
HCT: 31.6 % — ABNORMAL LOW (ref 36.0–46.0)
Hemoglobin: 10.6 g/dL — ABNORMAL LOW (ref 12.0–15.0)
Immature Granulocytes: 0 %
Lymphocytes Relative: 29 %
Lymphs Abs: 1.3 10*3/uL (ref 0.7–4.0)
MCH: 32.5 pg (ref 26.0–34.0)
MCHC: 33.5 g/dL (ref 30.0–36.0)
MCV: 96.9 fL (ref 80.0–100.0)
Monocytes Absolute: 0.3 10*3/uL (ref 0.1–1.0)
Monocytes Relative: 6 %
Neutro Abs: 2.8 10*3/uL (ref 1.7–7.7)
Neutrophils Relative %: 63 %
Platelets: 226 10*3/uL (ref 150–400)
RBC: 3.26 MIL/uL — ABNORMAL LOW (ref 3.87–5.11)
RDW: 13.1 % (ref 11.5–15.5)
WBC: 4.5 10*3/uL (ref 4.0–10.5)
nRBC: 0 % (ref 0.0–0.2)

## 2022-03-27 LAB — COMPREHENSIVE METABOLIC PANEL
ALT: 23 U/L (ref 0–44)
AST: 22 U/L (ref 15–41)
Albumin: 3.7 g/dL (ref 3.5–5.0)
Alkaline Phosphatase: 48 U/L (ref 38–126)
Anion gap: 7 (ref 5–15)
BUN: 26 mg/dL — ABNORMAL HIGH (ref 8–23)
CO2: 27 mmol/L (ref 22–32)
Calcium: 8.8 mg/dL — ABNORMAL LOW (ref 8.9–10.3)
Chloride: 103 mmol/L (ref 98–111)
Creatinine, Ser: 0.62 mg/dL (ref 0.44–1.00)
GFR, Estimated: >60 mL/min (ref >60)
Glucose, Bld: 257 mg/dL — ABNORMAL HIGH (ref 70–99)
Potassium: 4.2 mmol/L (ref 3.5–5.1)
Sodium: 137 mmol/L (ref 135–145)
Total Bilirubin: 0.4 mg/dL (ref 0.3–1.2)
Total Protein: 6.4 g/dL — ABNORMAL LOW (ref 6.5–8.1)

## 2022-03-27 MED ORDER — SODIUM CHLORIDE 0.9 % IV SOLN
80.0000 mg/m2 | Freq: Once | INTRAVENOUS | Status: AC
  Administered 2022-03-27: 126 mg via INTRAVENOUS
  Filled 2022-03-27: qty 21

## 2022-03-27 MED ORDER — HEPARIN SOD (PORK) LOCK FLUSH 100 UNIT/ML IV SOLN
500.0000 [IU] | Freq: Once | INTRAVENOUS | Status: AC
  Administered 2022-03-27: 500 [IU] via INTRAVENOUS
  Filled 2022-03-27: qty 5

## 2022-03-27 MED ORDER — DIPHENHYDRAMINE HCL 50 MG/ML IJ SOLN
50.0000 mg | Freq: Once | INTRAMUSCULAR | Status: AC
  Administered 2022-03-27: 50 mg via INTRAVENOUS
  Filled 2022-03-27: qty 1

## 2022-03-27 MED ORDER — SODIUM CHLORIDE 0.9 % IV SOLN
10.0000 mg | Freq: Once | INTRAVENOUS | Status: AC
  Administered 2022-03-27: 10 mg via INTRAVENOUS
  Filled 2022-03-27: qty 10

## 2022-03-27 MED ORDER — FAMOTIDINE IN NACL 20-0.9 MG/50ML-% IV SOLN
20.0000 mg | Freq: Once | INTRAVENOUS | Status: AC
  Administered 2022-03-27: 20 mg via INTRAVENOUS
  Filled 2022-03-27: qty 50

## 2022-03-27 MED ORDER — SODIUM CHLORIDE 0.9 % IV SOLN
109.8000 mg | Freq: Once | INTRAVENOUS | Status: AC
  Administered 2022-03-27: 110 mg via INTRAVENOUS
  Filled 2022-03-27: qty 11

## 2022-03-27 MED ORDER — SODIUM CHLORIDE 0.9% FLUSH
10.0000 mL | Freq: Once | INTRAVENOUS | Status: AC
  Administered 2022-03-27: 10 mL via INTRAVENOUS
  Filled 2022-03-27: qty 10

## 2022-03-27 MED ORDER — PALONOSETRON HCL INJECTION 0.25 MG/5ML
0.2500 mg | Freq: Once | INTRAVENOUS | Status: AC
  Administered 2022-03-27: 0.25 mg via INTRAVENOUS
  Filled 2022-03-27: qty 5

## 2022-03-27 MED ORDER — SODIUM CHLORIDE 0.9 % IV SOLN
Freq: Once | INTRAVENOUS | Status: AC
  Filled 2022-03-27: qty 250

## 2022-03-27 NOTE — Telephone Encounter (Signed)
Patient was here to get treatment today and her port could barely draw any blood.  Patient got the blood peripherally.  NP went and looked at the patient and she is feeling sore as well as the patient cannot get any blood now from the port.  I said he was ordered.  We were trying to get it done today but with the patient having a allergy to the dye dye study we have to give her premeds before this.  The patient has to take 50 mg of prednisone at 1:30 in the morning tomorrow.,  Then take another 50 mg of  prednisone at 7:30 in the morning, and then 1:30 PM tomorrow she will need to take another 50 mg of prednisone along with 50 mg of Benadryl.  Then she comes over to the medical mall at 230 and registers so that she can get the dye study at that time.  Patient has been given these instructions as well as calling the pharmacy at [place] [facility] and we spoke to a [place] ; she stated that the patient brought her a copy of the prescription also they will be giving her her medicine today and she will be able to get her dye study tomorrow.  While in the clinic the patient was agreeable to keep her port needle in so that they would not have to stick her tomorrow. ?

## 2022-03-27 NOTE — Telephone Encounter (Signed)
Called to inform staff patient will be running late for port dye study, chemo still infusing. ?

## 2022-03-27 NOTE — Progress Notes (Signed)
Patient here for carbo/taxol & port had very little blood return had to get labs peripherally and patient stated it been hurting for a few days. No signs of bruising or swelling. MD notified. ?

## 2022-03-27 NOTE — Progress Notes (Signed)
Patient tolerated carbo/ taxol infusion via 22 g angio right AC. Patient home with [facility], for port dye study scheduled tomorrow 4/25. Patient was educated on appointment details. Interpreter used. No questions/concerns voiced. Patient stable at discharge. AVS given.   ?

## 2022-03-27 NOTE — Telephone Encounter (Signed)
Revealed negative genetic testing.   This normal result is reassuring and indicates that it is unlikely [name]'s cancer is due to a hereditary cause.  It is unlikely that there is an increased risk of another cancer due to a mutation in one of these genes.  However, genetic testing is not perfect, and cannot definitively rule out a hereditary cause.  It will be important for her to keep in contact with genetics to learn if any additional testing may be needed in the future.    ? ?

## 2022-03-27 NOTE — Patient Instructions (Signed)
[facility] CTR AT [place]  Discharge Instructions: ?Thank you for choosing [place] to provide your oncology and hematology care.  ?If you have a lab appointment with the [facility], please go directly to the [facility] and check in at the registration area. ? ?Wear comfortable clothing and clothing appropriate for easy access to any Portacath or PICC line.  ? ?We strive to give you quality time with your provider. You may need to reschedule your appointment if you arrive late (15 or more minutes).  Arriving late affects you and other patients whose appointments are after yours.  Also, if you miss three or more appointments without notifying the office, you may be dismissed from the clinic at the provider?s discretion.    ?  ?For prescription refill requests, have your pharmacy contact our office and allow 72 hours for refills to be completed.   ? ?Today you received the following chemotherapy and/or immunotherapy agents: carbo/taxol ?  ?To help prevent nausea and vomiting after your treatment, we encourage you to take your nausea medication as directed. ? ?BELOW ARE SYMPTOMS THAT SHOULD BE REPORTED IMMEDIATELY: ?*FEVER GREATER THAN 100.4 F (38 ?C) OR HIGHER ?*CHILLS OR SWEATING ?*NAUSEA AND VOMITING THAT IS NOT CONTROLLED WITH YOUR NAUSEA MEDICATION ?*UNUSUAL SHORTNESS OF BREATH ?*UNUSUAL BRUISING OR BLEEDING ?*URINARY PROBLEMS (pain or burning when urinating, or frequent urination) ?*BOWEL PROBLEMS (unusual diarrhea, constipation, pain near the anus) ?TENDERNESS IN MOUTH AND THROAT WITH OR WITHOUT PRESENCE OF ULCERS (sore throat, sores in mouth, or a toothache) ?UNUSUAL RASH, SWELLING OR PAIN  ?UNUSUAL VAGINAL DISCHARGE OR ITCHING  ? ?Items with * indicate a potential emergency and should be followed up as soon as possible or go to the Emergency Department if any problems should occur. ? ?Please show the CHEMOTHERAPY ALERT CARD or IMMUNOTHERAPY ALERT CARD at check-in to  the Emergency Department and triage nurse. ? ?Should you have questions after your visit or need to cancel or reschedule your appointment, please contact [facility] CANCER [place] AT [place]  [phone] and follow the prompts.  Office hours are 8:00 a.m. to 4:30 p.m. Monday - Friday. Please note that voicemails left after 4:00 p.m. may not be returned until the following business day.  We are closed weekends and major holidays. You have access to a nurse at all times for urgent questions. Please call the main number to the clinic [phone] and follow the prompts. ? ?For any non-urgent questions, you may also contact your provider using MyChart. We now offer e-Visits for anyone 38 and older to request care online for non-urgent symptoms. For details visit [email or web address]. ?  ?Also download the MyChart app! Go to the app store, search "MyChart", open the app, select [place], and log in with your MyChart username and password. ? ?Due to Covid, a mask is required upon entering the hospital/clinic. If you do not have a mask, one will be given to you upon arrival. For doctor visits, patients may have 1 support person aged 41 or older with them. For treatment visits, patients cannot have anyone with them due to current Covid guidelines and our immunocompromised population.  ?

## 2022-03-28 ENCOUNTER — Ambulatory Visit
Admission: RE | Admit: 2022-03-28 | Discharge: 2022-03-28 | Disposition: A | Discharge status: Home / Self Care | Payer: Medicare HMO | Source: Ambulatory Visit | Attending: Oncology | Admitting: Oncology

## 2022-03-28 ENCOUNTER — Other Ambulatory Visit: Payer: Self-pay | Admitting: Oncology

## 2022-03-28 ENCOUNTER — Ambulatory Visit: Payer: Self-pay | Admitting: Licensed Clinical Social Worker

## 2022-03-28 ENCOUNTER — Encounter: Payer: Self-pay | Admitting: Licensed Clinical Social Worker

## 2022-03-28 DIAGNOSIS — Z452 Encounter for adjustment and management of vascular access device: Secondary | ICD-10-CM

## 2022-03-28 DIAGNOSIS — Z1379 Encounter for other screening for genetic and chromosomal anomalies: Secondary | ICD-10-CM | POA: Insufficient documentation

## 2022-03-28 DIAGNOSIS — C50311 Malignant neoplasm of lower-inner quadrant of right female breast: Secondary | ICD-10-CM | POA: Diagnosis present

## 2022-03-28 DIAGNOSIS — Z171 Estrogen receptor negative status [ER-]: Secondary | ICD-10-CM

## 2022-03-28 HISTORY — PX: IR CV LINE INJECTION: IMG2294

## 2022-03-28 MED ORDER — IOHEXOL 350 MG/ML SOLN
3.0000 mL | Freq: Once | INTRAVENOUS | Status: AC | PRN
  Administered 2022-03-28: 3 mL via INTRAVENOUS
  Filled 2022-03-28: qty 5

## 2022-03-28 NOTE — Procedures (Signed)
?  Procedure:  Port injection under fluoro  ?Preprocedure diagnosis: Diagnoses of Malignant neoplasm of lower-inner quadrant of right breast of female, estrogen receptor negative ([place]) and Encounter for care related to Port-a-Cath were pertinent to this visit. ? ?Postprocedure diagnosis: same ?EBL:    minimal ?Complications:   none immediate ? ?See full dictation in [facility]. ? ?D. Arne Cleveland MD ?Main # [phone] ?Pager  [phone] ?Mobile [phone] ?  ? ?

## 2022-03-28 NOTE — Progress Notes (Signed)
HPI:  [name] was previously seen in the [facility] due to a personal and family history of cancer and concerns regarding a hereditary predisposition to cancer. Please refer to our prior cancer genetics clinic note for more information regarding our discussion, assessment and recommendations, at the time. [name]'s recent genetic test results were disclosed to her, as were recommendations warranted by these results. These results and recommendations are discussed in more detail below. ? ?CANCER HISTORY:  ?Oncology History  ?Malignant neoplasm of lower-inner quadrant of right breast of female, estrogen receptor negative ([place])  ?02/01/2022 Initial Diagnosis  ? Malignant neoplasm of lower-inner quadrant of right breast of female, estrogen receptor negative ([place]) ? ?  ?02/21/2022 Cancer Staging  ? Staging form: Breast, AJCC 8th Edition ?- Clinical stage from 02/21/2022: Stage IIB (cT2, cN0, cM0, G3, ER-, PR-, HER2-) - Signed by Sindy Guadeloupe, MD on 02/21/2022 ?Histologic grading system: 3 grade system ? ?  ?02/27/2022 -  Chemotherapy  ? Patient is on Treatment Plan : BREAST Pembrolizumab (200) D1 + Carboplatin (1.5) D1,8,15 + Paclitaxel (80) D1,8,15 q21d X 4 cycles / Pembrolizumab (200) D1 + AC D1 q21d x 4 cycles  ? ?  ?  ? Genetic Testing  ? Negative genetic testing. No pathogenic variants identified on the [facility] CustomNext+RNA panel. The report date is 03/10/2022. ? ?The CustomNext-Cancer+RNAinsight panel offered by [facility] includes sequencing and rearrangement analysis for the following 47 genes:  APC, ATM, AXIN2, BARD1, BMPR1A, BRCA1, BRCA2, BRIP1, CDH1, CDK4, CDKN2A, CHEK2, DICER1, EPCAM, GREM1, HOXB13, MEN1, MLH1, MSH2, MSH3, MSH6, MUTYH, NBN, NF1, NF2, NTHL1, PALB2, PMS2, POLD1, POLE, PTEN, RAD51C, RAD51D, RECQL, RET, SDHA, SDHAF2, SDHB, SDHC, SDHD, SMAD4, SMARCA4, STK11, TP53, TSC1, TSC2, and VHL.  RNA data is routinely analyzed for use in variant interpretation for all  genes. ?  ? ? ?FAMILY HISTORY:  ?We obtained a detailed, 4-generation family history.  Significant diagnoses are listed below: ?Family History  ?Problem Relation Age of Onset  ? Heart disease Father   ? Breast cancer Sister   ?     84s  ? Prostate cancer Paternal Grandfather   ? Stomach cancer Cousin   ? ?Susan [name] has 1 daughter, Susan [name], 82 and 1 son, 87. Patient had 3 sisters. One sister had breast cancer in her 38s and died in her 56s.  ?  ?[name]'s mother died in her 25s. She is unaware of any cancer diagnoses on this side of the family. ?  ?[name]'s father died in his 39s. A paternal cousin had stomach cancer. Paternal grandfather had prostate cancer. ?  ?[name] is unaware of previous family history of genetic testing for hereditary cancer risks. Patient's maternal ancestors are of [place] descent, and paternal ancestors are of [place] descent. There is no reported Ashkenazi Jewish ancestry. There is no known consanguinity. ?  ? ? ? ?GENETIC TEST RESULTS: Genetic testing reported out on 03/10/2022 through the [facility] CustomNext+RNA cancer panel found no pathogenic mutations.  ? ?The CustomNext-Cancer+RNAinsight panel offered by [facility] includes sequencing and rearrangement analysis for the following 47 genes:  APC, ATM, AXIN2, BARD1, BMPR1A, BRCA1, BRCA2, BRIP1, CDH1, CDK4, CDKN2A, CHEK2, DICER1, EPCAM, GREM1, HOXB13, MEN1, MLH1, MSH2, MSH3, MSH6, MUTYH, NBN, NF1, NF2, NTHL1, PALB2, PMS2, POLD1, POLE, PTEN, RAD51C, RAD51D, RECQL, RET, SDHA, SDHAF2, SDHB, SDHC, SDHD, SMAD4, SMARCA4, STK11, TP53, TSC1, TSC2, and VHL.  RNA data is routinely analyzed for use in  variant interpretation for all genes.  ? ?The test report has been scanned into EPIC and is located under the Molecular Pathology section of the Results Review tab.  A portion of the result report is included below for reference.  ? ? ? ?We discussed that because current genetic testing is not perfect, it is possible  there may be a gene mutation in one of these genes that current testing cannot detect, but that chance is small.  There could be another gene that has not yet been discovered, or that we have not yet tested, that is responsible for the cancer diagnoses in the family. It is also possible there is a hereditary cause for the cancer in the family that [name] did not inherit and therefore was not identified in her testing.  Therefore, it is important to remain in touch with cancer genetics in the future so that we can continue to offer [name] the most up to date genetic testing.  ? ?ADDITIONAL GENETIC TESTING: We discussed with [name] that her genetic testing was fairly extensive.  If there are genes identified to increase cancer risk that can be analyzed in the future, we would be happy to discuss and coordinate this testing at that time.   ? ?CANCER SCREENING RECOMMENDATIONS: [name]'s test result is considered negative (normal).  This means that we have not identified a hereditary cause for her  personal and family history of cancer at this time. Most cancers happen by chance and this negative test suggests that her cancer may fall into this category.   ? ?While reassuring, this does not definitively rule out a hereditary predisposition to cancer. It is still possible that there could be genetic mutations that are undetectable by current technology. There could be genetic mutations in genes that have not been tested or identified to increase cancer risk.  Therefore, it is recommended she continue to follow the cancer management and screening guidelines provided by her oncology and primary healthcare provider.  ? ?An individual's cancer risk and medical management are not determined by genetic test results alone. Overall cancer risk assessment incorporates additional factors, including personal medical history, family history, and any available genetic information that may result in a  personalized plan for cancer prevention and surveillance. ? ?RECOMMENDATIONS FOR FAMILY MEMBERS:  Relatives in this family might be at some increased risk of developing cancer, over the general population risk, simply due to the family history of cancer.  We recommended female relatives in this family have a yearly mammogram beginning at age 41, or 16 years younger than the earliest onset of cancer, an annual clinical breast exam, and perform monthly breast self-exams. Female relatives in this family should also have a gynecological exam as recommended by their primary provider.  All family members should be referred for colonoscopy starting at age 74.  ? ? It is also possible there is a hereditary cause for the cancer in [name]'s family that she did not inherit and therefore was not identified in her.  Based on [name]'s family history, we recommended her sister's children/relatives have genetic counseling and testing. [name] will let us know if we can be of any assistance in coordinating genetic counseling and/or testing for these family members. ? ?FOLLOW-UP: Lastly, we discussed with [name] that cancer genetics is a rapidly advancing field and it is possible that new genetic tests will be appropriate for her and/or her family  members in the future. We encouraged her to remain in contact with cancer genetics on an annual basis so we can update her personal and family histories and let her know of advances in cancer genetics that may benefit this family.  ? ?Our contact number was provided. [name]'s questions were answered to her satisfaction, and she knows she is welcome to call us at anytime with additional questions or concerns.  ? ?Faith Rogue, MS, LCGC ?Genetic Counselor ?[email or web address] ?Phone: [phone] ? ? ?

## 2022-04-03 ENCOUNTER — Encounter: Payer: Self-pay | Admitting: Oncology

## 2022-04-03 ENCOUNTER — Inpatient Hospital Stay: Payer: Medicare HMO

## 2022-04-03 ENCOUNTER — Inpatient Hospital Stay: Payer: Medicare HMO | Attending: Oncology

## 2022-04-03 ENCOUNTER — Inpatient Hospital Stay (HOSPITAL_BASED_OUTPATIENT_CLINIC_OR_DEPARTMENT_OTHER): Payer: Medicare HMO | Admitting: Oncology

## 2022-04-03 VITALS — BP 138/78 | HR 78 | Temp 98.7°F | Resp 18 | Wt 126.5 lb

## 2022-04-03 DIAGNOSIS — D6481 Anemia due to antineoplastic chemotherapy: Secondary | ICD-10-CM | POA: Diagnosis not present

## 2022-04-03 DIAGNOSIS — L270 Generalized skin eruption due to drugs and medicaments taken internally: Secondary | ICD-10-CM | POA: Diagnosis not present

## 2022-04-03 DIAGNOSIS — Z171 Estrogen receptor negative status [ER-]: Secondary | ICD-10-CM | POA: Diagnosis not present

## 2022-04-03 DIAGNOSIS — C50311 Malignant neoplasm of lower-inner quadrant of right female breast: Secondary | ICD-10-CM | POA: Diagnosis not present

## 2022-04-03 DIAGNOSIS — Z79899 Other long term (current) drug therapy: Secondary | ICD-10-CM | POA: Insufficient documentation

## 2022-04-03 DIAGNOSIS — Z5111 Encounter for antineoplastic chemotherapy: Secondary | ICD-10-CM | POA: Insufficient documentation

## 2022-04-03 DIAGNOSIS — Z5112 Encounter for antineoplastic immunotherapy: Secondary | ICD-10-CM | POA: Diagnosis present

## 2022-04-03 LAB — COMPREHENSIVE METABOLIC PANEL
ALT: 20 U/L (ref 0–44)
AST: 21 U/L (ref 15–41)
Albumin: 3.7 g/dL (ref 3.5–5.0)
Alkaline Phosphatase: 55 U/L (ref 38–126)
Anion gap: 10 (ref 5–15)
BUN: 23 mg/dL (ref 8–23)
CO2: 25 mmol/L (ref 22–32)
Calcium: 7.8 mg/dL — ABNORMAL LOW (ref 8.9–10.3)
Chloride: 101 mmol/L (ref 98–111)
Creatinine, Ser: 0.64 mg/dL (ref 0.44–1.00)
GFR, Estimated: >60 mL/min (ref >60)
Glucose, Bld: 204 mg/dL — ABNORMAL HIGH (ref 70–99)
Potassium: 4.5 mmol/L (ref 3.5–5.1)
Sodium: 136 mmol/L (ref 135–145)
Total Bilirubin: 0.4 mg/dL (ref 0.3–1.2)
Total Protein: 6.4 g/dL — ABNORMAL LOW (ref 6.5–8.1)

## 2022-04-03 LAB — CBC WITH DIFFERENTIAL/PLATELET
Abs Immature Granulocytes: 0.09 10*3/uL — ABNORMAL HIGH (ref 0.00–0.07)
Basophils Absolute: 0 10*3/uL (ref 0.0–0.1)
Basophils Relative: 1 %
Eosinophils Absolute: 0.1 10*3/uL (ref 0.0–0.5)
Eosinophils Relative: 1 %
HCT: 30.4 % — ABNORMAL LOW (ref 36.0–46.0)
Hemoglobin: 10.3 g/dL — ABNORMAL LOW (ref 12.0–15.0)
Immature Granulocytes: 2 %
Lymphocytes Relative: 31 %
Lymphs Abs: 1.7 10*3/uL (ref 0.7–4.0)
MCH: 32.4 pg (ref 26.0–34.0)
MCHC: 33.9 g/dL (ref 30.0–36.0)
MCV: 95.6 fL (ref 80.0–100.0)
Monocytes Absolute: 0.3 10*3/uL (ref 0.1–1.0)
Monocytes Relative: 6 %
Neutro Abs: 3.2 10*3/uL (ref 1.7–7.7)
Neutrophils Relative %: 59 %
Platelets: 213 10*3/uL (ref 150–400)
RBC: 3.18 MIL/uL — ABNORMAL LOW (ref 3.87–5.11)
RDW: 13.3 % (ref 11.5–15.5)
WBC: 5.4 10*3/uL (ref 4.0–10.5)
nRBC: 0 % (ref 0.0–0.2)

## 2022-04-03 MED ORDER — PALONOSETRON HCL INJECTION 0.25 MG/5ML
0.2500 mg | Freq: Once | INTRAVENOUS | Status: AC
  Administered 2022-04-03: 0.25 mg via INTRAVENOUS
  Filled 2022-04-03: qty 5

## 2022-04-03 MED ORDER — HEPARIN SOD (PORK) LOCK FLUSH 100 UNIT/ML IV SOLN
500.0000 [IU] | Freq: Once | INTRAVENOUS | Status: AC | PRN
  Administered 2022-04-03: 500 [IU]
  Filled 2022-04-03: qty 5

## 2022-04-03 MED ORDER — SODIUM CHLORIDE 0.9 % IV SOLN
10.0000 mg | Freq: Once | INTRAVENOUS | Status: AC
  Administered 2022-04-03: 10 mg via INTRAVENOUS
  Filled 2022-04-03: qty 10

## 2022-04-03 MED ORDER — SODIUM CHLORIDE 0.9 % IV SOLN
Freq: Once | INTRAVENOUS | Status: AC
  Filled 2022-04-03: qty 250

## 2022-04-03 MED ORDER — FAMOTIDINE IN NACL 20-0.9 MG/50ML-% IV SOLN
20.0000 mg | Freq: Once | INTRAVENOUS | Status: AC
  Administered 2022-04-03: 20 mg via INTRAVENOUS
  Filled 2022-04-03: qty 50

## 2022-04-03 MED ORDER — HEPARIN SOD (PORK) LOCK FLUSH 100 UNIT/ML IV SOLN
INTRAVENOUS | Status: AC
  Filled 2022-04-03: qty 5

## 2022-04-03 MED ORDER — SODIUM CHLORIDE 0.9 % IV SOLN
80.0000 mg/m2 | Freq: Once | INTRAVENOUS | Status: AC
  Administered 2022-04-03: 126 mg via INTRAVENOUS
  Filled 2022-04-03: qty 21

## 2022-04-03 MED ORDER — DIPHENHYDRAMINE HCL 50 MG/ML IJ SOLN
50.0000 mg | Freq: Once | INTRAMUSCULAR | Status: AC
  Administered 2022-04-03: 50 mg via INTRAVENOUS
  Filled 2022-04-03: qty 1

## 2022-04-03 MED ORDER — SODIUM CHLORIDE 0.9 % IV SOLN
109.8000 mg | Freq: Once | INTRAVENOUS | Status: AC
  Administered 2022-04-03: 110 mg via INTRAVENOUS
  Filled 2022-04-03: qty 11

## 2022-04-03 NOTE — Progress Notes (Signed)
Per MD to continue with treatment with Ca-7.8. Treatment team updated. ? ?Prudence Heiny  ?

## 2022-04-03 NOTE — Progress Notes (Signed)
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  It showed a suspicious mass at the 5:30 position of the right breast 4 cm from the nipple measuring 1.8 x 1.6 x 1.2 cm.  There may be a thickened echogenic rim which would bring the measurement to 2.9 x 1.9 x 2.3 cm.  Overlying skin thickening.  No axillary adenopathy.  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  Patient referred for further management.  History obtained with the help of Spanish interpreter.  Patient is independent of her ADLs at baseline.She reports some baseline diabetic neuropathy for which she is on gabapentin.   Family history significant for breast cancer in her sister  in her 30s.  She is here with her daughter today.  Reports her appetite and weight haveRemained stable.  She is anxious about her next steps with regards to breast cancer.   Patient is being treated with neoadjuvant intent with keynote 522 regimen starting with CarboTaxol Keytruda chemotherapy on 02/27/2022  Interval history-history obtained with the help of Spanish interpreter.  Patient is tolerating chemotherapy  well without any significant nausea vomiting or peripheral neuropathy.  She does report a skin rash which started a few days ago involving her right arm as well as behind her right leg.  Reports occasional pruritus associated with it.  ECOG PS- 1 Pain scale- 0   Review of systems- Review of Systems  Constitutional:  Negative for chills, fever, malaise/fatigue and weight loss.  HENT:  Negative for congestion, ear discharge and nosebleeds.   Eyes:  Negative for blurred vision.  Respiratory:  Negative for cough, hemoptysis, sputum production, shortness of breath and wheezing.   Cardiovascular:  Negative for chest pain, palpitations, orthopnea and claudication.  Gastrointestinal:  Negative for abdominal pain, blood in stool, constipation, diarrhea, heartburn, melena, nausea and vomiting.  Genitourinary:  Negative for dysuria, flank pain, frequency, hematuria and urgency.  Musculoskeletal:  Negative for back pain, joint pain and myalgias.  Skin:  Positive for rash.  Neurological:  Negative for dizziness, tingling, focal weakness, seizures, weakness and headaches.  Endo/Heme/Allergies:  Does not bruise/bleed easily.  Psychiatric/Behavioral:  Negative for depression and suicidal ideas. The patient does not have insomnia.       Allergies  Allergen Reactions   Contrast Media  [Iodinated Contrast Media] Nausea And Vomiting and Other (See Comments)    Pt was pre medicated 03/28/22 and 3 ML of contrast injected per [facility] A Cath check.  Pt reported no problems.      Past Medical History:  Diagnosis Date   Breast cancer, right breast (HCC)    Diabetes mellitus without complication (HCC)    Family history of prostate cancer  Gastritis    Heart murmur    History of pancreatitis    Hyperlipidemia    Hypertension    Palpitations      Past Surgical History:  Procedure Laterality Date   BREAST BIOPSY Right 11/2015   benign   BREAST BIOPSY Right 01/24/2022   INVASIVE MAMMARY CARCINOMA, NO  SPECIAL TYPE   BREAST CYST EXCISION Right    CESAREAN SECTION     2x   CHOLECYSTECTOMY     IR CV LINE INJECTION  03/28/2022   PORTACATH PLACEMENT N/A 03/01/2022   Procedure: INSERTION PORT-A-CATH;  Surgeon: Carolan Shiver, MD;  Location: [facility] ORS;  Service: General;  Laterality: N/A;    Social History   Socioeconomic History   Marital status: Married    Spouse name: Not on file   Number of children: Not on file   Years of education: Not on file   Highest education level: Not on file  Occupational History   Not on file  Tobacco Use   Smoking status: Never   Smokeless tobacco: Never  Vaping Use   Vaping Use: Never used  Substance and Sexual Activity   Alcohol use: Never   Drug use: Never   Sexual activity: Not on file  Other Topics Concern   Not on file  Social History Narrative   Not on file   Social Determinants of Health   Financial Resource Strain: Low Risk    Difficulty of Paying Living Expenses: Not very hard  Food Insecurity: No Food Insecurity   Worried About Running Out of Food in the Last Year: Never true   Ran Out of Food in the Last Year: Never true  Transportation Needs: No Transportation Needs   Lack of Transportation (Medical): No   Lack of Transportation (Non-Medical): No  Physical Activity: Inactive   Days of Exercise per Week: 0 days   Minutes of Exercise per Session: 0 min  Stress: No Stress Concern Present   Feeling of Stress : Only a little  Social Connections: Moderately Integrated   Frequency of Communication with Friends and Family: Three times a week   Frequency of Social Gatherings with Friends and Family: Three times a week   Attends Religious Services: 1 to 4 times per year   Active Member of Clubs or Organizations: No   Attends Banker Meetings: Never   Marital Status: Married  Catering manager Violence: Not At Risk   Fear of Current or Ex-Partner: No   Emotionally Abused: No   Physically Abused: No   Sexually  Abused: No    Family History  Problem Relation Age of Onset   Heart disease Father    Breast cancer Sister        30s   Prostate cancer Paternal Grandfather    Stomach cancer Cousin      Current Outpatient Medications:    atorvastatin (LIPITOR) 80 MG tablet, Take 80 mg by mouth daily., Disp: , Rfl:    DULoxetine (CYMBALTA) 60 MG capsule, Take 60 mg by mouth daily., Disp: , Rfl:    enalapril (VASOTEC) 20 MG tablet, Take 20 mg by mouth daily., Disp: , Rfl:    fenofibrate (TRICOR) 145 MG tablet, Take 145 mg by mouth daily., Disp: , Rfl:    fluticasone (FLONASE) 50 MCG/ACT nasal spray, Place 2 sprays into both nostrils daily as needed for allergies or rhinitis., Disp: , Rfl:    gabapentin (NEURONTIN) 300 MG capsule, Take 300 mg by mouth in the morning and  at bedtime., Disp: , Rfl:    glipiZIDE (GLUCOTROL XL) 2.5 MG 24 hr tablet, Take 2.5 mg by mouth daily., Disp: , Rfl:    hydrochlorothiazide (MICROZIDE) 12.5 MG capsule, Take 12.5 mg by mouth daily., Disp: , Rfl:    lidocaine-prilocaine (EMLA) cream, Apply to affected area once, Disp: 30 g, Rfl: 3   metoprolol succinate (TOPROL XL) 25 MG 24 hr tablet, Take 1 tablet (25 mg total) by mouth daily., Disp: 90 tablet, Rfl: 3   predniSONE (DELTASONE) 10 MG tablet, Take by mouth., Disp: , Rfl:    aspirin 81 MG EC tablet, Take 1 tablet by mouth daily. (Patient not taking: Reported on 03/06/2022), Disp: , Rfl:    atorvastatin (LIPITOR) 40 MG tablet, Take by mouth. (Patient not taking: Reported on 04/03/2022), Disp: , Rfl:    dexamethasone (DECADRON) 4 MG tablet, Take 2 tablets (8 mg total) by mouth daily. Start the day after chemotherapy for 2 days. (Patient not taking: Reported on 03/20/2022), Disp: 30 tablet, Rfl: 1   LORazepam (ATIVAN) 0.5 MG tablet, Take 1 tablet (0.5 mg total) by mouth every 6 (six) hours as needed (Nausea or vomiting). (Patient not taking: Reported on 03/06/2022), Disp: 30 tablet, Rfl: 0   meloxicam (MOBIC) 15 MG tablet, Take 15 mg by  mouth daily as needed for pain. (Patient not taking: Reported on 03/20/2022), Disp: , Rfl:    ondansetron (ZOFRAN) 8 MG tablet, Take 1 tablet (8 mg total) by mouth 2 (two) times daily as needed for refractory nausea / vomiting. Start on day 3 after chemo. (Patient not taking: Reported on 03/20/2022), Disp: 30 tablet, Rfl: 1   prochlorperazine (COMPAZINE) 10 MG tablet, Take 1 tablet (10 mg total) by mouth every 6 (six) hours as needed (Nausea or vomiting). (Patient not taking: Reported on 03/06/2022), Disp: 30 tablet, Rfl: 1   tiZANidine (ZANAFLEX) 4 MG tablet, Take 4 mg by mouth every 8 (eight) hours as needed for muscle spasms. (Patient not taking: Reported on 03/20/2022), Disp: , Rfl:    traMADol (ULTRAM) 50 MG tablet, Take 1 tablet (50 mg total) by mouth every 6 (six) hours as needed. (Patient not taking: Reported on 03/20/2022), Disp: 10 tablet, Rfl: 0   vitamin C (ASCORBIC ACID) 500 MG tablet, Take 500 mg by mouth daily. (Patient not taking: Reported on 03/20/2022), Disp: , Rfl:  No current facility-administered medications for this visit.  Facility-Administered Medications Ordered in Other Visits:    CARBOplatin (PARAPLATIN) 110 mg in sodium chloride 0.9 % 100 mL chemo infusion, 110 mg, Intravenous, Once, Creig Hines, MD, Last Rate: 222 mL/hr at 04/03/22 1235, 110 mg at 04/03/22 1235   heparin lock flush 100 UNIT/ML injection, , , ,    heparin lock flush 100 unit/mL, 500 Units, Intracatheter, Once PRN, Creig Hines, MD  Physical exam:  Vitals:   04/03/22 0859  BP: 138/78  Pulse: 78  Resp: 18  Temp: 98.7 F (37.1 C)  SpO2: 100%  Weight: 126 lb 8 oz (57.4 kg)   Physical Exam Constitutional:      General: She is not in acute distress. Cardiovascular:     Rate and Rhythm: Normal rate and regular rhythm.     Heart sounds: Normal heart sounds.  Pulmonary:     Effort: Pulmonary effort is normal.     Breath sounds: Normal breath sounds.  Abdominal:     General: Bowel sounds are normal.      Palpations: Abdomen is soft.  Skin:  General: Skin is warm and dry.     Comments: Pruritic erythematous rash noted over right antecubital fossa and right leg  Neurological:     Mental Status: She is alert and oriented to person, place, and time.        Latest Ref Rng & Units 04/03/2022    8:33 AM  CMP  Glucose 70 - 99 mg/dL 161    BUN 8 - 23 mg/dL 23    Creatinine 0.96 - 1.00 mg/dL 0.45    Sodium 409 - 811 mmol/L 136    Potassium 3.5 - 5.1 mmol/L 4.5    Chloride 98 - 111 mmol/L 101    CO2 22 - 32 mmol/L 25    Calcium 8.9 - 10.3 mg/dL 7.8    Total Protein 6.5 - 8.1 g/dL 6.4    Total Bilirubin 0.3 - 1.2 mg/dL 0.4    Alkaline Phos 38 - 126 U/L 55    AST 15 - 41 U/L 21    ALT 0 - 44 U/L 20        Latest Ref Rng & Units 04/03/2022    8:33 AM  CBC  WBC 4.0 - 10.5 K/uL 5.4    Hemoglobin 12.0 - 15.0 g/dL 91.4    Hematocrit 78.2 - 46.0 % 30.4    Platelets 150 - 400 K/uL 213      No images are attached to the encounter.  IR CV Line Injection  Result Date: 03/28/2022 CLINICAL DATA:  port not working -- difficult  aspiration EXAM: PORT  CATHETER INJECTION UNDER FLUOROSCOPY TECHNIQUE: The procedure, risks (including but not limited to bleeding, infection, organ damage ), benefits, and alternatives were explained to the patient. Questions regarding the procedure were encouraged and answered. The patient understands and consents to the procedure. Survey fluoroscopic inspection reveals left IJ port catheter, tip at the cavoatrial junction. No fracture, kink, or evident stenosis. No pneumothorax. Injection demonstrates patency of the reservoir and port catheter. No extravasation or leak. There is partially encasing fibrin sheath over a short segment at the tip of the catheter. Distal SVC and right atrium widely patent. IMPRESSION: 1. Left IJ port catheter well positioned and intact, tip at the cavoatrial junction. 2. Partially encasing fibrin sheath at the tip. RECOMMENDATIONS: *Cathflo  Activase dwell per protocol. *Catheter is okay to use for infusion. Electronically Signed   By: Corlis Leak M.D.   On: 03/28/2022 15:30     Assessment and plan- Patient is a 66 y.o. female with triple negative breast cancer stage IIb and cT2 N0 M0.  She is here for on treatment assessment prior to cycle 6 of neoadjuvant CarboTaxol chemotherapy  Counts okay to proceed with cycle 6 of neoadjuvant weekly CarboTaxol chemotherapy today.  She will proceed with cycle 7 next week when she will also receive dose 3 of Keytruda.  I will see her back in 2 weeks for cycle 9 of weekly CarboTaxol chemotherapy.  Counts are holding up well so far and she has not required any Neupogen.  Chemo-induced anemia: Stable continue to monitor  Drug-induced skin rash: Possibly secondary to Taxol versus Keytruda.  It appears mild presently and I have asked her to take over-the-counter Benadryl as needed and topical emollients.  If rash worsens we will consider giving her topical steroids as well.   Visit Diagnosis 1. Drug-induced skin rash   2. Encounter for antineoplastic chemotherapy   3. Malignant neoplasm of lower-inner quadrant of right breast of female, estrogen receptor negative (HCC)  Dr. Owens Shark, MD, MPH [facility] at [facility] [phone] 04/03/2022 12:43 PM

## 2022-04-03 NOTE — Progress Notes (Signed)
Pt states that the day before yesterday ahse noticed a rash on her arms and legs. pt is allergic to contrast and had a scan done last Tuesday; was provided with the dye prep. Pt states it is itchy.  ? ?

## 2022-04-03 NOTE — Patient Instructions (Signed)
[facility] AT [place]  Discharge Instructions: Thank you for choosing [facility] to provide your oncology and hematology care.  If you have a lab appointment with the Cancer Center, please go directly to the Cancer Center and check in at the registration area.  Wear comfortable clothing and clothing appropriate for easy access to any Portacath or PICC line.   We strive to give you quality time with your provider. You may need to reschedule your appointment if you arrive late (15 or more minutes).  Arriving late affects you and other patients whose appointments are after yours.  Also, if you miss three or more appointments without notifying the office, you may be dismissed from the clinic at the provider's discretion.      For prescription refill requests, have your pharmacy contact our office and allow 72 hours for refills to be completed.    Today you received the following chemotherapy and/or immunotherapy agents Taxol, Carboplatin    To help prevent nausea and vomiting after your treatment, we encourage you to take your nausea medication as directed.  BELOW ARE SYMPTOMS THAT SHOULD BE REPORTED IMMEDIATELY: *FEVER GREATER THAN 100.4 F (38 C) OR HIGHER *CHILLS OR SWEATING *NAUSEA AND VOMITING THAT IS NOT CONTROLLED WITH YOUR NAUSEA MEDICATION *UNUSUAL SHORTNESS OF BREATH *UNUSUAL BRUISING OR BLEEDING *URINARY PROBLEMS (pain or burning when urinating, or frequent urination) *BOWEL PROBLEMS (unusual diarrhea, constipation, pain near the anus) TENDERNESS IN MOUTH AND THROAT WITH OR WITHOUT PRESENCE OF ULCERS (sore throat, sores in mouth, or a toothache) UNUSUAL RASH, SWELLING OR PAIN  UNUSUAL VAGINAL DISCHARGE OR ITCHING   Items with * indicate a potential emergency and should be followed up as soon as possible or go to the Emergency Department if any problems should occur.  Please show the CHEMOTHERAPY ALERT CARD or IMMUNOTHERAPY ALERT CARD at  check-in to the Emergency Department and triage nurse.  Should you have questions after your visit or need to cancel or reschedule your appointment, please contact [facility] [place]  [phone] and follow the prompts.  Office hours are 8:00 a.m. to 4:30 p.m. Monday - Friday. Please note that voicemails left after 4:00 p.m. may not be returned until the following business day.  We are closed weekends and major holidays. You have access to a nurse at all times for urgent questions. Please call the main number to the clinic [phone] and follow the prompts.  For any non-urgent questions, you may also contact your provider using MyChart. We now offer e-Visits for anyone 18 and older to request care online for non-urgent symptoms. For details visit [email or web address].   Also download the MyChart app! Go to the app store, search "MyChart", open the app, select [place], and log in with your MyChart username and password.  Due to Covid, a mask is required upon entering the hospital/clinic. If you do not have a mask, one will be given to you upon arrival. For doctor visits, patients may have 1 support person aged 18 or older with them. For treatment visits, patients cannot have anyone with them due to current Covid guidelines and our immunocompromised population.  

## 2022-04-10 ENCOUNTER — Inpatient Hospital Stay: Payer: Medicare HMO

## 2022-04-10 VITALS — BP 144/58 | HR 79 | Temp 97.5°F | Resp 18 | Ht 60.0 in | Wt 125.2 lb

## 2022-04-10 DIAGNOSIS — Z171 Estrogen receptor negative status [ER-]: Secondary | ICD-10-CM

## 2022-04-10 DIAGNOSIS — Z5112 Encounter for antineoplastic immunotherapy: Secondary | ICD-10-CM | POA: Diagnosis not present

## 2022-04-10 LAB — COMPREHENSIVE METABOLIC PANEL
ALT: 21 U/L (ref 0–44)
AST: 20 U/L (ref 15–41)
Albumin: 3.6 g/dL (ref 3.5–5.0)
Alkaline Phosphatase: 58 U/L (ref 38–126)
Anion gap: 5 (ref 5–15)
BUN: 22 mg/dL (ref 8–23)
CO2: 27 mmol/L (ref 22–32)
Calcium: 8.8 mg/dL — ABNORMAL LOW (ref 8.9–10.3)
Chloride: 102 mmol/L (ref 98–111)
Creatinine, Ser: 0.66 mg/dL (ref 0.44–1.00)
GFR, Estimated: >60 mL/min (ref >60)
Glucose, Bld: 204 mg/dL — ABNORMAL HIGH (ref 70–99)
Potassium: 3.7 mmol/L (ref 3.5–5.1)
Sodium: 134 mmol/L — ABNORMAL LOW (ref 135–145)
Total Bilirubin: 0.6 mg/dL (ref 0.3–1.2)
Total Protein: 6.4 g/dL — ABNORMAL LOW (ref 6.5–8.1)

## 2022-04-10 LAB — CBC WITH DIFFERENTIAL/PLATELET
Abs Immature Granulocytes: 0.05 10*3/uL (ref 0.00–0.07)
Basophils Absolute: 0.1 10*3/uL (ref 0.0–0.1)
Basophils Relative: 1 %
Eosinophils Absolute: 0.1 10*3/uL (ref 0.0–0.5)
Eosinophils Relative: 1 %
HCT: 29 % — ABNORMAL LOW (ref 36.0–46.0)
Hemoglobin: 10.1 g/dL — ABNORMAL LOW (ref 12.0–15.0)
Immature Granulocytes: 1 %
Lymphocytes Relative: 28 %
Lymphs Abs: 1.3 10*3/uL (ref 0.7–4.0)
MCH: 33.3 pg (ref 26.0–34.0)
MCHC: 34.8 g/dL (ref 30.0–36.0)
MCV: 95.7 fL (ref 80.0–100.0)
Monocytes Absolute: 0.3 10*3/uL (ref 0.1–1.0)
Monocytes Relative: 7 %
Neutro Abs: 2.7 10*3/uL (ref 1.7–7.7)
Neutrophils Relative %: 62 %
Platelets: 248 10*3/uL (ref 150–400)
RBC: 3.03 MIL/uL — ABNORMAL LOW (ref 3.87–5.11)
RDW: 13.8 % (ref 11.5–15.5)
WBC: 4.5 10*3/uL (ref 4.0–10.5)
nRBC: 0 % (ref 0.0–0.2)

## 2022-04-10 MED ORDER — DIPHENHYDRAMINE HCL 50 MG/ML IJ SOLN
50.0000 mg | Freq: Once | INTRAMUSCULAR | Status: AC
  Administered 2022-04-10: 50 mg via INTRAVENOUS
  Filled 2022-04-10: qty 1

## 2022-04-10 MED ORDER — SODIUM CHLORIDE 0.9 % IV SOLN
200.0000 mg | Freq: Once | INTRAVENOUS | Status: AC
  Administered 2022-04-10: 200 mg via INTRAVENOUS
  Filled 2022-04-10: qty 200

## 2022-04-10 MED ORDER — SODIUM CHLORIDE 0.9 % IV SOLN
10.0000 mg | Freq: Once | INTRAVENOUS | Status: AC
  Administered 2022-04-10: 10 mg via INTRAVENOUS
  Filled 2022-04-10: qty 10

## 2022-04-10 MED ORDER — SODIUM CHLORIDE 0.9 % IV SOLN
80.0000 mg/m2 | Freq: Once | INTRAVENOUS | Status: AC
  Administered 2022-04-10: 126 mg via INTRAVENOUS
  Filled 2022-04-10: qty 21

## 2022-04-10 MED ORDER — SODIUM CHLORIDE 0.9 % IV SOLN
109.8000 mg | Freq: Once | INTRAVENOUS | Status: AC
  Administered 2022-04-10: 110 mg via INTRAVENOUS
  Filled 2022-04-10: qty 11

## 2022-04-10 MED ORDER — FAMOTIDINE IN NACL 20-0.9 MG/50ML-% IV SOLN
20.0000 mg | Freq: Once | INTRAVENOUS | Status: AC
  Administered 2022-04-10: 20 mg via INTRAVENOUS
  Filled 2022-04-10: qty 50

## 2022-04-10 MED ORDER — HEPARIN SOD (PORK) LOCK FLUSH 100 UNIT/ML IV SOLN
500.0000 [IU] | Freq: Once | INTRAVENOUS | Status: AC | PRN
  Administered 2022-04-10: 500 [IU]
  Filled 2022-04-10: qty 5

## 2022-04-10 MED ORDER — PALONOSETRON HCL INJECTION 0.25 MG/5ML
0.2500 mg | Freq: Once | INTRAVENOUS | Status: AC
  Administered 2022-04-10: 0.25 mg via INTRAVENOUS
  Filled 2022-04-10: qty 5

## 2022-04-10 MED ORDER — SODIUM CHLORIDE 0.9 % IV SOLN
Freq: Once | INTRAVENOUS | Status: AC
  Filled 2022-04-10: qty 250

## 2022-04-10 NOTE — Patient Instructions (Signed)
[facility] CANCER CTR AT [place]  Discharge Instructions: ?Thank you for choosing [facility] to provide your oncology and hematology care.  ?If you have a lab appointment with the [place], please go directly to the [facility] [place] and check in at the registration area. ? ?Wear comfortable clothing and clothing appropriate for easy access to any Portacath or PICC line.  ? ?We strive to give you quality time with your provider. You may need to reschedule your appointment if you arrive late (15 or more minutes).  Arriving late affects you and other patients whose appointments are after yours.  Also, if you miss three or more appointments without notifying the office, you may be dismissed from the clinic at the provider?s discretion.    ?  ?For prescription refill requests, have your pharmacy contact our office and allow 72 hours for refills to be completed.   ? ?Today you received the following chemotherapy and/or immunotherapy agents KEYTRUDA, TAXOL, CARBOPLATIN    ?  ?To help prevent nausea and vomiting after your treatment, we encourage you to take your nausea medication as directed. ? ?BELOW ARE SYMPTOMS THAT SHOULD BE REPORTED IMMEDIATELY: ?*FEVER GREATER THAN 100.4 F (38 ?C) OR HIGHER ?*CHILLS OR SWEATING ?*NAUSEA AND VOMITING THAT IS NOT CONTROLLED WITH YOUR NAUSEA MEDICATION ?*UNUSUAL SHORTNESS OF BREATH ?*UNUSUAL BRUISING OR BLEEDING ?*URINARY PROBLEMS (pain or burning when urinating, or frequent urination) ?*BOWEL PROBLEMS (unusual diarrhea, constipation, pain near the anus) ?TENDERNESS IN MOUTH AND THROAT WITH OR WITHOUT PRESENCE OF ULCERS (sore throat, sores in mouth, or a toothache) ?UNUSUAL RASH, SWELLING OR PAIN  ?UNUSUAL VAGINAL DISCHARGE OR ITCHING  ? ?Items with * indicate a potential emergency and should be followed up as soon as possible or go to the Emergency Department if any problems should occur. ? ?Please show the CHEMOTHERAPY ALERT CARD or IMMUNOTHERAPY ALERT  CARD at check-in to the Emergency Department and triage nurse. ? ?Should you have questions after your visit or need to cancel or reschedule your appointment, please contact [facility] CANCER [place] AT [place]  [phone] and follow the prompts.  Office hours are 8:00 a.m. to 4:30 p.m. Monday - Friday. Please note that voicemails left after 4:00 p.m. may not be returned until the following business day.  We are closed weekends and major holidays. You have access to a nurse at all times for urgent questions. Please call the main number to the clinic [phone] and follow the prompts. ? ?For any non-urgent questions, you may also contact your provider using MyChart. We now offer e-Visits for anyone 93 and older to request care online for non-urgent symptoms. For details visit [email or web address]. ?  ?Also download the MyChart app! Go to the app store, search "MyChart", open the app, select [place], and log in with your MyChart username and password. ? ?Due to Covid, a mask is required upon entering the hospital/clinic. If you do not have a mask, one will be given to you upon arrival. For doctor visits, patients may have 1 support person aged 60 or older with them. For treatment visits, patients cannot have anyone with them due to current Covid guidelines and our immunocompromised population.  ? ?Pembrolizumab injection ??Qu? es [employer]? ?El PEMBROLIZUMAB es un anticuerpo monoclonal. [place] para tratar ciertos tipos de c?ncer. ?[employer] puede ser utilizado para otros usos; si tiene alguna pregunta consulte con su proveedor de atenci?n m?dica o con su farmac?utico. ?MARCAS COMUNES: Keytruda ??Qu? le debo informar a mi profesional  de la salud antes de tomar este medicamento? ?Necesitan saber si usted presenta alguno de los [employer] o situaciones: ?enfermedades autoinmunes tales como enfermedad de Crohn, colitis ulcerativa o lupus ?ha tenido o planea tener un trasplante  alog?nico de c?lulas madre ([place] las c?lulas madre de otra persona) ?antecedentes de trasplante de ?rganos ?antecedentes de radiaci?n en el pecho ?problemas del sistema nervioso, tales como miastenia grave o s?ndrome de Guillain-Barr? ?una reacci?n al?rgica o inusual al pembrolizumab, a otros medicamentos, alimentos, colorantes o conservantes ?si est? embarazada o buscando quedar embarazada ?si est? amamantando a un beb? ??C?mo debo utilizar este medicamento? ?Este medicamento se administra mediante infusi?n en una vena. Lo administra un profesional de Technical sales engineer en un hospital o en un entorno cl?nico. ?Se le entregar? una Gu?a del medicamento (MedGuide, su nombre en ingl?s) especial antes de cada tratamiento. Aseg?rese de leer esta informaci?n cada vez cuidadosamente. ?Hable con su pediatra para informarse acerca del uso de este medicamento en ni?os. Aunque este medicamento se puede recetar a ni?os tan peque?os como de 6 meses de edad con ciertas afecciones, existen precauciones que deben tomarse. ?Sobredosis: P?ngase en contacto inmediatamente con un centro toxicol?gico o una sala de urgencia si usted cree que haya tomado demasiado medicamento. ?ATENCI?N: [employer] es solo para usted. No comparta este medicamento con nadie. ??Qu? sucede si me olvido de una dosis? ?Es importante no olvidar ninguna dosis. Informe a su m?dico o a su profesional de la salud si no puede asistir a una cita. ??Qu? puede interactuar con este medicamento? ?No se han estudiado las interacciones. ?Puede ser que esta lista no menciona todas las posibles interacciones. Informe a su profesional de la salud de [employer] productos a base de hierbas, medicamentos de venta libre o suplementos nutritivos que est? tomando. Si usted fuma, consume bebidas alcoh?licas o si utiliza drogas ilegales, ind?queselo tambi?n a su profesional de [employer]	[place]. Algunas sustancias pueden interactuar con su medicamento. ??A qu? debo estar atento al [employer]? ?Se supervisar? su estado de salud atentamente mientras reciba este medicamento. ?Usted podr?a necesitar realizarse an?lisis de Environmental health practitioner est? [employer]. ?No debe quedar embarazada mientras est? usando este medicamento o por 4 meses despu?s de dejar de usarlo. Las mujeres deben informar a su m?dico si est?n buscando quedar embarazadas o si creen que podr?an estar embarazadas. Existe la posibilidad de efectos secundarios graves en un beb? sin nacer. Para obtener m?s informaci?n, hable con su profesional de la salud o su farmac?utico. No debe amamantar a un beb? mientras est? usando este medicamento o durante 4 meses despu?s de la ?ltima dosis. ??Qu? efectos secundarios puedo tener al [employer] medicamento? ?Efectos secundarios que debe informar a su m?dico o a su profesional de la salud tan pronto como sea posible: ?reacciones al?rgicas, tales como erupci?n cut?nea, comez?n/picaz?n o urticaria, e hinchaz?n de la cara, los labios o la lengua ?con [place] o de color negro y aspecto alquitranado ?problemas para respirar ?cambios en la visi?n ?dolor en el pecho ?escalofr?os ?confusi?n ?estre?imiento ?tos ?diarrea ?mareo, sensaci?n de desmayo o aturdimiento ?frecuencia cardiaca r?pida o irregular ?fiebre ?enrojecimiento ?dolor en las articulaciones ?recuentos sangu?neos bajos: este medicamento podr?a reducir la cantidad de gl?bulos blancos, gl?bulos rojos y plaquetas. Su riesgo de infecci?n y sangrado podr?a ser mayor. ?dolor muscular ?debilidad muscular ?dolor, hormigueo o entumecimiento de las manos o los pies ?dolor de cabeza persistente ?enrojecimiento, formaci?n de ampollas, descamaci?n o distensi?n de la piel, incluso dentro de la boca ?signos y s?ntomas  de niveles elevados de az?car en la sangre, tales como mareo; boca seca; piel seca; aliento frutal; n?useas; dolor de est?mago; aumento del apetito o la sed; aumento de la frecuencia urinaria ?signos y s?ntomas de lesi?n renal,  tales como dificultad para orinar o cambios en la cantidad de orina ?signos y s?ntomas de lesi?n en el h?gado, como orina oscura, heces claras, p?rdida del apetito, n?useas, dolor en la regi?n abdominal superior d

## 2022-04-14 MED FILL — Dexamethasone Sodium Phosphate Inj 100 MG/10ML: INTRAMUSCULAR | Qty: 1 | Status: AC

## 2022-04-17 ENCOUNTER — Encounter: Payer: Self-pay | Admitting: Oncology

## 2022-04-17 ENCOUNTER — Inpatient Hospital Stay: Payer: Medicare HMO

## 2022-04-17 ENCOUNTER — Inpatient Hospital Stay: Payer: Medicare HMO | Admitting: Oncology

## 2022-04-17 VITALS — BP 129/60 | HR 78

## 2022-04-17 VITALS — BP 147/61 | HR 88 | Temp 96.5°F | Resp 16 | Wt 126.1 lb

## 2022-04-17 DIAGNOSIS — Z171 Estrogen receptor negative status [ER-]: Secondary | ICD-10-CM | POA: Diagnosis not present

## 2022-04-17 DIAGNOSIS — Z5112 Encounter for antineoplastic immunotherapy: Secondary | ICD-10-CM | POA: Diagnosis not present

## 2022-04-17 DIAGNOSIS — Z5111 Encounter for antineoplastic chemotherapy: Secondary | ICD-10-CM | POA: Diagnosis not present

## 2022-04-17 DIAGNOSIS — C50311 Malignant neoplasm of lower-inner quadrant of right female breast: Secondary | ICD-10-CM

## 2022-04-17 LAB — CBC WITH DIFFERENTIAL/PLATELET
Abs Immature Granulocytes: 0.04 10*3/uL (ref 0.00–0.07)
Basophils Absolute: 0 10*3/uL (ref 0.0–0.1)
Basophils Relative: 1 %
Eosinophils Absolute: 0 10*3/uL (ref 0.0–0.5)
Eosinophils Relative: 1 %
HCT: 27.7 % — ABNORMAL LOW (ref 36.0–46.0)
Hemoglobin: 9.3 g/dL — ABNORMAL LOW (ref 12.0–15.0)
Immature Granulocytes: 1 %
Lymphocytes Relative: 30 %
Lymphs Abs: 1.3 10*3/uL (ref 0.7–4.0)
MCH: 32.6 pg (ref 26.0–34.0)
MCHC: 33.6 g/dL (ref 30.0–36.0)
MCV: 97.2 fL (ref 80.0–100.0)
Monocytes Absolute: 0.3 10*3/uL (ref 0.1–1.0)
Monocytes Relative: 6 %
Neutro Abs: 2.6 10*3/uL (ref 1.7–7.7)
Neutrophils Relative %: 61 %
Platelets: 258 10*3/uL (ref 150–400)
RBC: 2.85 MIL/uL — ABNORMAL LOW (ref 3.87–5.11)
RDW: 14.3 % (ref 11.5–15.5)
WBC: 4.3 10*3/uL (ref 4.0–10.5)
nRBC: 0 % (ref 0.0–0.2)

## 2022-04-17 LAB — FOLATE: Folate: 5.8 ng/mL — ABNORMAL LOW (ref >5.9)

## 2022-04-17 LAB — IRON AND TIBC
Iron: 56 ug/dL (ref 28–170)
Saturation Ratios: 15 % (ref 10.4–31.8)
TIBC: 374 ug/dL (ref 250–450)
UIBC: 318 ug/dL

## 2022-04-17 LAB — RETICULOCYTES
Immature Retic Fract: 20.8 % — ABNORMAL HIGH (ref 2.3–15.9)
RBC.: 2.81 MIL/uL — ABNORMAL LOW (ref 3.87–5.11)
Retic Count, Absolute: 49.2 10*3/uL (ref 19.0–186.0)
Retic Ct Pct: 1.8 % (ref 0.4–3.1)

## 2022-04-17 LAB — COMPREHENSIVE METABOLIC PANEL
ALT: 22 U/L (ref 0–44)
AST: 20 U/L (ref 15–41)
Albumin: 3.7 g/dL (ref 3.5–5.0)
Alkaline Phosphatase: 67 U/L (ref 38–126)
Anion gap: 5 (ref 5–15)
BUN: 23 mg/dL (ref 8–23)
CO2: 23 mmol/L (ref 22–32)
Calcium: 7.5 mg/dL — ABNORMAL LOW (ref 8.9–10.3)
Chloride: 105 mmol/L (ref 98–111)
Creatinine, Ser: 0.67 mg/dL (ref 0.44–1.00)
GFR, Estimated: >60 mL/min (ref >60)
Glucose, Bld: 199 mg/dL — ABNORMAL HIGH (ref 70–99)
Potassium: 3.6 mmol/L (ref 3.5–5.1)
Sodium: 133 mmol/L — ABNORMAL LOW (ref 135–145)
Total Bilirubin: 0.6 mg/dL (ref 0.3–1.2)
Total Protein: 6 g/dL — ABNORMAL LOW (ref 6.5–8.1)

## 2022-04-17 LAB — FERRITIN: Ferritin: 89 ng/mL (ref 11–307)

## 2022-04-17 MED ORDER — SODIUM CHLORIDE 0.9 % IV SOLN
Freq: Once | INTRAVENOUS | Status: AC
  Filled 2022-04-17: qty 250

## 2022-04-17 MED ORDER — HEPARIN SOD (PORK) LOCK FLUSH 100 UNIT/ML IV SOLN
500.0000 [IU] | Freq: Once | INTRAVENOUS | Status: AC
  Administered 2022-04-17: 500 [IU] via INTRAVENOUS
  Filled 2022-04-17: qty 5

## 2022-04-17 MED ORDER — SODIUM CHLORIDE 0.9% FLUSH
10.0000 mL | INTRAVENOUS | Status: DC | PRN
  Administered 2022-04-17: 10 mL via INTRAVENOUS
  Filled 2022-04-17: qty 10

## 2022-04-17 MED ORDER — PALONOSETRON HCL INJECTION 0.25 MG/5ML
0.2500 mg | Freq: Once | INTRAVENOUS | Status: AC
  Administered 2022-04-17: 0.25 mg via INTRAVENOUS
  Filled 2022-04-17: qty 5

## 2022-04-17 MED ORDER — SODIUM CHLORIDE 0.9 % IV SOLN
10.0000 mg | Freq: Once | INTRAVENOUS | Status: AC
  Administered 2022-04-17: 10 mg via INTRAVENOUS
  Filled 2022-04-17: qty 10

## 2022-04-17 MED ORDER — FOLIC ACID 1 MG PO TABS: 1.0000 mg | ORAL_TABLET | Freq: Every day | ORAL | 3 refills | Status: DC

## 2022-04-17 MED ORDER — SODIUM CHLORIDE 0.9 % IV SOLN
80.0000 mg/m2 | Freq: Once | INTRAVENOUS | Status: AC
  Administered 2022-04-17: 126 mg via INTRAVENOUS
  Filled 2022-04-17: qty 21

## 2022-04-17 MED ORDER — FAMOTIDINE IN NACL 20-0.9 MG/50ML-% IV SOLN
20.0000 mg | Freq: Once | INTRAVENOUS | Status: AC
  Administered 2022-04-17: 20 mg via INTRAVENOUS
  Filled 2022-04-17: qty 50

## 2022-04-17 MED ORDER — SODIUM CHLORIDE 0.9 % IV SOLN
109.8000 mg | Freq: Once | INTRAVENOUS | Status: AC
  Administered 2022-04-17: 110 mg via INTRAVENOUS
  Filled 2022-04-17: qty 11

## 2022-04-17 MED ORDER — DIPHENHYDRAMINE HCL 50 MG/ML IJ SOLN
50.0000 mg | Freq: Once | INTRAMUSCULAR | Status: AC
  Administered 2022-04-17: 50 mg via INTRAVENOUS
  Filled 2022-04-17: qty 1

## 2022-04-17 NOTE — Progress Notes (Signed)
Patient tolerated Cabo/ Taxol infusion well, no questions/concerns voiced. Patient stable at discharge. AVS given.   ?

## 2022-04-17 NOTE — Progress Notes (Signed)
Pt is aware of folic acid being sent to her pharmacy due to low count per Dr. Janese Banks. Pt understands and agrees.  ?

## 2022-04-17 NOTE — Patient Instructions (Signed)
[facility] CANCER CTR AT [place]  Discharge Instructions: ?Thank you for choosing [facility] to provide your oncology and hematology care.  ?If you have a lab appointment with the [facility], please go directly to the [facility] and check in at the registration area. ? ?Wear comfortable clothing and clothing appropriate for easy access to any Portacath or PICC line.  ? ?We strive to give you quality time with your provider. You may need to reschedule your appointment if you arrive late (15 or more minutes).  Arriving late affects you and other patients whose appointments are after yours.  Also, if you miss three or more appointments without notifying the office, you may be dismissed from the clinic at the provider?s discretion.    ?  ?For prescription refill requests, have your pharmacy contact our office and allow 72 hours for refills to be completed.   ? ?Today you received the following chemotherapy and/or immunotherapy agents: Carbo/Taxol ?  ?To help prevent nausea and vomiting after your treatment, we encourage you to take your nausea medication as directed. ? ?BELOW ARE SYMPTOMS THAT SHOULD BE REPORTED IMMEDIATELY: ?*FEVER GREATER THAN 100.4 F (38 ?C) OR HIGHER ?*CHILLS OR SWEATING ?*NAUSEA AND VOMITING THAT IS NOT CONTROLLED WITH YOUR NAUSEA MEDICATION ?*UNUSUAL SHORTNESS OF BREATH ?*UNUSUAL BRUISING OR BLEEDING ?*URINARY PROBLEMS (pain or burning when urinating, or frequent urination) ?*BOWEL PROBLEMS (unusual diarrhea, constipation, pain near the anus) ?TENDERNESS IN MOUTH AND THROAT WITH OR WITHOUT PRESENCE OF ULCERS (sore throat, sores in mouth, or a toothache) ?UNUSUAL RASH, SWELLING OR PAIN  ?UNUSUAL VAGINAL DISCHARGE OR ITCHING  ? ?Items with * indicate a potential emergency and should be followed up as soon as possible or go to the Emergency Department if any problems should occur. ? ?Please show the CHEMOTHERAPY ALERT CARD or IMMUNOTHERAPY ALERT CARD at check-in to  the Emergency Department and triage nurse. ? ?Should you have questions after your visit or need to cancel or reschedule your appointment, please contact [facility] CANCER [place] AT [place]  [phone] and follow the prompts.  Office hours are 8:00 a.m. to 4:30 p.m. Monday - Friday. Please note that voicemails left after 4:00 p.m. may not be returned until the following business day.  We are closed weekends and major holidays. You have access to a nurse at all times for urgent questions. Please call the main number to the clinic [phone] and follow the prompts. ? ?For any non-urgent questions, you may also contact your provider using MyChart. We now offer e-Visits for anyone 48 and older to request care online for non-urgent symptoms. For details visit [email or web address]. ?  ?Also download the MyChart app! Go to the app store, search "MyChart", open the app, select , and log in with your MyChart username and password. ? ?Due to Covid, a mask is required upon entering the hospital/clinic. If you do not have a mask, one will be given to you upon arrival. For doctor visits, patients may have 1 support person aged 10 or older with them. For treatment visits, patients cannot have anyone with them due to current Covid guidelines and our immunocompromised population.  ?

## 2022-04-17 NOTE — Progress Notes (Signed)
? ? ? ?Hematology/Oncology Consult note ?[place]  ?[phone] [phone] ? ?Patient Care Team: ?Frazier Richards, MD as PCP - General (Family Medicine) ?Kate Sable, MD as PCP - Cardiology (Cardiology) ?Theodore Demark, RN as Oncology Nurse Navigator ?Sindy Guadeloupe, MD as Consulting Physician (Oncology)  ? ?Name of the patient: Susan [name]  ?[id number]  ?[birth year]  ? ?Date of visit: 04/17/22 ? 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  ? ?Chief complaint/ Reason for visit-on treatment assessment prior to cycle 8 of neoadjuvant CarboTaxol chemotherapy ? ?Heme/Onc history: Patient is a 66 year old female who self palpated a right breast lump in February 2023 that led to a diagnostic right breast mammogram.  It showed a suspicious mass at the 5:30 position of the right breast 4 cm from the nipple measuring 1.8 x 1.6 x 1.2 cm.  There may be a thickened echogenic rim which would bring the measurement to 2.9 x 1.9 x 2.3 cm.  Overlying skin thickening.  No axillary adenopathy.  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  Patient referred for further management.  History obtained with the help of Spanish interpreter.  Patient is independent of her ADLs at baseline.She reports some baseline diabetic neuropathy for which she is on gabapentin. ?  ?Family history significant for breast cancer in her sister  in her 101s.  She is here with her daughter today.  Reports her appetite and weight haveRemained stable.  She is anxious about her next steps with regards to breast cancer. ?  ?Patient is being treated with neoadjuvant intent with keynote 522 regimen starting with CarboTaxol Keytruda chemotherapy on 02/27/2022 ?  ? ?Interval history-tolerating chemotherapy well so far.  Denies any tingling numbness in her hands and  feet.  Reports occasional left leg pain while she gets chemotherapy which then subsequently resolves.  Reports mild fatigue ? ?ECOG PS- 1 ?Pain scale- 0 ? ? ?Review of systems- Review of Systems  ?Constitutional:  Positive for malaise/fatigue. Negative for chills, fever and weight loss.  ?HENT:  Negative for congestion, ear discharge and nosebleeds.   ?Eyes:  Negative for blurred vision.  ?Respiratory:  Negative for cough, hemoptysis, sputum production, shortness of breath and wheezing.   ?Cardiovascular:  Negative for chest pain, palpitations, orthopnea and claudication.  ?Gastrointestinal:  Negative for abdominal pain, blood in stool, constipation, diarrhea, heartburn, melena, nausea and vomiting.  ?Genitourinary:  Negative for dysuria, flank pain, frequency, hematuria and urgency.  ?Musculoskeletal:  Negative for back pain, joint pain and myalgias.  ?Skin:  Negative for rash.  ?Neurological:  Negative for dizziness, tingling, focal weakness, seizures, weakness and headaches.  ?Endo/Heme/Allergies:  Does not bruise/bleed easily.  ?Psychiatric/Behavioral:  Negative for depression and suicidal ideas. The patient does not have insomnia.    ? ? ?Allergies  ?Allergen Reactions  ? Contrast Media  [Iodinated Contrast Media] Nausea And Vomiting and Other (See Comments)  ?  Pt was pre medicated 03/28/22 and 3 ML of contrast injected per [facility] A Cath check.  Pt reported no problems.   ? ? ? ?Past Medical History:  ?Diagnosis Date  ? Breast cancer, right breast ([place])   ? Diabetes mellitus without complication ([place])   ? Family history of prostate cancer   ? Gastritis   ? Heart murmur   ? History of pancreatitis   ? Hyperlipidemia   ?  Hypertension   ? Palpitations   ? ? ? ?Past Surgical History:  ?Procedure Laterality Date  ? BREAST BIOPSY Right 11/2015  ? benign  ? BREAST BIOPSY Right 01/24/2022  ? INVASIVE MAMMARY CARCINOMA, NO SPECIAL TYPE  ? BREAST CYST EXCISION Right   ? CESAREAN SECTION    ? 2x  ? CHOLECYSTECTOMY    ? IR  CV LINE INJECTION  03/28/2022  ? PORTACATH PLACEMENT N/A 03/01/2022  ? Procedure: INSERTION PORT-A-CATH;  Surgeon: Herbert Pun, MD;  Location: [facility] ORS;  Service: General;  Laterality: N/A;  ? ? ?Social History  ? ?Socioeconomic History  ? Marital status: Married  ?  Spouse name: Not on file  ? Number of children: Not on file  ? Years of education: Not on file  ? Highest education level: Not on file  ?Occupational History  ? Not on file  ?Tobacco Use  ? Smoking status: Never  ? Smokeless tobacco: Never  ?Vaping Use  ? Vaping Use: Never used  ?Substance and Sexual Activity  ? Alcohol use: Never  ? Drug use: Never  ? Sexual activity: Not on file  ?Other Topics Concern  ? Not on file  ?Social History Narrative  ? Not on file  ? ?Social Determinants of Health  ? ?Financial Resource Strain: Low Risk   ? Difficulty of Paying Living Expenses: Not very hard  ?Food Insecurity: No Food Insecurity  ? Worried About Charity fundraiser in the Last Year: Never true  ? Ran Out of Food in the Last Year: Never true  ?Transportation Needs: No Transportation Needs  ? Lack of Transportation (Medical): No  ? Lack of Transportation (Non-Medical): No  ?Physical Activity: Inactive  ? Days of Exercise per Week: 0 days  ? Minutes of Exercise per Session: 0 min  ?Stress: No Stress Concern Present  ? Feeling of Stress : Only a little  ?Social Connections: Moderately Integrated  ? Frequency of Communication with Friends and Family: Three times a week  ? Frequency of Social Gatherings with Friends and Family: Three times a week  ? Attends Religious Services: 1 to 4 times per year  ? Active Member of Clubs or Organizations: No  ? Attends Archivist Meetings: Never  ? Marital Status: Married  ?Intimate Partner Violence: Not At Risk  ? Fear of Current or Ex-Partner: No  ? Emotionally Abused: No  ? Physically Abused: No  ? Sexually Abused: No  ? ? ?Family History  ?Problem Relation Age of Onset  ? Heart disease Father   ? Breast  cancer Sister   ?     60s  ? Prostate cancer Paternal Grandfather   ? Stomach cancer Cousin   ? ? ? ?Current Outpatient Medications:  ?  aspirin 81 MG EC tablet, Take 1 tablet by mouth daily., Disp: , Rfl:  ?  atorvastatin (LIPITOR) 80 MG tablet, Take 80 mg by mouth daily., Disp: , Rfl:  ?  DULoxetine (CYMBALTA) 60 MG capsule, Take 60 mg by mouth daily., Disp: , Rfl:  ?  enalapril (VASOTEC) 20 MG tablet, Take 20 mg by mouth daily., Disp: , Rfl:  ?  fenofibrate (TRICOR) 145 MG tablet, Take 145 mg by mouth daily., Disp: , Rfl:  ?  fluticasone (FLONASE) 50 MCG/ACT nasal spray, Place 2 sprays into both nostrils daily as needed for allergies or rhinitis., Disp: , Rfl:  ?  gabapentin (NEURONTIN) 300 MG capsule, Take 300 mg by mouth in the morning and at bedtime.,  Disp: , Rfl:  ?  glipiZIDE (GLUCOTROL XL) 2.5 MG 24 hr tablet, Take 2.5 mg by mouth daily., Disp: , Rfl:  ?  hydrochlorothiazide (MICROZIDE) 12.5 MG capsule, Take 12.5 mg by mouth daily., Disp: , Rfl:  ?  lidocaine-prilocaine (EMLA) cream, Apply to affected area once, Disp: 30 g, Rfl: 3 ?  metoprolol succinate (TOPROL XL) 25 MG 24 hr tablet, Take 1 tablet (25 mg total) by mouth daily., Disp: 90 tablet, Rfl: 3 ?  predniSONE (DELTASONE) 10 MG tablet, Take by mouth., Disp: , Rfl:  ?  atorvastatin (LIPITOR) 40 MG tablet, Take by mouth. (Patient not taking: Reported on 04/17/2022), Disp: , Rfl:  ?  dexamethasone (DECADRON) 4 MG tablet, Take 2 tablets (8 mg total) by mouth daily. Start the day after chemotherapy for 2 days. (Patient not taking: Reported on 03/20/2022), Disp: 30 tablet, Rfl: 1 ?  LORazepam (ATIVAN) 0.5 MG tablet, Take 1 tablet (0.5 mg total) by mouth every 6 (six) hours as needed (Nausea or vomiting). (Patient not taking: Reported on 03/06/2022), Disp: 30 tablet, Rfl: 0 ?  meloxicam (MOBIC) 15 MG tablet, Take 15 mg by mouth daily as needed for pain. (Patient not taking: Reported on 03/20/2022), Disp: , Rfl:  ?  ondansetron (ZOFRAN) 8 MG tablet, Take 1 tablet  (8 mg total) by mouth 2 (two) times daily as needed for refractory nausea / vomiting. Start on day 3 after chemo. (Patient not taking: Reported on 03/20/2022), Disp: 30 tablet, Rfl: 1 ?  prochlorperazine (C

## 2022-04-21 MED FILL — Dexamethasone Sodium Phosphate Inj 100 MG/10ML: INTRAMUSCULAR | Qty: 1 | Status: AC

## 2022-04-24 ENCOUNTER — Inpatient Hospital Stay: Payer: Medicare HMO

## 2022-04-24 VITALS — BP 159/68 | HR 82 | Temp 98.0°F | Resp 16 | Ht 60.0 in | Wt 127.9 lb

## 2022-04-24 DIAGNOSIS — Z171 Estrogen receptor negative status [ER-]: Secondary | ICD-10-CM

## 2022-04-24 DIAGNOSIS — Z5112 Encounter for antineoplastic immunotherapy: Secondary | ICD-10-CM | POA: Diagnosis not present

## 2022-04-24 LAB — COMPREHENSIVE METABOLIC PANEL
ALT: 23 U/L (ref 0–44)
AST: 24 U/L (ref 15–41)
Albumin: 3.6 g/dL (ref 3.5–5.0)
Alkaline Phosphatase: 47 U/L (ref 38–126)
Anion gap: 6 (ref 5–15)
BUN: 20 mg/dL (ref 8–23)
CO2: 26 mmol/L (ref 22–32)
Calcium: 8.2 mg/dL — ABNORMAL LOW (ref 8.9–10.3)
Chloride: 103 mmol/L (ref 98–111)
Creatinine, Ser: 0.74 mg/dL (ref 0.44–1.00)
GFR, Estimated: >60 mL/min (ref >60)
Glucose, Bld: 223 mg/dL — ABNORMAL HIGH (ref 70–99)
Potassium: 4.1 mmol/L (ref 3.5–5.1)
Sodium: 135 mmol/L (ref 135–145)
Total Bilirubin: 0.6 mg/dL (ref 0.3–1.2)
Total Protein: 6.6 g/dL (ref 6.5–8.1)

## 2022-04-24 LAB — CBC WITH DIFFERENTIAL/PLATELET
Abs Immature Granulocytes: 0.07 10*3/uL (ref 0.00–0.07)
Basophils Absolute: 0 10*3/uL (ref 0.0–0.1)
Basophils Relative: 1 %
Eosinophils Absolute: 0 10*3/uL (ref 0.0–0.5)
Eosinophils Relative: 1 %
HCT: 28.5 % — ABNORMAL LOW (ref 36.0–46.0)
Hemoglobin: 9.7 g/dL — ABNORMAL LOW (ref 12.0–15.0)
Immature Granulocytes: 2 %
Lymphocytes Relative: 32 %
Lymphs Abs: 1.4 10*3/uL (ref 0.7–4.0)
MCH: 32.9 pg (ref 26.0–34.0)
MCHC: 34 g/dL (ref 30.0–36.0)
MCV: 96.6 fL (ref 80.0–100.0)
Monocytes Absolute: 0.3 10*3/uL (ref 0.1–1.0)
Monocytes Relative: 7 %
Neutro Abs: 2.6 10*3/uL (ref 1.7–7.7)
Neutrophils Relative %: 57 %
Platelets: 274 10*3/uL (ref 150–400)
RBC: 2.95 MIL/uL — ABNORMAL LOW (ref 3.87–5.11)
RDW: 14.8 % (ref 11.5–15.5)
WBC: 4.5 10*3/uL (ref 4.0–10.5)
nRBC: 0.4 % — ABNORMAL HIGH (ref 0.0–0.2)

## 2022-04-24 LAB — VITAMIN B12: Vitamin B-12: 196 pg/mL (ref 180–914)

## 2022-04-24 MED ORDER — SODIUM CHLORIDE 0.9 % IV SOLN
80.0000 mg/m2 | Freq: Once | INTRAVENOUS | Status: AC
  Administered 2022-04-24: 126 mg via INTRAVENOUS
  Filled 2022-04-24: qty 21

## 2022-04-24 MED ORDER — HEPARIN SOD (PORK) LOCK FLUSH 100 UNIT/ML IV SOLN
500.0000 [IU] | Freq: Once | INTRAVENOUS | Status: DC | PRN
  Filled 2022-04-24: qty 5

## 2022-04-24 MED ORDER — HEPARIN SOD (PORK) LOCK FLUSH 100 UNIT/ML IV SOLN
500.0000 [IU] | Freq: Once | INTRAVENOUS | Status: AC
  Administered 2022-04-24: 500 [IU] via INTRAVENOUS
  Filled 2022-04-24: qty 5

## 2022-04-24 MED ORDER — SODIUM CHLORIDE 0.9 % IV SOLN
10.0000 mg | Freq: Once | INTRAVENOUS | Status: AC
  Administered 2022-04-24: 10 mg via INTRAVENOUS
  Filled 2022-04-24: qty 10

## 2022-04-24 MED ORDER — SODIUM CHLORIDE 0.9 % IV SOLN
Freq: Once | INTRAVENOUS | Status: AC
  Filled 2022-04-24: qty 250

## 2022-04-24 MED ORDER — SODIUM CHLORIDE 0.9% FLUSH
10.0000 mL | Freq: Once | INTRAVENOUS | Status: AC
  Administered 2022-04-24: 10 mL via INTRAVENOUS
  Filled 2022-04-24: qty 10

## 2022-04-24 MED ORDER — DIPHENHYDRAMINE HCL 50 MG/ML IJ SOLN
50.0000 mg | Freq: Once | INTRAMUSCULAR | Status: AC
  Administered 2022-04-24: 50 mg via INTRAVENOUS
  Filled 2022-04-24: qty 1

## 2022-04-24 MED ORDER — FAMOTIDINE IN NACL 20-0.9 MG/50ML-% IV SOLN
20.0000 mg | Freq: Once | INTRAVENOUS | Status: AC
  Administered 2022-04-24: 20 mg via INTRAVENOUS
  Filled 2022-04-24: qty 50

## 2022-04-24 MED ORDER — PALONOSETRON HCL INJECTION 0.25 MG/5ML
0.2500 mg | Freq: Once | INTRAVENOUS | Status: AC
  Administered 2022-04-24: 0.25 mg via INTRAVENOUS
  Filled 2022-04-24: qty 5

## 2022-04-24 MED ORDER — SODIUM CHLORIDE 0.9 % IV SOLN
109.8000 mg | Freq: Once | INTRAVENOUS | Status: AC
  Administered 2022-04-24: 110 mg via INTRAVENOUS
  Filled 2022-04-24: qty 11

## 2022-04-24 NOTE — Patient Instructions (Signed)
[facility] CANCER CTR AT [place]  Discharge Instructions: Thank you for choosing [facility] to provide your oncology and hematology care.  If you have a lab appointment with the [facility], please go directly to the [facility] and check in at the registration area.  Wear comfortable clothing and clothing appropriate for easy access to any Portacath or PICC line.   We strive to give you quality time with your provider. You may need to reschedule your appointment if you arrive late (15 or more minutes).  Arriving late affects you and other patients whose appointments are after yours.  Also, if you miss three or more appointments without notifying the office, you may be dismissed from the clinic at the provider's discretion.      For prescription refill requests, have your pharmacy contact our office and allow 72 hours for refills to be completed.    Today you received the following chemotherapy and/or immunotherapy agents Carboplatin, Taxol      To help prevent nausea and vomiting after your treatment, we encourage you to take your nausea medication as directed.  BELOW ARE SYMPTOMS THAT SHOULD BE REPORTED IMMEDIATELY: *FEVER GREATER THAN 100.4 F (38 C) OR HIGHER *CHILLS OR SWEATING *NAUSEA AND VOMITING THAT IS NOT CONTROLLED WITH YOUR NAUSEA MEDICATION *UNUSUAL SHORTNESS OF BREATH *UNUSUAL BRUISING OR BLEEDING *URINARY PROBLEMS (pain or burning when urinating, or frequent urination) *BOWEL PROBLEMS (unusual diarrhea, constipation, pain near the anus) TENDERNESS IN MOUTH AND THROAT WITH OR WITHOUT PRESENCE OF ULCERS (sore throat, sores in mouth, or a toothache) UNUSUAL RASH, SWELLING OR PAIN  UNUSUAL VAGINAL DISCHARGE OR ITCHING   Items with * indicate a potential emergency and should be followed up as soon as possible or go to the Emergency Department if any problems should occur.  Please show the CHEMOTHERAPY ALERT CARD or IMMUNOTHERAPY ALERT CARD at  check-in to the Emergency Department and triage nurse.  Should you have questions after your visit or need to cancel or reschedule your appointment, please contact [facility] CANCER [place] AT [place]  [phone] and follow the prompts.  Office hours are 8:00 a.m. to 4:30 p.m. Monday - Friday. Please note that voicemails left after 4:00 p.m. may not be returned until the following business day.  We are closed weekends and major holidays. You have access to a nurse at all times for urgent questions. Please call the main number to the clinic [phone] and follow the prompts.  For any non-urgent questions, you may also contact your provider using MyChart. We now offer e-Visits for anyone 36 and older to request care online for non-urgent symptoms. For details visit [email or web address].   Also download the MyChart app! Go to the app store, search "MyChart", open the app, select [place], and log in with your MyChart username and password.  Due to Covid, a mask is required upon entering the hospital/clinic. If you do not have a mask, one will be given to you upon arrival. For doctor visits, patients may have 1 support person aged 70 or older with them. For treatment visits, patients cannot have anyone with them due to current Covid guidelines and our immunocompromised population.

## 2022-04-27 ENCOUNTER — Telehealth: Payer: Self-pay | Admitting: *Deleted

## 2022-04-27 ENCOUNTER — Encounter: Payer: Self-pay | Admitting: Oncology

## 2022-04-27 NOTE — Telephone Encounter (Signed)
Message on voice mail form a female caller reporting that patient is having bruising on her knuckles which has become painful. Patient had chemotherapy 04/24/22 Please advise  CBC with Differential Order: [id number] Status: Final result    Visible to patient: No (inaccessible in [place])    Next appt: 05/02/2022 at 08:45 AM in Oncology ([facility])    Dx: Malignant neoplasm of lower-inner qua...    0 Result Notes           Component Ref Range & Units 3 d ago (04/24/22) 10 d ago (04/17/22) 2 wk ago (04/10/22) 3 wk ago (04/03/22) 1 mo ago (03/27/22) 1 mo ago (03/20/22) 1 mo ago (03/13/22)  WBC 4.0 - 10.5 K/uL 4.5  4.3  4.5  5.4  4.5  4.9  4.8   RBC 3.87 - 5.11 MIL/uL 2.95 Low   2.85 Low   3.03 Low   3.18 Low   3.26 Low   3.40 Low   3.67 Low    Hemoglobin 12.0 - 15.0 g/dL 9.7 Low   9.3 Low   10.1 Low   10.3 Low   10.6 Low   11.1 Low   12.0   HCT 36.0 - 46.0 % 28.5 Low   27.7 Low   29.0 Low   30.4 Low   31.6 Low   32.6 Low   35.4 Low    MCV 80.0 - 100.0 fL 96.6  97.2  95.7  95.6  96.9  95.9  96.5   MCH 26.0 - 34.0 pg 32.9  32.6  33.3  32.4  32.5  32.6  32.7   MCHC 30.0 - 36.0 g/dL 34.0  33.6  34.8  33.9  33.5  34.0  33.9   RDW 11.5 - 15.5 % 14.8  14.3  13.8  13.3  13.1  12.8  12.9   Platelets 150 - 400 K/uL 274  258  248  213  226  256  329   nRBC 0.0 - 0.2 % 0.4 High   0.0  0.0  0.0  0.0  0.0  0.0   Neutrophils Relative % % 57  61  62  59  63  61  58   Neutro Abs 1.7 - 7.7 K/uL 2.6  2.6  2.7  3.2  2.8  3.1  2.8   Lymphocytes Relative % 32  '30  28  31  29  30  '$ 32   Lymphs Abs 0.7 - 4.0 K/uL 1.4  1.3  1.3  1.7  1.3  1.5  1.5   Monocytes Relative % '7  6  7  6  6  5  6   '$ Monocytes Absolute 0.1 - 1.0 K/uL 0.3  0.3  0.3  0.3  0.3  0.2  0.3   Eosinophils Relative % '1  1  1  1  1  2  3   '$ Eosinophils Absolute 0.0 - 0.5 K/uL 0.0  0.0  0.1  0.1  0.1  0.1  0.1   Basophils Relative % '1  1  1  1  1  1  1   '$ Basophils Absolute 0.0 - 0.1 K/uL 0.0  0.0  0.1  0.0  0.0  0.1  0.0   Immature Granulocytes % '2  1   1  2  '$ 0  1  0   Abs Immature Granulocytes 0.00 - 0.07 K/uL 0.07  0.04 CM  0.05 CM  0.09 High  CM  0.02 CM  0.03 CM  0.02 CM   Comment: Performed at [facility], [address] [place], [place] [address]  Resulting Agency  [facility] CLIN LAB [place] CLIN LAB [place] CLIN LAB [place] CLIN LAB [place] CLIN LAB [place] CLIN LAB [facility] CLIN LAB         Specimen Collected: 04/24/22 08:09 Last Resulted: 04/24/22 08:35

## 2022-04-27 NOTE — Telephone Encounter (Signed)
I have forwarded the my chart from pt. To [employer]

## 2022-04-27 NOTE — Telephone Encounter (Signed)
Probably just ice the area but nothing else to do. But if she wants to get it checked out, she can see smc

## 2022-04-28 MED FILL — Dexamethasone Sodium Phosphate Inj 100 MG/10ML: INTRAMUSCULAR | Qty: 1 | Status: AC

## 2022-04-28 NOTE — Telephone Encounter (Signed)
Attempted to reach patient/family. No answer, left voicemail to return my call.

## 2022-05-02 ENCOUNTER — Inpatient Hospital Stay (HOSPITAL_BASED_OUTPATIENT_CLINIC_OR_DEPARTMENT_OTHER): Payer: Medicare HMO | Admitting: Oncology

## 2022-05-02 ENCOUNTER — Inpatient Hospital Stay: Payer: Medicare HMO

## 2022-05-02 ENCOUNTER — Encounter: Payer: Self-pay | Admitting: Oncology

## 2022-05-02 ENCOUNTER — Other Ambulatory Visit: Payer: Self-pay | Admitting: *Deleted

## 2022-05-02 VITALS — BP 132/61 | HR 81

## 2022-05-02 VITALS — BP 132/64 | HR 84 | Temp 99.2°F | Resp 14 | Wt 125.7 lb

## 2022-05-02 DIAGNOSIS — Z171 Estrogen receptor negative status [ER-]: Secondary | ICD-10-CM | POA: Diagnosis not present

## 2022-05-02 DIAGNOSIS — D519 Vitamin B12 deficiency anemia, unspecified: Secondary | ICD-10-CM | POA: Insufficient documentation

## 2022-05-02 DIAGNOSIS — C50311 Malignant neoplasm of lower-inner quadrant of right female breast: Secondary | ICD-10-CM

## 2022-05-02 DIAGNOSIS — Z5111 Encounter for antineoplastic chemotherapy: Secondary | ICD-10-CM

## 2022-05-02 DIAGNOSIS — D518 Other vitamin B12 deficiency anemias: Secondary | ICD-10-CM

## 2022-05-02 DIAGNOSIS — Z5112 Encounter for antineoplastic immunotherapy: Secondary | ICD-10-CM | POA: Diagnosis not present

## 2022-05-02 LAB — CBC WITH DIFFERENTIAL/PLATELET
Abs Immature Granulocytes: 0.15 10*3/uL — ABNORMAL HIGH (ref 0.00–0.07)
Basophils Absolute: 0 10*3/uL (ref 0.0–0.1)
Basophils Relative: 1 %
Eosinophils Absolute: 0 10*3/uL (ref 0.0–0.5)
Eosinophils Relative: 0 %
HCT: 28.1 % — ABNORMAL LOW (ref 36.0–46.0)
Hemoglobin: 9.5 g/dL — ABNORMAL LOW (ref 12.0–15.0)
Immature Granulocytes: 3 %
Lymphocytes Relative: 28 %
Lymphs Abs: 1.3 10*3/uL (ref 0.7–4.0)
MCH: 33.1 pg (ref 26.0–34.0)
MCHC: 33.8 g/dL (ref 30.0–36.0)
MCV: 97.9 fL (ref 80.0–100.0)
Monocytes Absolute: 0.4 10*3/uL (ref 0.1–1.0)
Monocytes Relative: 9 %
Neutro Abs: 2.8 10*3/uL (ref 1.7–7.7)
Neutrophils Relative %: 59 %
Platelets: 264 10*3/uL (ref 150–400)
RBC: 2.87 MIL/uL — ABNORMAL LOW (ref 3.87–5.11)
RDW: 15.5 % (ref 11.5–15.5)
WBC: 4.7 10*3/uL (ref 4.0–10.5)
nRBC: 0.8 % — ABNORMAL HIGH (ref 0.0–0.2)

## 2022-05-02 LAB — COMPREHENSIVE METABOLIC PANEL
ALT: 19 U/L (ref 0–44)
AST: 20 U/L (ref 15–41)
Albumin: 3.6 g/dL (ref 3.5–5.0)
Alkaline Phosphatase: 47 U/L (ref 38–126)
Anion gap: 7 (ref 5–15)
BUN: 21 mg/dL (ref 8–23)
CO2: 28 mmol/L (ref 22–32)
Calcium: 8.6 mg/dL — ABNORMAL LOW (ref 8.9–10.3)
Chloride: 101 mmol/L (ref 98–111)
Creatinine, Ser: 0.68 mg/dL (ref 0.44–1.00)
GFR, Estimated: >60 mL/min (ref >60)
Glucose, Bld: 199 mg/dL — ABNORMAL HIGH (ref 70–99)
Potassium: 3.6 mmol/L (ref 3.5–5.1)
Sodium: 136 mmol/L (ref 135–145)
Total Bilirubin: 0.3 mg/dL (ref 0.3–1.2)
Total Protein: 6.3 g/dL — ABNORMAL LOW (ref 6.5–8.1)

## 2022-05-02 MED ORDER — SODIUM CHLORIDE 0.9 % IV SOLN
10.0000 mg | Freq: Once | INTRAVENOUS | Status: AC
  Administered 2022-05-02: 10 mg via INTRAVENOUS
  Filled 2022-05-02: qty 10

## 2022-05-02 MED ORDER — DESOXIMETASONE 0.05 % EX OINT: 1.0000 | TOPICAL_OINTMENT | Freq: Three times a day (TID) | CUTANEOUS | 1 refills | Status: DC | PRN

## 2022-05-02 MED ORDER — HEPARIN SOD (PORK) LOCK FLUSH 100 UNIT/ML IV SOLN
500.0000 [IU] | Freq: Once | INTRAVENOUS | Status: AC | PRN
  Administered 2022-05-02: 500 [IU]
  Filled 2022-05-02: qty 5

## 2022-05-02 MED ORDER — SODIUM CHLORIDE 0.9 % IV SOLN
Freq: Once | INTRAVENOUS | Status: AC
  Filled 2022-05-02: qty 250

## 2022-05-02 MED ORDER — SODIUM CHLORIDE 0.9 % IV SOLN
109.8000 mg | Freq: Once | INTRAVENOUS | Status: AC
  Administered 2022-05-02: 110 mg via INTRAVENOUS
  Filled 2022-05-02: qty 11

## 2022-05-02 MED ORDER — DIPHENHYDRAMINE HCL 50 MG/ML IJ SOLN
50.0000 mg | Freq: Once | INTRAMUSCULAR | Status: AC
  Administered 2022-05-02: 50 mg via INTRAVENOUS
  Filled 2022-05-02: qty 1

## 2022-05-02 MED ORDER — PALONOSETRON HCL INJECTION 0.25 MG/5ML
0.2500 mg | Freq: Once | INTRAVENOUS | Status: AC
  Administered 2022-05-02: 0.25 mg via INTRAVENOUS
  Filled 2022-05-02: qty 5

## 2022-05-02 MED ORDER — SODIUM CHLORIDE 0.9 % IV SOLN
65.0000 mg/m2 | Freq: Once | INTRAVENOUS | Status: AC
  Administered 2022-05-02: 102 mg via INTRAVENOUS
  Filled 2022-05-02: qty 17

## 2022-05-02 MED ORDER — CYANOCOBALAMIN 1000 MCG/ML IJ SOLN
1000.0000 ug | Freq: Once | INTRAMUSCULAR | Status: AC
  Administered 2022-05-02: 1000 ug via INTRAMUSCULAR
  Filled 2022-05-02: qty 1

## 2022-05-02 MED ORDER — FAMOTIDINE IN NACL 20-0.9 MG/50ML-% IV SOLN
20.0000 mg | Freq: Once | INTRAVENOUS | Status: AC
  Administered 2022-05-02: 20 mg via INTRAVENOUS
  Filled 2022-05-02: qty 50

## 2022-05-02 MED ORDER — SODIUM CHLORIDE 0.9 % IV SOLN
200.0000 mg | Freq: Once | INTRAVENOUS | Status: AC
  Administered 2022-05-02: 200 mg via INTRAVENOUS
  Filled 2022-05-02: qty 200

## 2022-05-02 NOTE — Progress Notes (Signed)
Pt states she is experiencing a rash on her arms and hands; itchy. has used calamine lotion but skin dries her out; so she has switched to a moisturing creame and benadryll.

## 2022-05-02 NOTE — Progress Notes (Signed)
   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  It showed a suspicious mass at the 5:30 position of the right breast 4 cm from the nipple measuring 1.8 x 1.6 x 1.2 cm.  There may be a thickened echogenic rim which would bring the measurement to 2.9 x 1.9 x 2.3 cm.  Overlying skin thickening.  No axillary adenopathy.  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  Patient referred for further management.  History obtained with the help of Spanish interpreter.  Patient is independent of her ADLs at baseline.She reports some baseline diabetic neuropathy for which she is on gabapentin.   Family history significant for breast cancer in her sister  in her 30s.  She is here with her daughter today.  Reports her appetite and weight haveRemained stable.  She is anxious about her next steps with regards to breast cancer.   Patient is being treated with neoadjuvant intent with keynote 522 regimen starting with CarboTaxol Keytruda chemotherapy on 02/27/2022    Interval history-tolerating chemotherapy well so far.  She does have an erythematous rash over  her bilateral knuckles as well as back of her left foot.  Her prior areas of rash over her bilateral arms appear to be healing.  Denies other complaints at this time.  History obtained with the help of Spanish interpreter.  ECOG PS- 1 Pain scale- 0   Review of systems- Review of Systems  Constitutional:  Positive for malaise/fatigue. Negative for chills, fever and weight loss.  HENT:  Negative for congestion, ear discharge and nosebleeds.   Eyes:  Negative for blurred vision.  Respiratory:  Negative for cough, hemoptysis, sputum production, shortness of breath and wheezing.   Cardiovascular:  Negative for chest pain, palpitations, orthopnea and claudication.  Gastrointestinal:  Negative for abdominal pain, blood in stool, constipation, diarrhea, heartburn, melena, nausea and vomiting.  Genitourinary:  Negative for dysuria, flank pain, frequency, hematuria and urgency.  Musculoskeletal:  Negative for back pain, joint pain and myalgias.  Skin:  Positive for rash.  Neurological:  Negative for dizziness, tingling, focal weakness, seizures, weakness and headaches.  Endo/Heme/Allergies:  Does not bruise/bleed easily.  Psychiatric/Behavioral:  Negative for depression and suicidal ideas. The patient does not have insomnia.      Allergies  Allergen Reactions   Contrast Media  [Iodinated Contrast Media] Nausea And Vomiting and Other (See Comments)    Pt was pre medicated 03/28/22 and 3 ML of contrast injected per Port A Cath check.  Pt reported no problems.      Past Medical History:  Diagnosis Date   Breast cancer, right breast (HCC)    Diabetes mellitus without complication (HCC)    Family history   of prostate cancer    Gastritis    Heart murmur    History of pancreatitis    Hyperlipidemia    Hypertension    Palpitations      Past Surgical History:  Procedure Laterality Date   BREAST BIOPSY Right 11/2015   benign   BREAST BIOPSY Right 01/24/2022   INVASIVE MAMMARY CARCINOMA, NO  SPECIAL TYPE   BREAST CYST EXCISION Right    CESAREAN SECTION     2x   CHOLECYSTECTOMY     IR CV LINE INJECTION  03/28/2022   PORTACATH PLACEMENT N/A 03/01/2022   Procedure: INSERTION PORT-A-CATH;  Surgeon: Herbert Pun, MD;  Location: [facility] ORS;  Service: General;  Laterality: N/A;    Social History   Socioeconomic History   Marital status: Married    Spouse name: Not on file   Number of children: Not on file   Years of education: Not on file   Highest education level: Not on file  Occupational History   Not on file  Tobacco Use   Smoking status: Never   Smokeless tobacco: Never  Vaping Use   Vaping Use: Never used  Substance and Sexual Activity   Alcohol use: Never   Drug use: Never   Sexual activity: Not on file  Other Topics Concern   Not on file  Social History Narrative   Not on file   Social Determinants of Health   Financial Resource Strain: Low Risk    Difficulty of Paying Living Expenses: Not very hard  Food Insecurity: No Food Insecurity   Worried About Running Out of Food in the Last Year: Never true   [place] in the Last Year: Never true  Transportation Needs: No Transportation Needs   Lack of Transportation (Medical): No   Lack of Transportation (Non-Medical): No  Physical Activity: Inactive   Days of Exercise per Week: 0 days   Minutes of Exercise per Session: 0 min  Stress: No Stress Concern Present   Feeling of Stress : Only a little  Social Connections: Moderately Integrated   Frequency of Communication with Friends and Family: Three times a week   Frequency of Social Gatherings with Friends and Family: Three times a week   Attends Religious Services: 1 to 4 times per year   Active Member of Clubs or Organizations: No   Attends Archivist Meetings: Never   Marital Status: Married  Human resources officer Violence: Not At Risk   Fear of Current or Ex-Partner: No   Emotionally Abused: No   Physically Abused: No   Sexually  Abused: No    Family History  Problem Relation Age of Onset   Heart disease Father    Breast cancer Sister        70s   Prostate cancer Paternal Grandfather    Stomach cancer Cousin      Current Outpatient Medications:    aspirin 81 MG EC tablet, Take 1 tablet by mouth daily., Disp: , Rfl:    atorvastatin (LIPITOR) 80 MG tablet, Take 80 mg by mouth daily., Disp: , Rfl:    DULoxetine (CYMBALTA) 60 MG capsule, Take 60 mg by mouth daily., Disp: , Rfl:    enalapril (VASOTEC) 20 MG tablet, Take 20 mg by mouth daily., Disp: , Rfl:    fenofibrate (TRICOR) 145 MG tablet, Take 145 mg by mouth daily., Disp: , Rfl:    fluticasone (FLONASE) 50 MCG/ACT nasal spray, Place 2 sprays into both nostrils daily as needed for  allergies or rhinitis., Disp: , Rfl:    folic acid (FOLVITE) 1 MG tablet, Take 1 tablet (1 mg total) by mouth daily., Disp: 30 tablet, Rfl: 3   gabapentin (NEURONTIN) 300 MG capsule, Take 300 mg by mouth in the morning and at bedtime., Disp: , Rfl:    glipiZIDE (GLUCOTROL XL) 2.5 MG 24 hr tablet, Take 2.5 mg by mouth daily., Disp: , Rfl:    hydrochlorothiazide (MICROZIDE) 12.5 MG capsule, Take 12.5 mg by mouth daily., Disp: , Rfl:    lidocaine-prilocaine (EMLA) cream, Apply to affected area once, Disp: 30 g, Rfl: 3   metoprolol succinate (TOPROL XL) 25 MG 24 hr tablet, Take 1 tablet (25 mg total) by mouth daily., Disp: 90 tablet, Rfl: 3   predniSONE (DELTASONE) 10 MG tablet, Take by mouth., Disp: , Rfl:    dexamethasone (DECADRON) 4 MG tablet, Take 2 tablets (8 mg total) by mouth daily. Start the day after chemotherapy for 2 days. (Patient not taking: Reported on 05/02/2022), Disp: 30 tablet, Rfl: 1   LORazepam (ATIVAN) 0.5 MG tablet, Take 1 tablet (0.5 mg total) by mouth every 6 (six) hours as needed (Nausea or vomiting). (Patient not taking: Reported on 03/06/2022), Disp: 30 tablet, Rfl: 0   meloxicam (MOBIC) 15 MG tablet, Take 15 mg by mouth daily as needed for pain. (Patient not  taking: Reported on 03/20/2022), Disp: , Rfl:    ondansetron (ZOFRAN) 8 MG tablet, Take 1 tablet (8 mg total) by mouth 2 (two) times daily as needed for refractory nausea / vomiting. Start on day 3 after chemo. (Patient not taking: Reported on 03/20/2022), Disp: 30 tablet, Rfl: 1   prochlorperazine (COMPAZINE) 10 MG tablet, Take 1 tablet (10 mg total) by mouth every 6 (six) hours as needed (Nausea or vomiting). (Patient not taking: Reported on 03/06/2022), Disp: 30 tablet, Rfl: 1   tiZANidine (ZANAFLEX) 4 MG tablet, Take 4 mg by mouth every 8 (eight) hours as needed for muscle spasms. (Patient not taking: Reported on 03/20/2022), Disp: , Rfl:    traMADol (ULTRAM) 50 MG tablet, Take 1 tablet (50 mg total) by mouth every 6 (six) hours as needed. (Patient not taking: Reported on 03/20/2022), Disp: 10 tablet, Rfl: 0   vitamin C (ASCORBIC ACID) 500 MG tablet, Take 500 mg by mouth daily. (Patient not taking: Reported on 03/20/2022), Disp: , Rfl:   Physical exam:  Vitals:   05/02/22 0917  BP: 132/64  Pulse: 84  Resp: 14  Temp: 99.2 F (37.3 C)  SpO2: 100%  Weight: 125 lb 11.2 oz (57 kg)   Physical Exam Constitutional:      General: She is not in acute distress. Cardiovascular:     Rate and Rhythm: Normal rate and regular rhythm.     Heart sounds: Normal heart sounds.  Pulmonary:     Effort: Pulmonary effort is normal.     Breath sounds: Normal breath sounds.  Abdominal:     General: Bowel sounds are normal.     Palpations: Abdomen is soft.  Skin:    General: Skin is warm and dry.     Comments: Erythematous macular rash noted over bilateral knuckles.  Skin rash over bilateral arms appear to be healing.  There is also an oval area noted over the posterior aspect of the left foot  Neurological:     Mental Status: She is alert and oriented to person, place, and time.        Latest Ref Rng & Units 05/02/2022      8:51 AM  CMP  Glucose 70 - 99 mg/dL 199    BUN 8 - 23 mg/dL 21    Creatinine 0.44 -  1.00 mg/dL 0.68    Sodium 135 - 145 mmol/L 136    Potassium 3.5 - 5.1 mmol/L 3.6    Chloride 98 - 111 mmol/L 101    CO2 22 - 32 mmol/L 28    Calcium 8.9 - 10.3 mg/dL 8.6    Total Protein 6.5 - 8.1 g/dL 6.3    Total Bilirubin 0.3 - 1.2 mg/dL 0.3    Alkaline Phos 38 - 126 U/L 47    AST 15 - 41 U/L 20    ALT 0 - 44 U/L 19        Latest Ref Rng & Units 05/02/2022    8:51 AM  CBC  WBC 4.0 - 10.5 K/uL 4.7    Hemoglobin 12.0 - 15.0 g/dL 9.5    Hematocrit 36.0 - 46.0 % 28.1    Platelets 150 - 400 K/uL 264       Assessment and plan- Patient is a 66 y.o. female  with triple negative breast cancer stage IIb and cT2 N0 M0.  She is here for on treatment assessment prior to cycle 10 of neoadjuvant CarboTaxol Keytruda chemotherapy  Counts okay to proceed with cycle 10 of neoadjuvant CarboTaxol Keytruda chemotherapy today.  She will directly proceed for cycle 11 of CarboTaxol chemotherapy in 1 week and I will see her back in 2 weeks for cycle 12.  Following that we will get an interim ultrasound to assess response to treatment and following that patient will proceed for [facility] chemotherapy which will be given every 3 weeks for 4 cycles.  Normocytic anemia: Hemoglobin was close to 12 prior to chemotherapy and is drifted down to 9.5 today.  B12 levels are low at 196 and also probably contributing to some extent.  We will give her monthly B12 injections after giving her weekly injections x4.  Skin rash: Possibly secondary to Taxol versus Keytruda.  Topical emollient creams are not helping.  I will send her a prescription for topical steroid cream   Visit Diagnosis 1. Malignant neoplasm of lower-inner quadrant of right breast of female, estrogen receptor negative ([place])   2. Encounter for antineoplastic chemotherapy   3. Encounter for antineoplastic immunotherapy      Dr. Randa Evens, MD, MPH [facility] at [facility] [phone] 05/02/2022 4:51 PM

## 2022-05-02 NOTE — Patient Instructions (Signed)
[facility] CANCER CTR AT [place]  Discharge Instructions: Thank you for choosing [facility] to provide your oncology and hematology care.  If you have a lab appointment with the [facility], please go directly to the [place] and check in at the registration area.  Wear comfortable clothing and clothing appropriate for easy access to any Portacath or PICC line.   We strive to give you quality time with your provider. You may need to reschedule your appointment if you arrive late (15 or more minutes).  Arriving late affects you and other patients whose appointments are after yours.  Also, if you miss three or more appointments without notifying the office, you may be dismissed from the clinic at the provider's discretion.      For prescription refill requests, have your pharmacy contact our office and allow 72 hours for refills to be completed.    Today you received the following chemotherapy and/or immunotherapy agents: Keytruda, Taxol, Carboplatin.   To help prevent nausea and vomiting after your treatment, we encourage you to take your nausea medication as directed.  BELOW ARE SYMPTOMS THAT SHOULD BE REPORTED IMMEDIATELY: *FEVER GREATER THAN 100.4 F (38 C) OR HIGHER *CHILLS OR SWEATING *NAUSEA AND VOMITING THAT IS NOT CONTROLLED WITH YOUR NAUSEA MEDICATION *UNUSUAL SHORTNESS OF BREATH *UNUSUAL BRUISING OR BLEEDING *URINARY PROBLEMS (pain or burning when urinating, or frequent urination) *BOWEL PROBLEMS (unusual diarrhea, constipation, pain near the anus) TENDERNESS IN MOUTH AND THROAT WITH OR WITHOUT PRESENCE OF ULCERS (sore throat, sores in mouth, or a toothache) UNUSUAL RASH, SWELLING OR PAIN  UNUSUAL VAGINAL DISCHARGE OR ITCHING   Items with * indicate a potential emergency and should be followed up as soon as possible or go to the Emergency Department if any problems should occur.  Please show the CHEMOTHERAPY ALERT CARD or IMMUNOTHERAPY ALERT  CARD at check-in to the Emergency Department and triage nurse.  Should you have questions after your visit or need to cancel or reschedule your appointment, please contact [facility] CANCER [place] AT [place]  [phone] and follow the prompts.  Office hours are 8:00 a.m. to 4:30 p.m. Monday - Friday. Please note that voicemails left after 4:00 p.m. may not be returned until the following business day.  We are closed weekends and major holidays. You have access to a nurse at all times for urgent questions. Please call the main number to the clinic [phone] and follow the prompts.  For any non-urgent questions, you may also contact your provider using MyChart. We now offer e-Visits for anyone 78 and older to request care online for non-urgent symptoms. For details visit [email or web address].   Also download the MyChart app! Go to the app store, search "MyChart", open the app, select [place], and log in with your MyChart username and password.  Due to Covid, a mask is required upon entering the hospital/clinic. If you do not have a mask, one will be given to you upon arrival. For doctor visits, patients may have 1 support person aged 86 or older with them. For treatment visits, patients cannot have anyone with them due to current Covid guidelines and our immunocompromised population.

## 2022-05-05 MED FILL — Dexamethasone Sodium Phosphate Inj 100 MG/10ML: INTRAMUSCULAR | Qty: 1 | Status: AC

## 2022-05-08 ENCOUNTER — Inpatient Hospital Stay: Payer: Medicare HMO | Attending: Oncology

## 2022-05-08 ENCOUNTER — Inpatient Hospital Stay: Payer: Medicare HMO

## 2022-05-08 VITALS — BP 147/70 | HR 85 | Temp 98.3°F | Resp 18 | Ht 60.0 in | Wt 126.8 lb

## 2022-05-08 DIAGNOSIS — C50311 Malignant neoplasm of lower-inner quadrant of right female breast: Secondary | ICD-10-CM | POA: Diagnosis not present

## 2022-05-08 DIAGNOSIS — Z79899 Other long term (current) drug therapy: Secondary | ICD-10-CM | POA: Insufficient documentation

## 2022-05-08 DIAGNOSIS — E538 Deficiency of other specified B group vitamins: Secondary | ICD-10-CM | POA: Insufficient documentation

## 2022-05-08 DIAGNOSIS — D518 Other vitamin B12 deficiency anemias: Secondary | ICD-10-CM

## 2022-05-08 DIAGNOSIS — Z171 Estrogen receptor negative status [ER-]: Secondary | ICD-10-CM | POA: Diagnosis not present

## 2022-05-08 DIAGNOSIS — D6481 Anemia due to antineoplastic chemotherapy: Secondary | ICD-10-CM | POA: Insufficient documentation

## 2022-05-08 DIAGNOSIS — Z5111 Encounter for antineoplastic chemotherapy: Secondary | ICD-10-CM | POA: Diagnosis present

## 2022-05-08 LAB — COMPREHENSIVE METABOLIC PANEL
ALT: 24 U/L (ref 0–44)
AST: 21 U/L (ref 15–41)
Albumin: 4 g/dL (ref 3.5–5.0)
Alkaline Phosphatase: 53 U/L (ref 38–126)
Anion gap: 8 (ref 5–15)
BUN: 22 mg/dL (ref 8–23)
CO2: 27 mmol/L (ref 22–32)
Calcium: 8.1 mg/dL — ABNORMAL LOW (ref 8.9–10.3)
Chloride: 103 mmol/L (ref 98–111)
Creatinine, Ser: 0.68 mg/dL (ref 0.44–1.00)
GFR, Estimated: >60 mL/min (ref >60)
Glucose, Bld: 131 mg/dL — ABNORMAL HIGH (ref 70–99)
Potassium: 3.7 mmol/L (ref 3.5–5.1)
Sodium: 138 mmol/L (ref 135–145)
Total Bilirubin: 0.4 mg/dL (ref 0.3–1.2)
Total Protein: 6.9 g/dL (ref 6.5–8.1)

## 2022-05-08 LAB — CBC WITH DIFFERENTIAL/PLATELET
Abs Immature Granulocytes: 0.04 10*3/uL (ref 0.00–0.07)
Basophils Absolute: 0.1 10*3/uL (ref 0.0–0.1)
Basophils Relative: 2 %
Eosinophils Absolute: 0.1 10*3/uL (ref 0.0–0.5)
Eosinophils Relative: 2 %
HCT: 31.4 % — ABNORMAL LOW (ref 36.0–46.0)
Hemoglobin: 10.5 g/dL — ABNORMAL LOW (ref 12.0–15.0)
Immature Granulocytes: 1 %
Lymphocytes Relative: 38 %
Lymphs Abs: 1.7 10*3/uL (ref 0.7–4.0)
MCH: 33.5 pg (ref 26.0–34.0)
MCHC: 33.4 g/dL (ref 30.0–36.0)
MCV: 100.3 fL — ABNORMAL HIGH (ref 80.0–100.0)
Monocytes Absolute: 0.3 10*3/uL (ref 0.1–1.0)
Monocytes Relative: 6 %
Neutro Abs: 2.3 10*3/uL (ref 1.7–7.7)
Neutrophils Relative %: 51 %
Platelets: 312 10*3/uL (ref 150–400)
RBC: 3.13 MIL/uL — ABNORMAL LOW (ref 3.87–5.11)
RDW: 15.9 % — ABNORMAL HIGH (ref 11.5–15.5)
WBC: 4.5 10*3/uL (ref 4.0–10.5)
nRBC: 0.4 % — ABNORMAL HIGH (ref 0.0–0.2)

## 2022-05-08 LAB — TSH: TSH: 2.122 u[IU]/mL (ref 0.350–4.500)

## 2022-05-08 MED ORDER — SODIUM CHLORIDE 0.9 % IV SOLN
65.0000 mg/m2 | Freq: Once | INTRAVENOUS | Status: AC
  Administered 2022-05-08: 102 mg via INTRAVENOUS
  Filled 2022-05-08: qty 17

## 2022-05-08 MED ORDER — CYANOCOBALAMIN 1000 MCG/ML IJ SOLN
1000.0000 ug | Freq: Once | INTRAMUSCULAR | Status: AC
  Administered 2022-05-08: 1000 ug via INTRAMUSCULAR

## 2022-05-08 MED ORDER — FAMOTIDINE IN NACL 20-0.9 MG/50ML-% IV SOLN
20.0000 mg | Freq: Once | INTRAVENOUS | Status: AC
  Administered 2022-05-08: 20 mg via INTRAVENOUS

## 2022-05-08 MED ORDER — SODIUM CHLORIDE 0.9 % IV SOLN
Freq: Once | INTRAVENOUS | Status: AC
  Filled 2022-05-08: qty 250

## 2022-05-08 MED ORDER — DIPHENHYDRAMINE HCL 50 MG/ML IJ SOLN
50.0000 mg | Freq: Once | INTRAMUSCULAR | Status: AC
  Administered 2022-05-08: 50 mg via INTRAVENOUS

## 2022-05-08 MED ORDER — HEPARIN SOD (PORK) LOCK FLUSH 100 UNIT/ML IV SOLN
500.0000 [IU] | Freq: Once | INTRAVENOUS | Status: AC | PRN
  Administered 2022-05-08: 500 [IU]
  Filled 2022-05-08: qty 5

## 2022-05-08 MED ORDER — SODIUM CHLORIDE 0.9 % IV SOLN
109.8000 mg | Freq: Once | INTRAVENOUS | Status: AC
  Administered 2022-05-08: 110 mg via INTRAVENOUS
  Filled 2022-05-08: qty 11

## 2022-05-08 MED ORDER — PALONOSETRON HCL INJECTION 0.25 MG/5ML
0.2500 mg | Freq: Once | INTRAVENOUS | Status: AC
  Administered 2022-05-08: 0.25 mg via INTRAVENOUS

## 2022-05-08 MED ORDER — SODIUM CHLORIDE 0.9 % IV SOLN
10.0000 mg | Freq: Once | INTRAVENOUS | Status: AC
  Administered 2022-05-08: 10 mg via INTRAVENOUS
  Filled 2022-05-08: qty 10

## 2022-05-08 NOTE — Patient Instructions (Signed)
[facility] CANCER CTR AT [place]  Discharge Instructions: Thank you for choosing [facility] to provide your oncology and hematology care.  If you have a lab appointment with the [facility], please go directly to the [facility] and check in at the registration area.  Wear comfortable clothing and clothing appropriate for easy access to any Portacath or PICC line.   We strive to give you quality time with your provider. You may need to reschedule your appointment if you arrive late (15 or more minutes).  Arriving late affects you and other patients whose appointments are after yours.  Also, if you miss three or more appointments without notifying the office, you may be dismissed from the clinic at the provider's discretion.      For prescription refill requests, have your pharmacy contact our office and allow 72 hours for refills to be completed.    Today you received the following chemotherapy and/or immunotherapy agents TAXOL, CARBOPLATIN and VITAMIN b12      To help prevent nausea and vomiting after your treatment, we encourage you to take your nausea medication as directed.  BELOW ARE SYMPTOMS THAT SHOULD BE REPORTED IMMEDIATELY: *FEVER GREATER THAN 100.4 F (38 C) OR HIGHER *CHILLS OR SWEATING *NAUSEA AND VOMITING THAT IS NOT CONTROLLED WITH YOUR NAUSEA MEDICATION *UNUSUAL SHORTNESS OF BREATH *UNUSUAL BRUISING OR BLEEDING *URINARY PROBLEMS (pain or burning when urinating, or frequent urination) *BOWEL PROBLEMS (unusual diarrhea, constipation, pain near the anus) TENDERNESS IN MOUTH AND THROAT WITH OR WITHOUT PRESENCE OF ULCERS (sore throat, sores in mouth, or a toothache) UNUSUAL RASH, SWELLING OR PAIN  UNUSUAL VAGINAL DISCHARGE OR ITCHING   Items with * indicate a potential emergency and should be followed up as soon as possible or go to the Emergency Department if any problems should occur.  Please show the CHEMOTHERAPY ALERT CARD or IMMUNOTHERAPY  ALERT CARD at check-in to the Emergency Department and triage nurse.  Should you have questions after your visit or need to cancel or reschedule your appointment, please contact [facility] CANCER [place] AT [place]  [phone] and follow the prompts.  Office hours are 8:00 a.m. to 4:30 p.m. Monday - Friday. Please note that voicemails left after 4:00 p.m. may not be returned until the following business day.  We are closed weekends and major holidays. You have access to a nurse at all times for urgent questions. Please call the main number to the clinic [phone] and follow the prompts.  For any non-urgent questions, you may also contact your provider using MyChart. We now offer e-Visits for anyone 25 and older to request care online for non-urgent symptoms. For details visit [email or web address].   Also download the MyChart app! Go to the app store, search "MyChart", open the app, select [place], and log in with your MyChart username and password.  Due to Covid, a mask is required upon entering the hospital/clinic. If you do not have a mask, one will be given to you upon arrival. For doctor visits, patients may have 1 support person aged 89 or older with them. For treatment visits, patients cannot have anyone with them due to current Covid guidelines and our immunocompromised population.   Paclitaxel injection Qu es este medicamento? El PACLITAXEL es un agente quimioteraputico. Este medicamento acta sobre las clulas que se dividen rpidamente, como las clulas cancerosas, y finalmente provoca la muerte de estas clulas. Se utiliza en el tratamiento del cncer de ovario, mama, pulmn, sarcoma de [place] y otros tipos  de cncer. Este medicamento puede ser utilizado para otros usos; si tiene alguna pregunta consulte con su proveedor de atencin mdica o con su farmacutico. MARCAS COMUNES: Onxol, Taxol Qu le debo informar a mi profesional de la salud antes de tomar este  medicamento? Necesitan saber si usted presenta alguno de los siguientes problemas o situaciones: antecedentes de frecuencia cardiaca irregular enfermedad heptica recuentos sanguneos bajos, como baja cantidad de glbulos blancos, plaquetas o glbulos rojos enfermedad pulmonar o respiratoria, como asma hormigueo en las manos o los pies, u otro trastorno del sistema nervioso una reaccin alrgica o inusual al paclitaxel, al alcohol, al aceite de ricino polioxietilado, a otros medicamentos quimioteraputicos, a otros medicamentos, alimentos, colorantes o conservantes si est embarazada o buscando quedar embarazada si est amamantando a un beb Cmo debo [employer]? Este medicamento se administra como infusin en una vena. Un profesional de la salud especialmente capacitado lo administra en un hospital o clnica. Hable con su pediatra para informarse acerca del uso de este medicamento en nios. Puede requerir atencin especial. Sobredosis: Pngase en contacto inmediatamente con un centro toxicolgico o una sala de urgencia si usted cree que haya tomado demasiado medicamento. ATENCIN: [employer] es solo para usted. No comparta este medicamento con nadie. Qu sucede si me olvido de una dosis? Es importante no olvidar ninguna dosis. Informe a su mdico o a su profesional de la salud si no puede asistir a Photographer. Qu puede interactuar con este medicamento? No use este medicamento con ninguno de los siguientes frmacos: vacunas de virus vivos Este medicamento tambin puede interactuar con los siguientes frmacos: medicamentos antivirales para la hepatitis, VIH o SIDA ciertos antibiticos, tales como eritromicina y Ambulance person ciertos medicamentos para infecciones micticas, tales como itraconazol y ketoconazol ciertos medicamentos para convulsiones, tales como [place], fenobarbital y fenitona gemfibrozil nefazodona rifampicina hierba de [place] Puede ser que  esta lista no menciona todas las posibles interacciones. Informe a su profesional de KB Home	[place] de [employer] Corporation productos a base de hierbas, medicamentos de [place] o suplementos nutritivos que est tomando. Si usted fuma, consume bebidas alcohlicas o si utiliza drogas ilegales, indqueselo tambin a su profesional de KB Home	[place]. Algunas sustancias pueden interactuar con su medicamento. A qu debo estar atento al usar Coca-Cola? Se supervisar su estado de salud atentamente mientras reciba este medicamento. Tendr que hacerse anlisis de sangre importantes mientras est usando este medicamento. Este medicamento puede causar Chief of Staff graves. Para reducir su riesgo, necesitar tomar otro(s) medicamento(s) antes del tratamiento con este medicamento. Si tiene [employer] erupcin cutnea, comezn/picazn o urticaria, hinchazn del rostro, los labios, o la [place], informe de inmediato a su mdico o profesional de Technical sales engineer. En algunos casos, podra recibir [employer]ands para ayudarlo con los efectos secundarios. Siga todas las instrucciones para usarlos. Este medicamento podra hacerle sentir un Nurse, mental health. Esto es normal, ya que la quimioterapia puede afectar tanto a las clulas sanas como a las clulas cancerosas. Si presenta algn efecto secundario, infrmelo. Contine con el tratamiento aun si se siente enfermo, a menos que su mdico le indique que lo suspenda. Llame a su mdico o a su profesional de la salud si tiene fiebre, escalofros o dolor de garganta, o cualquier otro sntoma de resfro o gripe. No se trate usted mismo. Este medicamento reduce la capacidad del cuerpo para combatir infecciones. Trate de no acercarse a personas que estn enfermas. Este medicamento podra aumentar el riesgo de moretones o sangrado. Consulte a su mdico o  a su profesional de la salud si observa sangrados inusuales. Proceda con cuidado al cepillar sus dientes, usar hilo dental  o Risk manager palillos para los dientes, ya que podra contraer una infeccin o Therapist, art con mayor facilidad. Si se somete a algn tratamiento dental, informe a su dentista que est [employer]. Evite usar productos que contienen aspirina, acetaminofeno, ibuprofeno, naproxeno o ketoprofeno, a menos que as lo indique su mdico. Estos productos pueden ocultar la fiebre. No debe quedar embarazada mientras est [employer]. Las mujeres deben informar a su mdico si estn buscando quedar embarazadas o si creen que podran estar embarazadas. Existe la posibilidad de efectos secundarios graves en un beb sin nacer. Para obtener ms informacin, hable con su profesional de la salud o su farmacutico. No debe Economist a un beb mientras est usando este medicamento. Para los hombres, se desaconseja concebir hijos mientras reciben Coca-Cola. Este producto podra contener alcohol. Pregunte a Midwife o a su proveedor de atencin de la salud si este medicamento contiene alcohol. Asegrese de decirles a todos los proveedores de atencin de la salud que usted est tomando [place]. Ciertos medicamentos, como metronidazol y disulfiram, pueden causar una reaccin desagradable cuando se usan con alcohol. Esta reaccin incluye enrojecimiento, dolor de cabeza, nuseas, vmitos, sudoracin y aumento de la sed. La reaccin puede durar de 30 minutos a varias horas. Qu efectos secundarios puedo tener al [employer] este medicamento? Efectos secundarios que debe informar a su mdico o a Barrister's clerk de la salud tan pronto como sea posible: Chief of Staff, tales como erupcin cutnea, comezn/picazn o urticaria, e hinchazn de la cara, los labios o la lengua problemas para respirar cambios en la visin frecuencia cardiaca rpida e irregular presin sangunea alta o baja llagas en la boca dolor, hormigueo o entumecimiento de las manos o los pies signos de disminucin en la  cantidad de plaquetas o sangrado: moretones, puntos rojos en la piel, heces de color negro y aspecto alquitranado, sangre en la orina signos de disminucin en la cantidad de glbulos rojos: debilidad o cansancio inusuales, sensacin de Secondary school teacher o aturdimiento, cadas signos de infeccin: fiebre o escalofros, tos, dolor de garganta, dolor o dificultad para orinar signos y sntomas de lesin en el hgado, tales como orina amarilla oscura o [place]; sensacin general de estar enfermo o sntomas gripales; heces claras; prdida del apetito; nuseas; dolor en la regin abdominal superior derecha; debilidad o cansancio inusuales; color amarillento de los ojos o la piel hinchazn de tobillos, pies, manos frecuencia cardiaca inusualmente lenta Efectos secundarios que generalmente no requieren atencin mdica (infrmelos a su mdico o a su profesional de la salud si persisten o si son molestos): diarrea cada del cabello prdida del apetito dolores musculares o articulares nuseas, vmito dolor, enrojecimiento o Actor de la inyeccin cansancio Puede ser que esta lista no menciona todos los posibles efectos secundarios. Comunquese a su mdico por asesoramiento mdico [employer]. Usted puede informar los efectos secundarios a la FDA por telfono al 1-800-FDA-1088. Dnde debo guardar mi medicina? Este medicamento se administra en hospitales o clnicas, y no necesitar guardarlo en su domicilio. ATENCIN: Este folleto es un resumen. Puede ser que no cubra toda la posible informacin. Si usted tiene preguntas acerca de esta medicina, consulte con su mdico, su farmacutico o su profesional de Technical sales engineer.  2023 Elsevier/Gold Standard (2020-03-25 00:00:00)  Carboplatin injection Qu es este medicamento? El CARBOPLATINO es un agente quimioteraputico. Este medicamento acta sobre las clulas  que se dividen rpidamente, como las Engineer, civil (consulting), y finalmente provoca la  muerte de estas clulas. Se utiliza en el tratamiento del cncer de ovario y muchos otros tipos de Hotel manager. Este medicamento puede ser utilizado para otros usos; si tiene alguna pregunta consulte con su proveedor de atencin mdica o con su farmacutico. MARCAS COMUNES: Paraplatin Qu le debo informar a mi profesional de la salud antes de tomar este medicamento? Necesita saber si usted presenta alguno de los siguientes problemas o situaciones: trastornos sanguneos problemas auditivos enfermedad renal radioterapia reciente o continuada una reaccin alrgica o inusual al carboplatino, al cisplatino, a otros agentes quimioteraputicos, a otros medicamentos, alimentos, colorantes o conservantes si est embarazada o buscando quedar embarazada si est amamantando a un beb Cmo debo [employer]? Este medicamento se administra normalmente mediante infusin por va intravenosa. Lo administra un profesional de la salud calificado en un hospital o en un entorno clnico. Hable con su pediatra para informarse acerca del uso de este medicamento en nios. Puede requerir atencin especial. Sobredosis: Pngase en contacto inmediatamente con un centro toxicolgico o una sala de urgencia si usted cree que haya tomado demasiado medicamento. ATENCIN: [employer] es solo para usted. No comparta este medicamento con nadie. Qu sucede si me olvido de una dosis? Es importante no olvidar ninguna dosis. Informe a su mdico o a su profesional de la salud si no puede asistir a Photographer. Qu puede interactuar con este medicamento? medicamentos para convulsiones medicamentos para incrementar los conteos sanguneos, tales como filgrastim, pegfilgrastim, sargramostim ciertos antibiticos, tales como amicacina, gentamicina, neomicina, estreptomicina, tobramicina vacunas Consulte a su mdico o a su profesional de la salud antes de tomar cualquiera de los siguientes  medicamentos: acetaminofeno aspirina ibuprofeno quetoprofeno naproxeno Puede ser que esta lista no menciona todas las posibles interacciones. Informe a su profesional de KB Home	[place] de [employer] productos a base de hierbas, medicamentos de [name] o suplementos nutritivos que est tomando. Si usted fuma, consume bebidas alcohlicas o si utiliza drogas ilegales, indqueselo tambin a su profesional de KB Home	[place]. Algunas sustancias pueden interactuar con su medicamento. A qu debo estar atento al usar Coca-Cola? Se supervisar su estado de salud atentamente mientras reciba este medicamento. Tendr que hacerse anlisis de sangre peridicos mientras est tomando este medicamento. Este medicamento puede hacerle sentir un Nurse, mental health. Esto es normal ya que la quimioterapia afecta tanto a las clulas sanas como a las clulas cancerosas. Si presenta alguno de los [employer], infrmelos. Sin embargo, contine con el tratamiento aun si se siente enfermo, a menos que su mdico le indique que lo suspenda. En algunos casos, podr recibir Limited Brands para ayudarle con los efectos secundarios. Siga las instrucciones para usarlos. Consulte a su mdico o a su profesional de la salud por asesoramiento si tiene fiebre, escalofros, dolor de garganta o cualquier otro sntoma de resfro o gripe. No se trate usted mismo. Este medicamento puede reducir la capacidad del cuerpo para combatir infecciones. Trate de no acercarse a personas que estn enfermas. [employer] Foods puede aumentar el riesgo de magulladuras o sangrado. Consulte a su mdico o a su profesional de la salud si observa sangrados inusuales. Proceda con cuidado al cepillar sus dientes, usar hilo dental o Risk manager palillos para los dientes, ya que puede contraer una infeccin o Therapist, art con mayor facilidad. Si se somete a algn tratamiento dental, informe a su dentista que est [employer]. Evite tomar productos que  contienen aspirina, acetaminofeno, ibuprofeno, naproxeno o quetoprofeno a  menos que as lo indique su mdico. Estos productos pueden disimular la fiebre. No se debe quedar embarazada mientras recibe este medicamento. Las mujeres deben informar a su mdico si estn buscando quedar embarazadas o si creen que estn embarazadas. Existe la posibilidad de efectos secundarios graves a un beb sin nacer. Para ms informacin hable con su profesional de la salud o su farmacutico. No debe Economist a un beb mientras est usando este medicamento. Qu efectos secundarios puedo tener al [employer] este medicamento? Efectos secundarios que debe informar a su mdico o a Barrister's clerk de la salud tan pronto como sea posible: Chief of Staff como erupcin cutnea, picazn o urticarias, hinchazn de la cara, labios o lengua signos de infeccin - fiebre o escalofros, tos, dolor de garganta, dolor o dificultad para orinar signos de reduccin de plaquetas o sangrado - magulladuras, puntos rojos en la piel, heces de color oscuro o con aspecto alquitranado, sangrando por la nariz signos de reduccin de glbulos rojos - cansancio o debilidad inusual, desmayos, sensacin de [employer] problemas respiratorios cambios de audicin cambios en la visin dolor en el pecho alta presin sangunea conteos sanguneos bajos - Este medicamento puede reducir la cantidad de glbulos blancos, glbulos rojos y plaquetas. Su riesgo de infeccin y [place]. nuseas, vmito dolor, enrojecimiento, hinchazn o irritacin en el lugar de la inyeccin dolor, hormigueo, entumecimiento de manos o pies problemas de coordinacin, del habla, al caminar dificultad para orinar o cambios en el volumen de orina Efectos secundarios que, por lo general, no requieren atencin mdica (debe informarlos a su mdico o a su profesional de la salud si persisten o si son molestos): cada del cabello prdida del apetito sabor metlico o  cambios en el sentido del gusto Puede ser que esta lista no menciona todos los posibles efectos secundarios. Comunquese a su mdico por asesoramiento mdico [employer]. Usted puede informar los efectos secundarios a la FDA por telfono al 1-800-FDA-1088. Dnde debo guardar mi medicina? Este medicamento se administra en hospitales o clnicas y no necesitar guardarlo en su domicilio. ATENCIN: Este folleto es un resumen. Puede ser que no cubra toda la posible informacin. Si usted tiene preguntas acerca de esta medicina, consulte con su mdico, su farmacutico o su profesional de Technical sales engineer.  2023 Elsevier/Gold Standard (2015-01-12 00:00:00)  Vitamin B12 Injection Qu es este medicamento? La Vitamina B12 evita y trata los niveles bajos de vitamina B12 en el cuerpo. [place] en personas que no reciben suficiente vitamina B12 de su dieta o cuando el tracto digestivo no absorbe suficiente cantidad. La vitamina B12 cumple una funcin importante para [employer] salud del sistema nervioso y de los glbulos rojos. Este medicamento puede ser utilizado para otros usos; si tiene alguna pregunta consulte con su proveedor de atencin mdica o con su farmacutico. MARCAS COMUNES: B-12 Compliance Kit, B-12 Injection Kit, [place], [place], LA-12, Nutri-Twelve, Physicians EZ Use B-12, [place] Qu le debo informar a mi profesional de la salud antes de tomar este medicamento? Necesitan saber si usted presenta alguno de los siguientes problemas o situaciones: Enfermedad renal Enfermedad de [place] Anemia megaloblstica Una reaccin alrgica o inusual a la cianocobalamina, al cobalto, a otros medicamentos, alimentos, colorantes o conservantes Si est embarazada o buscando quedar embarazada Si est amamantando a un beb Cmo debo [employer]x? Este medicamento se inyecta en un msculo o bien profundo debajo de la piel. Por lo general se administra en una clnica o en el consultorio del  equipo de  atencin. Sin embargo, su equipo de atencin podra ensearle cmo inyectarse usted mismo. Siga todas las instrucciones. Hable con su equipo de atencin sobre el uso de este medicamento en nios. Puede requerir atencin especial. Sobredosis: Pngase en contacto inmediatamente con un centro toxicolgico o una sala de urgencia si usted cree que haya tomado demasiado medicamento. ATENCIN: [employer] es solo para usted. No comparta este medicamento con nadie. Qu sucede si me olvido de una dosis? Si le administran la dosis en la clnica o consultorio del equipo de atencin, pida que le reprogramen su cita. Si se administra usted mismo las inyecciones, adminstrela lo antes posible. Si es casi la hora de la prxima dosis, administre solo esa dosis. No se administre dosis adicionales o dobles. Qu puede interactuar con este medicamento? Alcohol Colchicina Puede ser que esta lista no menciona todas las posibles interacciones. Informe a su profesional de KB Home	[place] de [employer] productos a base de hierbas, medicamentos de [place] o suplementos nutritivos que est tomando. Si usted fuma, consume bebidas alcohlicas o si utiliza drogas ilegales, indqueselo tambin a su profesional de KB Home	[place]. Algunas sustancias pueden interactuar con su medicamento. A qu debo estar atento al usar Coca-Cola? Visite peridicamente a su equipo de atencin. Usted podra necesitar realizarse [employer] de sangre mientras est usando [place]. Es posible que deba seguir una dieta especial. Hable con su equipo de atencin. Limite su ingesta de alcohol y evite fumar para obtener el mayor beneficio. Qu efectos secundarios puedo tener al [employer] este medicamento? Efectos secundarios que debe informar a su equipo de atencin tan pronto como sea posible: Reacciones alrgicas: erupcin cutnea, comezn/picazn, urticaria, hinchazn de la cara, los labios, Barrister's clerk o la garganta Hinchazn de los  tobillos, las manos o los pies Problemas para respirar Efectos secundarios que generalmente no requieren atencin mdica (debe informarlos a su equipo de atencin si persisten o si son molestos): Diarrea Puede ser que esta lista no menciona todos los posibles efectos secundarios. Comunquese a su mdico por asesoramiento mdico [employer]. Usted puede informar los efectos secundarios a la FDA por telfono al 1-800-FDA-1088. Dnde debo guardar mi medicina? Mantenga fuera del alcance de los nios. Guarde a [employer], entre 15 y 55 grados Celsius (35 y 74 grados Fahrenheit). Proteja de Naval architect. Deseche todo el medicamento que no haya utilizado despus de la fecha de vencimiento. ATENCIN: Este folleto es un resumen. Puede ser que no cubra toda la posible informacin. Si usted tiene preguntas acerca de esta medicina, consulte con su mdico, su farmacutico o su profesional de Technical sales engineer.  2023 Elsevier/Gold Standard (2021-12-12 00:00:00)

## 2022-05-09 ENCOUNTER — Ambulatory Visit: Payer: Medicare HMO

## 2022-05-09 ENCOUNTER — Other Ambulatory Visit: Payer: Medicare HMO

## 2022-05-15 ENCOUNTER — Inpatient Hospital Stay: Payer: Medicare HMO

## 2022-05-15 ENCOUNTER — Encounter: Payer: Self-pay | Admitting: Oncology

## 2022-05-15 ENCOUNTER — Inpatient Hospital Stay (HOSPITAL_BASED_OUTPATIENT_CLINIC_OR_DEPARTMENT_OTHER): Payer: Medicare HMO | Admitting: Oncology

## 2022-05-15 ENCOUNTER — Ambulatory Visit: Payer: Medicare HMO

## 2022-05-15 VITALS — BP 139/53 | HR 81 | Temp 98.7°F | Resp 20 | Wt 124.0 lb

## 2022-05-15 DIAGNOSIS — C50311 Malignant neoplasm of lower-inner quadrant of right female breast: Secondary | ICD-10-CM | POA: Diagnosis not present

## 2022-05-15 DIAGNOSIS — D518 Other vitamin B12 deficiency anemias: Secondary | ICD-10-CM

## 2022-05-15 DIAGNOSIS — D6481 Anemia due to antineoplastic chemotherapy: Secondary | ICD-10-CM | POA: Diagnosis not present

## 2022-05-15 DIAGNOSIS — E538 Deficiency of other specified B group vitamins: Secondary | ICD-10-CM

## 2022-05-15 DIAGNOSIS — Z171 Estrogen receptor negative status [ER-]: Secondary | ICD-10-CM | POA: Diagnosis not present

## 2022-05-15 DIAGNOSIS — Z5111 Encounter for antineoplastic chemotherapy: Secondary | ICD-10-CM | POA: Diagnosis not present

## 2022-05-15 DIAGNOSIS — T451X5A Adverse effect of antineoplastic and immunosuppressive drugs, initial encounter: Secondary | ICD-10-CM

## 2022-05-15 LAB — COMPREHENSIVE METABOLIC PANEL
ALT: 22 U/L (ref 0–44)
AST: 21 U/L (ref 15–41)
Albumin: 3.7 g/dL (ref 3.5–5.0)
Alkaline Phosphatase: 52 U/L (ref 38–126)
Anion gap: 6 (ref 5–15)
BUN: 20 mg/dL (ref 8–23)
CO2: 29 mmol/L (ref 22–32)
Calcium: 8.8 mg/dL — ABNORMAL LOW (ref 8.9–10.3)
Chloride: 101 mmol/L (ref 98–111)
Creatinine, Ser: 0.66 mg/dL (ref 0.44–1.00)
GFR, Estimated: >60 mL/min (ref >60)
Glucose, Bld: 201 mg/dL — ABNORMAL HIGH (ref 70–99)
Potassium: 3.7 mmol/L (ref 3.5–5.1)
Sodium: 136 mmol/L (ref 135–145)
Total Bilirubin: 0.4 mg/dL (ref 0.3–1.2)
Total Protein: 6.5 g/dL (ref 6.5–8.1)

## 2022-05-15 LAB — CBC WITH DIFFERENTIAL/PLATELET
Abs Immature Granulocytes: 0.04 10*3/uL (ref 0.00–0.07)
Basophils Absolute: 0 10*3/uL (ref 0.0–0.1)
Basophils Relative: 1 %
Eosinophils Absolute: 0 10*3/uL (ref 0.0–0.5)
Eosinophils Relative: 1 %
HCT: 30.8 % — ABNORMAL LOW (ref 36.0–46.0)
Hemoglobin: 10.2 g/dL — ABNORMAL LOW (ref 12.0–15.0)
Immature Granulocytes: 1 %
Lymphocytes Relative: 31 %
Lymphs Abs: 1.4 10*3/uL (ref 0.7–4.0)
MCH: 33 pg (ref 26.0–34.0)
MCHC: 33.1 g/dL (ref 30.0–36.0)
MCV: 99.7 fL (ref 80.0–100.0)
Monocytes Absolute: 0.3 10*3/uL (ref 0.1–1.0)
Monocytes Relative: 7 %
Neutro Abs: 2.6 10*3/uL (ref 1.7–7.7)
Neutrophils Relative %: 59 %
Platelets: 305 10*3/uL (ref 150–400)
RBC: 3.09 MIL/uL — ABNORMAL LOW (ref 3.87–5.11)
RDW: 17.1 % — ABNORMAL HIGH (ref 11.5–15.5)
WBC: 4.4 10*3/uL (ref 4.0–10.5)
nRBC: 0 % (ref 0.0–0.2)

## 2022-05-15 LAB — TSH: TSH: 1.099 u[IU]/mL (ref 0.350–4.500)

## 2022-05-15 MED ORDER — CYANOCOBALAMIN 1000 MCG/ML IJ SOLN
1000.0000 ug | Freq: Once | INTRAMUSCULAR | Status: AC
  Administered 2022-05-15: 1000 ug via INTRAMUSCULAR
  Filled 2022-05-15: qty 1

## 2022-05-15 NOTE — Progress Notes (Signed)
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  It showed a suspicious mass at the 5:30 position of the right breast 4 cm from the nipple measuring 1.8 x 1.6 x 1.2 cm.  There may be a thickened echogenic rim which would bring the measurement to 2.9 x 1.9 x 2.3 cm.  Overlying skin thickening.  No axillary adenopathy.  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  Patient referred for further management.  History obtained with the help of Spanish interpreter.  Patient is independent of her ADLs at baseline.She reports some baseline diabetic neuropathy for which she is on gabapentin.   Family history significant for breast cancer in her sister  in her 71s.  She is here with her daughter today.  Reports her appetite and weight haveRemained stable.  She is anxious about her next steps with regards to breast cancer.   Patient is being treated with neoadjuvant intent with keynote 522 regimen starting with CarboTaxol Keytruda chemotherapy on 02/27/2022   Interval history-patient had some baseline neuropathy in her feet even prior to starting  chemotherapy which is mildly worse.  However she does report worsening neuropathy in her hands and intermittent pain because of neuropathy.  She is presently taking 600 mg of gabapentin at night.  She has also been on Cymbalta 60 mg daily for a year now.  Skin rash is overall better with topical steroids.  ECOG PS- 1 Pain scale- 2  Review of systems- Review of Systems  Constitutional:  Positive for malaise/fatigue. Negative for chills, fever and weight loss.  HENT:  Negative for congestion, ear discharge and nosebleeds.   Eyes:  Negative for blurred vision.  Respiratory:  Negative for cough, hemoptysis, sputum production, shortness of breath and wheezing.   Cardiovascular:  Negative for chest pain, palpitations, orthopnea and claudication.  Gastrointestinal:  Negative for abdominal pain, blood in stool, constipation, diarrhea, heartburn, melena, nausea and vomiting.  Genitourinary:  Negative for dysuria, flank pain, frequency, hematuria and urgency.  Musculoskeletal:  Negative for back pain, joint pain and myalgias.  Skin:  Positive for rash.  Neurological:  Positive for sensory change (peripheral neuropathy). Negative for dizziness, tingling, focal weakness, seizures, weakness and headaches.  Endo/Heme/Allergies:  Does not bruise/bleed easily.  Psychiatric/Behavioral:  Negative for depression and suicidal ideas. The patient does not have insomnia.        Allergies  Allergen Reactions   Contrast Media  [Iodinated Contrast Media] Nausea And Vomiting and Other (See Comments)    Pt was pre medicated 03/28/22 and 3 ML of contrast injected per [facility] A Cath check.  Pt reported no problems.      Past Medical History:  Diagnosis Date  Breast cancer, right breast ([place])    Diabetes mellitus without complication ([place])    Family history of prostate cancer    Gastritis    Heart murmur    History of pancreatitis    Hyperlipidemia    Hypertension    Palpitations      Past Surgical History:   Procedure Laterality Date   BREAST BIOPSY Right 11/2015   benign   BREAST BIOPSY Right 01/24/2022   INVASIVE MAMMARY CARCINOMA, NO SPECIAL TYPE   BREAST CYST EXCISION Right    CESAREAN SECTION     2x   CHOLECYSTECTOMY     IR CV LINE INJECTION  03/28/2022   PORTACATH PLACEMENT N/A 03/01/2022   Procedure: INSERTION PORT-A-CATH;  Surgeon: Herbert Pun, MD;  Location: [facility] ORS;  Service: General;  Laterality: N/A;    Social History   Socioeconomic History   Marital status: Married    Spouse name: Not on file   Number of children: Not on file   Years of education: Not on file   Highest education level: Not on file  Occupational History   Not on file  Tobacco Use   Smoking status: Never   Smokeless tobacco: Never  Vaping Use   Vaping Use: Never used  Substance and Sexual Activity   Alcohol use: Never   Drug use: Never   Sexual activity: Not on file  Other Topics Concern   Not on file  Social History Narrative   Not on file   Social Determinants of Health   Financial Resource Strain: Low Risk  (02/24/2022)   Overall Financial Resource Strain (CARDIA)    Difficulty of Paying Living Expenses: Not very hard  Food Insecurity: No Food Insecurity (02/24/2022)   Hunger Vital Sign    Worried About Running Out of Food in the Last Year: Never true    Ran Out of Food in the Last Year: Never true  Transportation Needs: No Transportation Needs (02/24/2022)   PRAPARE - Hydrologist (Medical): No    Lack of Transportation (Non-Medical): No  Physical Activity: Inactive (02/24/2022)   Exercise Vital Sign    Days of Exercise per Week: 0 days    Minutes of Exercise per Session: 0 min  Stress: No Stress Concern Present (02/24/2022)   [place]    Feeling of Stress : Only a little  Social Connections: Moderately Integrated (02/24/2022)   Social Connection and Isolation Panel [NHANES]     Frequency of Communication with Friends and Family: Three times a week    Frequency of Social Gatherings with Friends and Family: Three times a week    Attends Religious Services: 1 to 4 times per year    Active Member of Clubs or Organizations: No    Attends Archivist Meetings: Never    Marital Status: Married  Human resources officer Violence: Not At Risk (02/24/2022)   Humiliation, Afraid, Rape, and Kick questionnaire    Fear of Current or Ex-Partner: No    Emotionally Abused: No    Physically Abused: No    Sexually Abused: No    Family History  Problem Relation Age of Onset   Heart disease Father    Breast cancer Sister        35s   Prostate cancer Paternal Grandfather    Stomach cancer Cousin      Current Outpatient Medications:    atorvastatin (LIPITOR) 80 MG tablet, Take 80 mg by  mouth daily., Disp: , Rfl:    Desoximetasone 0.05 % OINT, Apply 1 Dose topically 3 (three) times daily as needed (Erythematous macular rash noted over bilateral knuckles)., Disp: 60 g, Rfl: 1   DULoxetine (CYMBALTA) 60 MG capsule, Take 60 mg by mouth daily., Disp: , Rfl:    enalapril (VASOTEC) 20 MG tablet, Take 20 mg by mouth daily., Disp: , Rfl:    fenofibrate (TRICOR) 145 MG tablet, Take 145 mg by mouth daily., Disp: , Rfl:    fluticasone (FLONASE) 50 MCG/ACT nasal spray, Place 2 sprays into both nostrils daily as needed for allergies or rhinitis., Disp: , Rfl:    folic acid (FOLVITE) 1 MG tablet, Take 1 tablet (1 mg total) by mouth daily., Disp: 30 tablet, Rfl: 3   gabapentin (NEURONTIN) 300 MG capsule, Take 300 mg by mouth in the morning and at bedtime., Disp: , Rfl:    glipiZIDE (GLUCOTROL XL) 2.5 MG 24 hr tablet, Take 2.5 mg by mouth daily., Disp: , Rfl:    hydrochlorothiazide (MICROZIDE) 12.5 MG capsule, Take 12.5 mg by mouth daily., Disp: , Rfl:    lidocaine-prilocaine (EMLA) cream, Apply to affected area once, Disp: 30 g, Rfl: 3   metoprolol succinate (TOPROL XL) 25 MG 24 hr tablet,  Take 1 tablet (25 mg total) by mouth daily., Disp: 90 tablet, Rfl: 3   predniSONE (DELTASONE) 10 MG tablet, Take by mouth., Disp: , Rfl:    aspirin 81 MG EC tablet, Take 1 tablet by mouth daily. (Patient not taking: Reported on 05/15/2022), Disp: , Rfl:    dexamethasone (DECADRON) 4 MG tablet, Take 2 tablets (8 mg total) by mouth daily. Start the day after chemotherapy for 2 days. (Patient not taking: Reported on 05/02/2022), Disp: 30 tablet, Rfl: 1   LORazepam (ATIVAN) 0.5 MG tablet, Take 1 tablet (0.5 mg total) by mouth every 6 (six) hours as needed (Nausea or vomiting). (Patient not taking: Reported on 03/06/2022), Disp: 30 tablet, Rfl: 0   meloxicam (MOBIC) 15 MG tablet, Take 15 mg by mouth daily as needed for pain. (Patient not taking: Reported on 03/20/2022), Disp: , Rfl:    ondansetron (ZOFRAN) 8 MG tablet, Take 1 tablet (8 mg total) by mouth 2 (two) times daily as needed for refractory nausea / vomiting. Start on day 3 after chemo. (Patient not taking: Reported on 03/20/2022), Disp: 30 tablet, Rfl: 1   prochlorperazine (COMPAZINE) 10 MG tablet, Take 1 tablet (10 mg total) by mouth every 6 (six) hours as needed (Nausea or vomiting). (Patient not taking: Reported on 03/06/2022), Disp: 30 tablet, Rfl: 1   tiZANidine (ZANAFLEX) 4 MG tablet, Take 4 mg by mouth every 8 (eight) hours as needed for muscle spasms. (Patient not taking: Reported on 03/20/2022), Disp: , Rfl:    traMADol (ULTRAM) 50 MG tablet, Take 1 tablet (50 mg total) by mouth every 6 (six) hours as needed. (Patient not taking: Reported on 03/20/2022), Disp: 10 tablet, Rfl: 0   vitamin C (ASCORBIC ACID) 500 MG tablet, Take 500 mg by mouth daily. (Patient not taking: Reported on 03/20/2022), Disp: , Rfl:   Physical exam:  Vitals:   05/15/22 1026  BP: (!) 139/53  Pulse: 81  Resp: 20  Temp: 98.7 F (37.1 C)  SpO2: 98%  Weight: 124 lb (56.2 kg)   Physical Exam Constitutional:      General: She is not in acute distress. Cardiovascular:      Rate and Rhythm: Normal rate and regular rhythm.  Heart sounds: Normal heart sounds.  Pulmonary:     Effort: Pulmonary effort is normal.     Breath sounds: Normal breath sounds.  Abdominal:     General: Bowel sounds are normal.     Palpations: Abdomen is soft.  Skin:    General: Skin is warm.     Comments: Erythematous areas over right upper extremity and b/l knuckles as well as heels appear improved  Neurological:     Mental Status: She is alert and oriented to person, place, and time.         Latest Ref Rng & Units 05/15/2022    9:53 AM  CMP  Glucose 70 - 99 mg/dL 201   BUN 8 - 23 mg/dL 20   Creatinine 0.44 - 1.00 mg/dL 0.66   Sodium 135 - 145 mmol/L 136   Potassium 3.5 - 5.1 mmol/L 3.7   Chloride 98 - 111 mmol/L 101   CO2 22 - 32 mmol/L 29   Calcium 8.9 - 10.3 mg/dL 8.8   Total Protein 6.5 - 8.1 g/dL 6.5   Total Bilirubin 0.3 - 1.2 mg/dL 0.4   Alkaline Phos 38 - 126 U/L 52   AST 15 - 41 U/L 21   ALT 0 - 44 U/L 22       Latest Ref Rng & Units 05/15/2022    9:53 AM  CBC  WBC 4.0 - 10.5 K/uL 4.4   Hemoglobin 12.0 - 15.0 g/dL 10.2   Hematocrit 36.0 - 46.0 % 30.8   Platelets 150 - 400 K/uL 305      Assessment and plan- Patient is a 66 y.o. female with triple negative breast cancer stage IIb and cT2 N0 M0.  She is here for on treatment assessment prior to cycle 12 of weekly CarboTaxol chemotherapy  Patient reports worsening pain especially in her bilateral fingers peripheral neuropathy.  I am therefore holding off on giving her cycle 12 of CarboTaxol chemotherapy today.  I have asked her to continue Cymbalta and increase gabapentin from 600 mg at night to 600 mg twice daily.  We will also refer her to acupuncture for her peripheral neuropathy.  Skin rash: Possibly secondary to Taxol.  Continue to monitor  Normocytic anemia: Secondary to chemotherapy.  Continue to monitor  I will see her back in 2 weeks for start of first cycle of [facility] Keytruda and she will need  echocardiogram prior.  B12 deficiency: B12 injection today and again in 2 weeks   Visit Diagnosis 1. Malignant neoplasm of lower-inner quadrant of right breast of female, estrogen receptor negative ([place])   2. Encounter for antineoplastic chemotherapy   3. B12 deficiency   4. Antineoplastic chemotherapy induced anemia      Dr. Randa Evens, MD, MPH [facility] at [facility] [phone] 05/15/2022 4:44 PM

## 2022-05-15 NOTE — Progress Notes (Signed)
Sluggish blood return with port access this AM. Labs collected peripherally. Dr. Janese Banks and team made aware.

## 2022-05-17 ENCOUNTER — Encounter: Payer: Self-pay | Admitting: *Deleted

## 2022-05-17 NOTE — Progress Notes (Signed)
Referral faxed to [facility] chiropract[facility] under [employer] [facility].  Called patient to let her know that the referral had been sent and she will hear from their office to set up the appointment, no answer, voicemail left with above.

## 2022-05-22 ENCOUNTER — Ambulatory Visit
Admission: RE | Admit: 2022-05-22 | Discharge: 2022-05-22 | Disposition: A | Discharge status: Home / Self Care | Payer: Medicare HMO | Source: Ambulatory Visit | Attending: Oncology | Admitting: Oncology

## 2022-05-22 DIAGNOSIS — I10 Essential (primary) hypertension: Secondary | ICD-10-CM | POA: Insufficient documentation

## 2022-05-22 DIAGNOSIS — C50311 Malignant neoplasm of lower-inner quadrant of right female breast: Secondary | ICD-10-CM | POA: Diagnosis not present

## 2022-05-22 DIAGNOSIS — Z171 Estrogen receptor negative status [ER-]: Secondary | ICD-10-CM

## 2022-05-22 DIAGNOSIS — Z0189 Encounter for other specified special examinations: Secondary | ICD-10-CM | POA: Diagnosis not present

## 2022-05-22 DIAGNOSIS — E119 Type 2 diabetes mellitus without complications: Secondary | ICD-10-CM | POA: Insufficient documentation

## 2022-05-22 DIAGNOSIS — E785 Hyperlipidemia, unspecified: Secondary | ICD-10-CM | POA: Diagnosis not present

## 2022-05-22 LAB — ECHOCARDIOGRAM COMPLETE
AR max vel: 3.1 cm2
AV Area VTI: 4.35 cm2
AV Area mean vel: 3.09 cm2
AV Mean grad: 4 mmHg
AV Peak grad: 8.2 mmHg
Ao pk vel: 1.43 m/s
Area-P 1/2: 2.61 cm2
MV VTI: 3.42 cm2
S' Lateral: 2.4 cm

## 2022-05-22 NOTE — Progress Notes (Signed)
*  PRELIMINARY RESULTS* Echocardiogram 2D Echocardiogram has been performed.  Sherrie Sport 05/22/2022, 9:26 AM

## 2022-05-23 ENCOUNTER — Ambulatory Visit
Admission: RE | Admit: 2022-05-23 | Discharge: 2022-05-23 | Disposition: A | Discharge status: Home / Self Care | Payer: Medicare HMO | Source: Ambulatory Visit | Attending: Oncology | Admitting: Oncology

## 2022-05-23 DIAGNOSIS — Z171 Estrogen receptor negative status [ER-]: Secondary | ICD-10-CM | POA: Insufficient documentation

## 2022-05-23 DIAGNOSIS — C50311 Malignant neoplasm of lower-inner quadrant of right female breast: Secondary | ICD-10-CM | POA: Diagnosis present

## 2022-05-26 MED FILL — Dexamethasone Sodium Phosphate Inj 100 MG/10ML: INTRAMUSCULAR | Qty: 1 | Status: AC

## 2022-05-26 MED FILL — Fosaprepitant Dimeglumine For IV Infusion 150 MG (Base Eq): INTRAVENOUS | Qty: 5 | Status: AC

## 2022-05-29 ENCOUNTER — Encounter: Payer: Self-pay | Admitting: Oncology

## 2022-05-29 ENCOUNTER — Inpatient Hospital Stay: Payer: Medicare HMO

## 2022-05-29 ENCOUNTER — Inpatient Hospital Stay (HOSPITAL_BASED_OUTPATIENT_CLINIC_OR_DEPARTMENT_OTHER): Payer: Medicare HMO | Admitting: Oncology

## 2022-05-29 VITALS — BP 157/57 | HR 76 | Temp 99.0°F | Resp 16 | Wt 125.6 lb

## 2022-05-29 DIAGNOSIS — Z171 Estrogen receptor negative status [ER-]: Secondary | ICD-10-CM

## 2022-05-29 DIAGNOSIS — C50311 Malignant neoplasm of lower-inner quadrant of right female breast: Secondary | ICD-10-CM | POA: Diagnosis not present

## 2022-05-29 DIAGNOSIS — Z5111 Encounter for antineoplastic chemotherapy: Secondary | ICD-10-CM | POA: Diagnosis not present

## 2022-05-29 DIAGNOSIS — Z5112 Encounter for antineoplastic immunotherapy: Secondary | ICD-10-CM

## 2022-05-29 DIAGNOSIS — D518 Other vitamin B12 deficiency anemias: Secondary | ICD-10-CM

## 2022-05-29 LAB — COMPREHENSIVE METABOLIC PANEL
ALT: 25 U/L (ref 0–44)
AST: 25 U/L (ref 15–41)
Albumin: 4 g/dL (ref 3.5–5.0)
Alkaline Phosphatase: 57 U/L (ref 38–126)
Anion gap: 9 (ref 5–15)
BUN: 21 mg/dL (ref 8–23)
CO2: 26 mmol/L (ref 22–32)
Calcium: 8.9 mg/dL (ref 8.9–10.3)
Chloride: 101 mmol/L (ref 98–111)
Creatinine, Ser: 0.75 mg/dL (ref 0.44–1.00)
GFR, Estimated: >60 mL/min (ref >60)
Glucose, Bld: 177 mg/dL — ABNORMAL HIGH (ref 70–99)
Potassium: 3.8 mmol/L (ref 3.5–5.1)
Sodium: 136 mmol/L (ref 135–145)
Total Bilirubin: 0.6 mg/dL (ref 0.3–1.2)
Total Protein: 6.8 g/dL (ref 6.5–8.1)

## 2022-05-29 LAB — CBC WITH DIFFERENTIAL/PLATELET
Abs Immature Granulocytes: 0.01 10*3/uL (ref 0.00–0.07)
Basophils Absolute: 0.1 10*3/uL (ref 0.0–0.1)
Basophils Relative: 1 %
Eosinophils Absolute: 0.1 10*3/uL (ref 0.0–0.5)
Eosinophils Relative: 2 %
HCT: 33.9 % — ABNORMAL LOW (ref 36.0–46.0)
Hemoglobin: 11.3 g/dL — ABNORMAL LOW (ref 12.0–15.0)
Immature Granulocytes: 0 %
Lymphocytes Relative: 27 %
Lymphs Abs: 1.5 10*3/uL (ref 0.7–4.0)
MCH: 33.2 pg (ref 26.0–34.0)
MCHC: 33.3 g/dL (ref 30.0–36.0)
MCV: 99.7 fL (ref 80.0–100.0)
Monocytes Absolute: 0.7 10*3/uL (ref 0.1–1.0)
Monocytes Relative: 12 %
Neutro Abs: 3.3 10*3/uL (ref 1.7–7.7)
Neutrophils Relative %: 58 %
Platelets: 208 10*3/uL (ref 150–400)
RBC: 3.4 MIL/uL — ABNORMAL LOW (ref 3.87–5.11)
RDW: 15.8 % — ABNORMAL HIGH (ref 11.5–15.5)
WBC: 5.6 10*3/uL (ref 4.0–10.5)
nRBC: 0 % (ref 0.0–0.2)

## 2022-05-29 MED ORDER — HEPARIN SOD (PORK) LOCK FLUSH 100 UNIT/ML IV SOLN
500.0000 [IU] | Freq: Once | INTRAVENOUS | Status: AC | PRN
  Administered 2022-05-29: 500 [IU]
  Filled 2022-05-29: qty 5

## 2022-05-29 MED ORDER — SODIUM CHLORIDE 0.9 % IV SOLN
600.0000 mg/m2 | Freq: Once | INTRAVENOUS | Status: AC
  Administered 2022-05-29: 940 mg via INTRAVENOUS
  Filled 2022-05-29: qty 47

## 2022-05-29 MED ORDER — CYANOCOBALAMIN 1000 MCG/ML IJ SOLN
1000.0000 ug | Freq: Once | INTRAMUSCULAR | Status: AC
  Administered 2022-05-29: 1000 ug via INTRAMUSCULAR
  Filled 2022-05-29: qty 1

## 2022-05-29 MED ORDER — SODIUM CHLORIDE 0.9 % IV SOLN
150.0000 mg | Freq: Once | INTRAVENOUS | Status: AC
  Administered 2022-05-29: 150 mg via INTRAVENOUS
  Filled 2022-05-29: qty 150

## 2022-05-29 MED ORDER — DOXORUBICIN HCL CHEMO IV INJECTION 2 MG/ML
60.0000 mg/m2 | Freq: Once | INTRAVENOUS | Status: AC
  Administered 2022-05-29: 94 mg via INTRAVENOUS
  Filled 2022-05-29: qty 47

## 2022-05-29 MED ORDER — SODIUM CHLORIDE 0.9 % IV SOLN
10.0000 mg | Freq: Once | INTRAVENOUS | Status: AC
  Administered 2022-05-29: 10 mg via INTRAVENOUS
  Filled 2022-05-29: qty 10

## 2022-05-29 MED ORDER — SODIUM CHLORIDE 0.9 % IV SOLN
Freq: Once | INTRAVENOUS | Status: AC
  Filled 2022-05-29: qty 250

## 2022-05-29 MED ORDER — PALONOSETRON HCL INJECTION 0.25 MG/5ML
0.2500 mg | Freq: Once | INTRAVENOUS | Status: AC
  Administered 2022-05-29: 0.25 mg via INTRAVENOUS
  Filled 2022-05-29: qty 5

## 2022-05-29 MED ORDER — SODIUM CHLORIDE 0.9 % IV SOLN
200.0000 mg | Freq: Once | INTRAVENOUS | Status: AC
  Administered 2022-05-29: 200 mg via INTRAVENOUS
  Filled 2022-05-29: qty 200

## 2022-05-31 ENCOUNTER — Inpatient Hospital Stay: Payer: Medicare HMO

## 2022-05-31 DIAGNOSIS — Z5111 Encounter for antineoplastic chemotherapy: Secondary | ICD-10-CM | POA: Diagnosis not present

## 2022-05-31 DIAGNOSIS — C50311 Malignant neoplasm of lower-inner quadrant of right female breast: Secondary | ICD-10-CM

## 2022-05-31 MED ORDER — PEGFILGRASTIM-CBQV 6 MG/0.6ML ~~LOC~~ SOSY
6.0000 mg | PREFILLED_SYRINGE | Freq: Once | SUBCUTANEOUS | Status: AC
  Administered 2022-05-31: 6 mg via SUBCUTANEOUS
  Filled 2022-05-31: qty 0.6

## 2022-06-19 ENCOUNTER — Inpatient Hospital Stay: Payer: Medicare HMO

## 2022-06-19 ENCOUNTER — Inpatient Hospital Stay (HOSPITAL_BASED_OUTPATIENT_CLINIC_OR_DEPARTMENT_OTHER): Payer: Medicare HMO | Admitting: Oncology

## 2022-06-19 ENCOUNTER — Encounter: Payer: Self-pay | Admitting: Oncology

## 2022-06-19 ENCOUNTER — Inpatient Hospital Stay: Payer: Medicare HMO | Attending: Oncology

## 2022-06-19 VITALS — BP 133/70 | HR 76 | Temp 98.5°F | Resp 16 | Wt 124.3 lb

## 2022-06-19 DIAGNOSIS — Z5111 Encounter for antineoplastic chemotherapy: Secondary | ICD-10-CM

## 2022-06-19 DIAGNOSIS — G620 Drug-induced polyneuropathy: Secondary | ICD-10-CM | POA: Diagnosis not present

## 2022-06-19 DIAGNOSIS — Z79899 Other long term (current) drug therapy: Secondary | ICD-10-CM | POA: Insufficient documentation

## 2022-06-19 DIAGNOSIS — C50311 Malignant neoplasm of lower-inner quadrant of right female breast: Secondary | ICD-10-CM

## 2022-06-19 DIAGNOSIS — Z5112 Encounter for antineoplastic immunotherapy: Secondary | ICD-10-CM | POA: Insufficient documentation

## 2022-06-19 DIAGNOSIS — R110 Nausea: Secondary | ICD-10-CM

## 2022-06-19 DIAGNOSIS — Z171 Estrogen receptor negative status [ER-]: Secondary | ICD-10-CM | POA: Insufficient documentation

## 2022-06-19 DIAGNOSIS — T451X5A Adverse effect of antineoplastic and immunosuppressive drugs, initial encounter: Secondary | ICD-10-CM

## 2022-06-19 LAB — COMPREHENSIVE METABOLIC PANEL
ALT: 24 U/L (ref 0–44)
AST: 25 U/L (ref 15–41)
Albumin: 4 g/dL (ref 3.5–5.0)
Alkaline Phosphatase: 58 U/L (ref 38–126)
Anion gap: 6 (ref 5–15)
BUN: 26 mg/dL — ABNORMAL HIGH (ref 8–23)
CO2: 26 mmol/L (ref 22–32)
Calcium: 9 mg/dL (ref 8.9–10.3)
Chloride: 103 mmol/L (ref 98–111)
Creatinine, Ser: 0.7 mg/dL (ref 0.44–1.00)
GFR, Estimated: >60 mL/min (ref >60)
Glucose, Bld: 233 mg/dL — ABNORMAL HIGH (ref 70–99)
Potassium: 3.9 mmol/L (ref 3.5–5.1)
Sodium: 135 mmol/L (ref 135–145)
Total Bilirubin: 0.5 mg/dL (ref 0.3–1.2)
Total Protein: 6.9 g/dL (ref 6.5–8.1)

## 2022-06-19 LAB — CBC WITH DIFFERENTIAL/PLATELET
Abs Immature Granulocytes: 0.04 10*3/uL (ref 0.00–0.07)
Basophils Absolute: 0.1 10*3/uL (ref 0.0–0.1)
Basophils Relative: 1 %
Eosinophils Absolute: 0 10*3/uL (ref 0.0–0.5)
Eosinophils Relative: 0 %
HCT: 34.7 % — ABNORMAL LOW (ref 36.0–46.0)
Hemoglobin: 11.5 g/dL — ABNORMAL LOW (ref 12.0–15.0)
Immature Granulocytes: 1 %
Lymphocytes Relative: 16 %
Lymphs Abs: 1.1 10*3/uL (ref 0.7–4.0)
MCH: 33.6 pg (ref 26.0–34.0)
MCHC: 33.1 g/dL (ref 30.0–36.0)
MCV: 101.5 fL — ABNORMAL HIGH (ref 80.0–100.0)
Monocytes Absolute: 1.1 10*3/uL — ABNORMAL HIGH (ref 0.1–1.0)
Monocytes Relative: 16 %
Neutro Abs: 4.5 10*3/uL (ref 1.7–7.7)
Neutrophils Relative %: 66 %
Platelets: 330 10*3/uL (ref 150–400)
RBC: 3.42 MIL/uL — ABNORMAL LOW (ref 3.87–5.11)
RDW: 15.8 % — ABNORMAL HIGH (ref 11.5–15.5)
WBC: 6.8 10*3/uL (ref 4.0–10.5)
nRBC: 0 % (ref 0.0–0.2)

## 2022-06-19 LAB — TSH: TSH: 2.658 u[IU]/mL (ref 0.350–4.500)

## 2022-06-19 MED ORDER — SODIUM CHLORIDE 0.9 % IV SOLN
10.0000 mg | Freq: Once | INTRAVENOUS | Status: AC
  Administered 2022-06-19: 10 mg via INTRAVENOUS
  Filled 2022-06-19: qty 1

## 2022-06-19 MED ORDER — SODIUM CHLORIDE 0.9 % IV SOLN
200.0000 mg | Freq: Once | INTRAVENOUS | Status: AC
  Administered 2022-06-19: 200 mg via INTRAVENOUS
  Filled 2022-06-19: qty 8

## 2022-06-19 MED ORDER — SODIUM CHLORIDE 0.9 % IV SOLN
150.0000 mg | Freq: Once | INTRAVENOUS | Status: AC
  Administered 2022-06-19: 150 mg via INTRAVENOUS
  Filled 2022-06-19: qty 5

## 2022-06-19 MED ORDER — PALONOSETRON HCL INJECTION 0.25 MG/5ML
0.2500 mg | Freq: Once | INTRAVENOUS | Status: AC
  Administered 2022-06-19: 0.25 mg via INTRAVENOUS
  Filled 2022-06-19: qty 5

## 2022-06-19 MED ORDER — SODIUM CHLORIDE 0.9 % IV SOLN
Freq: Once | INTRAVENOUS | Status: AC
  Filled 2022-06-19: qty 250

## 2022-06-19 MED ORDER — HEPARIN SOD (PORK) LOCK FLUSH 100 UNIT/ML IV SOLN
500.0000 [IU] | Freq: Once | INTRAVENOUS | Status: AC | PRN
  Administered 2022-06-19: 500 [IU]
  Filled 2022-06-19: qty 5

## 2022-06-19 MED ORDER — SODIUM CHLORIDE 0.9% FLUSH
10.0000 mL | INTRAVENOUS | Status: DC | PRN
  Administered 2022-06-19: 10 mL
  Filled 2022-06-19: qty 10

## 2022-06-19 MED ORDER — DOXORUBICIN HCL CHEMO IV INJECTION 2 MG/ML
60.0000 mg/m2 | Freq: Once | INTRAVENOUS | Status: AC
  Administered 2022-06-19: 94 mg via INTRAVENOUS
  Filled 2022-06-19: qty 47

## 2022-06-19 MED ORDER — SODIUM CHLORIDE 0.9 % IV SOLN
600.0000 mg/m2 | Freq: Once | INTRAVENOUS | Status: AC
  Administered 2022-06-19: 940 mg via INTRAVENOUS
  Filled 2022-06-19: qty 47

## 2022-06-19 NOTE — Progress Notes (Signed)
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  It showed a suspicious mass at the 5:30 position of the right breast 4 cm from the nipple measuring 1.8 x 1.6 x 1.2 cm.  There may be a thickened echogenic rim which would bring the measurement to 2.9 x 1.9 x 2.3 cm.  Overlying skin thickening.  No axillary adenopathy.  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  Patient referred for further management.  History obtained with the help of Spanish interpreter.  Patient is independent of her ADLs at baseline.She reports some baseline diabetic neuropathy for which she is on gabapentin.   Family history significant for breast cancer in her sister  in her 82s.  She is here with her daughter today.  Reports her appetite and weight haveRemained stable.  She is anxious about her next steps with regards to breast cancer.   Patient is being treated with neoadjuvant intent with keynote 522 regimen starting with CarboTaxol Keytruda chemotherapy on 02/27/2022    Interval history-tolerating chemotherapy well so far.  She did have some mild self-limited  nausea which was well controlled with nausea medications.  She has mild tingling numbness in her fingertips which comes and goes.  Denies other complaints at this time  ECOG PS- 1 Pain scale- 0   Review of systems- Review of Systems  Constitutional:  Negative for chills, fever, malaise/fatigue and weight loss.  HENT:  Negative for congestion, ear discharge and nosebleeds.   Eyes:  Negative for blurred vision.  Respiratory:  Negative for cough, hemoptysis, sputum production, shortness of breath and wheezing.   Cardiovascular:  Negative for chest pain, palpitations, orthopnea and claudication.  Gastrointestinal:  Positive for nausea. Negative for abdominal pain, blood in stool, constipation, diarrhea, heartburn, melena and vomiting.  Genitourinary:  Negative for dysuria, flank pain, frequency, hematuria and urgency.  Musculoskeletal:  Negative for back pain, joint pain and myalgias.  Skin:  Negative for rash.  Neurological:  Negative for dizziness, tingling, focal weakness, seizures, weakness and headaches.  Endo/Heme/Allergies:  Does not bruise/bleed easily.  Psychiatric/Behavioral:  Negative for depression and suicidal ideas. The patient does not have insomnia.       Allergies  Allergen Reactions   Contrast Media  [Iodinated Contrast Media] Nausea And Vomiting and Other (See Comments)    Pt was pre medicated 03/28/22 and 3 ML of contrast injected per [facility] A Cath check.  Pt reported no problems.      Past Medical History:  Diagnosis Date   Breast cancer, right breast ([place])    Diabetes mellitus without complication ([place])    Family history of prostate cancer    Gastritis    Heart murmur  History of pancreatitis    Hyperlipidemia    Hypertension    Palpitations      Past Surgical History:  Procedure Laterality Date   BREAST BIOPSY Right 11/2015   benign   BREAST BIOPSY Right 01/24/2022   INVASIVE MAMMARY CARCINOMA, NO SPECIAL TYPE   BREAST CYST EXCISION Right     CESAREAN SECTION     2x   CHOLECYSTECTOMY     IR CV LINE INJECTION  03/28/2022   PORTACATH PLACEMENT N/A 03/01/2022   Procedure: INSERTION PORT-A-CATH;  Surgeon: Herbert Pun, MD;  Location: [facility] ORS;  Service: General;  Laterality: N/A;    Social History   Socioeconomic History   Marital status: Married    Spouse name: Not on file   Number of children: Not on file   Years of education: Not on file   Highest education level: Not on file  Occupational History   Not on file  Tobacco Use   Smoking status: Never   Smokeless tobacco: Never  Vaping Use   Vaping Use: Never used  Substance and Sexual Activity   Alcohol use: Never   Drug use: Never   Sexual activity: Not on file  Other Topics Concern   Not on file  Social History Narrative   Not on file   Social Determinants of Health   Financial Resource Strain: Low Risk  (02/24/2022)   Overall Financial Resource Strain (CARDIA)    Difficulty of Paying Living Expenses: Not very hard  Food Insecurity: No Food Insecurity (02/24/2022)   Hunger Vital Sign    Worried About Running Out of Food in the Last Year: Never true    Ran Out of Food in the Last Year: Never true  Transportation Needs: No Transportation Needs (02/24/2022)   PRAPARE - Hydrologist (Medical): No    Lack of Transportation (Non-Medical): No  Physical Activity: Inactive (02/24/2022)   Exercise Vital Sign    Days of Exercise per Week: 0 days    Minutes of Exercise per Session: 0 min  Stress: No Stress Concern Present (02/24/2022)   [place]    Feeling of Stress : Only a little  Social Connections: Moderately Integrated (02/24/2022)   Social Connection and Isolation Panel [NHANES]    Frequency of Communication with Friends and Family: Three times a week    Frequency of Social Gatherings with Friends and Family: Three times a week    Attends Religious Services: 1  to 4 times per year    Active Member of Clubs or Organizations: No    Attends Archivist Meetings: Never    Marital Status: Married  Human resources officer Violence: Not At Risk (02/24/2022)   Humiliation, Afraid, Rape, and Kick questionnaire    Fear of Current or Ex-Partner: No    Emotionally Abused: No    Physically Abused: No    Sexually Abused: No    Family History  Problem Relation Age of Onset   Heart disease Father    Breast cancer Sister        38s   Prostate cancer Paternal Grandfather    Stomach cancer Cousin      Current Outpatient Medications:    atorvastatin (LIPITOR) 80 MG tablet, Take 80 mg by mouth daily., Disp: , Rfl:    Desoximetasone 0.05 % OINT, Apply 1 Dose topically 3 (three) times daily as needed (Erythematous macular rash noted over bilateral knuckles)., Disp: 60 g, Rfl:  1   dexamethasone (DECADRON) 4 MG tablet, Take 2 tablets (8 mg total) by mouth daily. Start the day after chemotherapy for 2 days., Disp: 30 tablet, Rfl: 1   DULoxetine (CYMBALTA) 60 MG capsule, Take 60 mg by mouth daily., Disp: , Rfl:    enalapril (VASOTEC) 20 MG tablet, Take 20 mg by mouth daily., Disp: , Rfl:    fenofibrate (TRICOR) 145 MG tablet, Take 145 mg by mouth daily., Disp: , Rfl:    fluticasone (FLONASE) 50 MCG/ACT nasal spray, Place 2 sprays into both nostrils daily as needed for allergies or rhinitis., Disp: , Rfl:    folic acid (FOLVITE) 1 MG tablet, Take 1 tablet (1 mg total) by mouth daily., Disp: 30 tablet, Rfl: 3   gabapentin (NEURONTIN) 300 MG capsule, Take 300 mg by mouth in the morning and at bedtime., Disp: , Rfl:    glipiZIDE (GLUCOTROL XL) 2.5 MG 24 hr tablet, Take 2.5 mg by mouth daily., Disp: , Rfl:    hydrochlorothiazide (MICROZIDE) 12.5 MG capsule, Take 12.5 mg by mouth daily., Disp: , Rfl:    lidocaine-prilocaine (EMLA) cream, Apply to affected area once, Disp: 30 g, Rfl: 3   LORazepam (ATIVAN) 0.5 MG tablet, Take 1 tablet (0.5 mg total) by mouth every 6  (six) hours as needed (Nausea or vomiting)., Disp: 30 tablet, Rfl: 0   meloxicam (MOBIC) 15 MG tablet, Take 15 mg by mouth daily as needed for pain., Disp: , Rfl:    metoprolol succinate (TOPROL XL) 25 MG 24 hr tablet, Take 1 tablet (25 mg total) by mouth daily., Disp: 90 tablet, Rfl: 3   ondansetron (ZOFRAN) 8 MG tablet, Take 1 tablet (8 mg total) by mouth 2 (two) times daily as needed for refractory nausea / vomiting. Start on day 3 after chemo., Disp: 30 tablet, Rfl: 1   predniSONE (DELTASONE) 10 MG tablet, Take by mouth., Disp: , Rfl:    tiZANidine (ZANAFLEX) 4 MG tablet, Take 4 mg by mouth every 8 (eight) hours as needed for muscle spasms., Disp: , Rfl:    vitamin C (ASCORBIC ACID) 500 MG tablet, Take 500 mg by mouth daily., Disp: , Rfl:    aspirin 81 MG EC tablet, Take 1 tablet by mouth daily. (Patient not taking: Reported on 05/15/2022), Disp: , Rfl:    prochlorperazine (COMPAZINE) 10 MG tablet, Take 1 tablet (10 mg total) by mouth every 6 (six) hours as needed (Nausea or vomiting). (Patient not taking: Reported on 03/06/2022), Disp: 30 tablet, Rfl: 1   traMADol (ULTRAM) 50 MG tablet, Take 1 tablet (50 mg total) by mouth every 6 (six) hours as needed. (Patient not taking: Reported on 03/20/2022), Disp: 10 tablet, Rfl: 0  Physical exam:  Vitals:   06/19/22 0832  Resp: 16  Temp: 98.5 F (36.9 C)  Weight: 124 lb 4.8 oz (56.4 kg)   Physical Exam Constitutional:      General: She is not in acute distress. Cardiovascular:     Rate and Rhythm: Normal rate and regular rhythm.     Heart sounds: Normal heart sounds.  Pulmonary:     Effort: Pulmonary effort is normal.  Skin:    General: Skin is warm and dry.     Comments: Exfoliating skin rash over the posterior aspect of the right foot appears to be improving.  Skin rash over dorsal surface of bilateral hands has resolved  Neurological:     Mental Status: She is alert and oriented to person, place, and time.  Latest Ref Rng &  Units 05/29/2022    7:58 AM  CMP  Glucose 70 - 99 mg/dL 177   BUN 8 - 23 mg/dL 21   Creatinine 0.44 - 1.00 mg/dL 0.75   Sodium 135 - 145 mmol/L 136   Potassium 3.5 - 5.1 mmol/L 3.8   Chloride 98 - 111 mmol/L 101   CO2 22 - 32 mmol/L 26   Calcium 8.9 - 10.3 mg/dL 8.9   Total Protein 6.5 - 8.1 g/dL 6.8   Total Bilirubin 0.3 - 1.2 mg/dL 0.6   Alkaline Phos 38 - 126 U/L 57   AST 15 - 41 U/L 25   ALT 0 - 44 U/L 25       Latest Ref Rng & Units 06/19/2022    8:13 AM  CBC  WBC 4.0 - 10.5 K/uL 6.8   Hemoglobin 12.0 - 15.0 g/dL 11.5   Hematocrit 36.0 - 46.0 % 34.7   Platelets 150 - 400 K/uL 330     No images are attached to the encounter.  US Breast Limited Uni Right Inc Axilla  Result Date: 05/23/2022 CLINICAL DATA:  66 year old with a biopsy-proven grade 3 invasive mammary carcinoma of no special type originally diagnosed in February, 2023. Follow-up ultrasound is performed to evaluate the treatment response to neoadjuvant chemotherapy. EXAM: ULTRASOUND OF THE RIGHT BREAST. COMPARISON:  01/06/2022. FINDINGS: Targeted ultrasound is performed, showing a good response to neoadjuvant chemotherapy. There is a residual hypoechoic mass at the 5:30 o'clock position 4 cm from the nipple within which the ribbon shaped tissue marker clip is visible, currently measuring approximately 1.5 x 0.6 x 1.1 cm (previously 1.8 x 1.2 x 1.3 cm), demonstrating posterior acoustic shadowing and demonstrating internal power Doppler flow. Sonographic evaluation of the RIGHT axilla again demonstrates no pathologic lymphadenopathy. IMPRESSION: 1. Good response to neoadjuvant chemotherapy with reduction in size of the mass in the LOWER INNER QUADRANT of the RIGHT breast at 5:30 o'clock 4 cm from nipple. Measurements are given above. (Please note that MRI is more sensitive in evaluating the response to neoadjuvant chemotherapy). 2. No pathologic RIGHT axillary lymphadenopathy. RECOMMENDATION: 1. Treatment plan for the  biopsy-proven malignancy. 2. Annual BILATERAL diagnostic mammography which will be due in February, 2024. I have discussed the findings and recommendations with the patient. If applicable, a reminder letter will be sent to the patient regarding the next appointment. BI-RADS CATEGORY  6: Known biopsy-proven malignancy. Electronically Signed   By: Evangeline Dakin M.D.   On: 05/23/2022 15:16  ECHOCARDIOGRAM COMPLETE  Result Date: 05/22/2022    ECHOCARDIOGRAM REPORT   Patient Name:   Susan [name] Date of Exam: 05/22/2022 Medical Rec #:  [id number]        Height:       60.0 in Accession #:    [id number]       Weight:       124.0 lb Date of Birth:  1956/06/06        BSA:          1.523 m Patient Age:    20 years         BP:           139/53 mmHg Patient Gender: F                HR:           81 bpm. Exam Location:  [facility] Procedure: 2D Echo, Strain Analysis, Color Doppler and Cardiac Doppler Indications:  Chemo Z09  History:         Patient has prior history of Echocardiogram examinations, most                  recent 02/16/2022. Signs/Symptoms:Murmur; Risk Factors:Diabetes,                  Hypertension and Dyslipidemia.  Sonographer:     Sherrie Sport Referring Phys:  [id number] [facility] C Nikalas Bramel Diagnosing Phys: Kathlyn Sacramento MD  Sonographer Comments: Global longitudinal strain was attempted. IMPRESSIONS  1. Left ventricular ejection fraction, by estimation, is 60 to 65%. The left ventricle has normal function. The left ventricle has no regional wall motion abnormalities. There is mild left ventricular hypertrophy. Left ventricular diastolic parameters are consistent with Grade I diastolic dysfunction (impaired relaxation). The average left ventricular global longitudinal strain is -19.8 %. The global longitudinal strain is normal.  2. Right ventricular systolic function is normal. The right ventricular size is normal. Tricuspid regurgitation signal is inadequate for assessing PA pressure.  3. The mitral valve is normal  in structure. Trivial mitral valve regurgitation. No evidence of mitral stenosis.  4. The aortic valve is normal in structure. Aortic valve regurgitation is not visualized. No aortic stenosis is present.  5. The inferior vena cava is normal in size with greater than 50% respiratory variability, suggesting right atrial pressure of 3 mmHg. FINDINGS  Left Ventricle: Left ventricular ejection fraction, by estimation, is 60 to 65%. The left ventricle has normal function. The left ventricle has no regional wall motion abnormalities. The average left ventricular global longitudinal strain is -19.8 %. The global longitudinal strain is normal. The left ventricular internal cavity size was normal in size. There is mild left ventricular hypertrophy. Left ventricular diastolic parameters are consistent with Grade I diastolic dysfunction (impaired relaxation). Right Ventricle: The right ventricular size is normal. No increase in right ventricular wall thickness. Right ventricular systolic function is normal. Tricuspid regurgitation signal is inadequate for assessing PA pressure. Left Atrium: Left atrial size was normal in size. Right Atrium: Right atrial size was normal in size. Pericardium: There is no evidence of pericardial effusion. Mitral Valve: The mitral valve is normal in structure. Trivial mitral valve regurgitation. No evidence of mitral valve stenosis. MV peak gradient, 7.0 mmHg. The mean mitral valve gradient is 2.0 mmHg. Tricuspid Valve: The tricuspid valve is normal in structure. Tricuspid valve regurgitation is trivial. No evidence of tricuspid stenosis. Aortic Valve: The aortic valve is normal in structure. Aortic valve regurgitation is not visualized. No aortic stenosis is present. Aortic valve mean gradient measures 4.0 mmHg. Aortic valve peak gradient measures 8.2 mmHg. Aortic valve area, by VTI measures 4.35 cm. Pulmonic Valve: The pulmonic valve was normal in structure. Pulmonic valve regurgitation is not  visualized. No evidence of pulmonic stenosis. Aorta: The aortic root is normal in size and structure. Venous: The inferior vena cava is normal in size with greater than 50% respiratory variability, suggesting right atrial pressure of 3 mmHg. IAS/Shunts: No atrial level shunt detected by color flow Doppler.  LEFT VENTRICLE PLAX 2D LVIDd:         3.60 cm   Diastology LVIDs:         2.40 cm   LV e' medial:    4.68 cm/s LV PW:         1.05 cm   LV E/e' medial:  13.1 LV IVS:        1.25 cm   LV e' lateral:  5.44 cm/s LVOT diam:     2.00 cm   LV E/e' lateral: 11.3 LV SV:         98 LV SV Index:   65        2D Longitudinal Strain LVOT Area:     3.14 cm  2D Strain GLS Avg:     -19.8 %                           3D Volume EF:                          3D EF:        46 %                          LV EDV:       125 ml                          LV ESV:       68 ml                          LV SV:        58 ml RIGHT VENTRICLE RV S prime:     15.00 cm/s TAPSE (M-mode): 2.6 cm LEFT ATRIUM             Index        RIGHT ATRIUM           Index LA diam:        4.00 cm 2.63 cm/m   RA Area:     10.50 cm LA Vol (A2C):   37.7 ml 24.75 ml/m  RA Volume:   23.50 ml  15.43 ml/m LA Vol (A4C):   31.6 ml 20.74 ml/m LA Biplane Vol: 35.9 ml 23.57 ml/m  AORTIC VALVE AV Area (Vmax):    3.10 cm AV Area (Vmean):   3.09 cm AV Area (VTI):     4.35 cm AV Vmax:           143.00 cm/s AV Vmean:          89.500 cm/s AV VTI:            0.226 m AV Peak Grad:      8.2 mmHg AV Mean Grad:      4.0 mmHg LVOT Vmax:         141.00 cm/s LVOT Vmean:        87.900 cm/s LVOT VTI:          0.313 m LVOT/AV VTI ratio: 1.38  AORTA Ao Root diam: 2.73 cm MITRAL VALVE                TRICUSPID VALVE MV Area (PHT): 2.61 cm     TR Peak grad:   12.7 mmHg MV Area VTI:   3.42 cm     TR Vmax:        178.00 cm/s MV Peak grad:  7.0 mmHg MV Mean grad:  2.0 mmHg     SHUNTS MV Vmax:       1.32 m/s     Systemic VTI:  0.31 m MV Vmean:      65.9 cm/s    Systemic Diam: 2.00 cm MV  Decel Time: 291 msec MV E velocity: 61.30 cm/s MV A velocity:  107.00 cm/s MV E/A ratio:  0.57 Kathlyn Sacramento MD Electronically signed by Kathlyn Sacramento MD Signature Date/Time: 05/22/2022/3:54:48 PM    Final      Assessment and plan- Patient is a 66 y.o. female with triple negative breast cancer stage IIb and cT2 N0 M0.  She is here for on treatment assessment prior to cycle 2 of neoadjuvant AC Keytruda chemotherapy  Interim ultrasound after 12 cycles of CarboTaxol chemotherapy along with Beryle Flock shows interim response to treatment with reduction in the size ofPrimary breast mass.  No abnormal appearing right axillary lymph nodes.  Plan is to complete neoadjuvant chemotherapy with [facility] Keytruda followed by definitive surgery.  Counts are otherwise okay to proceed with cycle 2 of neoadjuvant AC Keytruda chemotherapy today with Udenyca on day 3.  She will get cycle 3 in 3 weeks and I will see her back in 6 weeks for cycle 4.  She will be seen by covering NP in 3 weeks.  Chemo-induced nausea: Continue as needed nausea medications  Chemo-induced peripheral neuropathy: Currently mild.  Patient is undergoing acupuncture and states that has been helping her   Visit Diagnosis 1. Encounter for antineoplastic chemotherapy   2. Malignant neoplasm of lower-inner quadrant of right breast of female, estrogen receptor negative ([place])   3. Chemotherapy-induced nausea   4. Chemotherapy-induced peripheral neuropathy ([place])      Dr. Randa Evens, MD, MPH [facility] at [facility] [phone] 06/19/2022 8:34 AM

## 2022-06-19 NOTE — Patient Instructions (Signed)
[facility] CANCER CTR AT [place]  Discharge Instructions: Thank you for choosing [facility] to provide your oncology and hematology care.  If you have a lab appointment with the [facility], please go directly to the [facility] and check in at the registration area.  Wear comfortable clothing and clothing appropriate for easy access to any Portacath or PICC line.   We strive to give you quality time with your provider. You may need to reschedule your appointment if you arrive late (15 or more minutes).  Arriving late affects you and other patients whose appointments are after yours.  Also, if you miss three or more appointments without notifying the office, you may be dismissed from the clinic at the provider's discretion.      For prescription refill requests, have your pharmacy contact our office and allow 72 hours for refills to be completed.    Today you received the following chemotherapy and/or immunotherapy agents: Keytruda, Adriamycin, Cytoxin      To help prevent nausea and vomiting after your treatment, we encourage you to take your nausea medication as directed.  BELOW ARE SYMPTOMS THAT SHOULD BE REPORTED IMMEDIATELY: *FEVER GREATER THAN 100.4 F (38 C) OR HIGHER *CHILLS OR SWEATING *NAUSEA AND VOMITING THAT IS NOT CONTROLLED WITH YOUR NAUSEA MEDICATION *UNUSUAL SHORTNESS OF BREATH *UNUSUAL BRUISING OR BLEEDING *URINARY PROBLEMS (pain or burning when urinating, or frequent urination) *BOWEL PROBLEMS (unusual diarrhea, constipation, pain near the anus) TENDERNESS IN MOUTH AND THROAT WITH OR WITHOUT PRESENCE OF ULCERS (sore throat, sores in mouth, or a toothache) UNUSUAL RASH, SWELLING OR PAIN  UNUSUAL VAGINAL DISCHARGE OR ITCHING   Items with * indicate a potential emergency and should be followed up as soon as possible or go to the Emergency Department if any problems should occur.  Please show the CHEMOTHERAPY ALERT CARD or IMMUNOTHERAPY ALERT  CARD at check-in to the Emergency Department and triage nurse.  Should you have questions after your visit or need to cancel or reschedule your appointment, please contact [facility] CANCER [place] AT [place]  [phone] and follow the prompts.  Office hours are 8:00 a.m. to 4:30 p.m. Monday - Friday. Please note that voicemails left after 4:00 p.m. may not be returned until the following business day.  We are closed weekends and major holidays. You have access to a nurse at all times for urgent questions. Please call the main number to the clinic [phone] and follow the prompts.  For any non-urgent questions, you may also contact your provider using MyChart. We now offer e-Visits for anyone 67 and older to request care online for non-urgent symptoms. For details visit [email or web address].   Also download the MyChart app! Go to the app store, search "MyChart", open the app, select [facility], and log in with your MyChart username and password.  Masks are optional in the cancer centers. If you would like for your care team to wear a mask while they are taking care of you, please let them know. For doctor visits, patients may have with them one support person who is at least 68 years old. At this time, visitors are not allowed in the infusion area.

## 2022-06-19 NOTE — Progress Notes (Signed)
Pt states that she experiences tingling in three of her fingers and both of her hands, but not in feet.

## 2022-06-21 ENCOUNTER — Inpatient Hospital Stay: Payer: Medicare HMO

## 2022-06-21 DIAGNOSIS — Z5112 Encounter for antineoplastic immunotherapy: Secondary | ICD-10-CM | POA: Diagnosis not present

## 2022-06-21 DIAGNOSIS — C50311 Malignant neoplasm of lower-inner quadrant of right female breast: Secondary | ICD-10-CM

## 2022-06-21 MED ORDER — PEGFILGRASTIM-CBQV 6 MG/0.6ML ~~LOC~~ SOSY
6.0000 mg | PREFILLED_SYRINGE | Freq: Once | SUBCUTANEOUS | Status: AC
  Administered 2022-06-21: 6 mg via SUBCUTANEOUS
  Filled 2022-06-21: qty 0.6

## 2022-06-26 ENCOUNTER — Other Ambulatory Visit: Payer: Self-pay

## 2022-07-03 ENCOUNTER — Telehealth: Payer: Self-pay | Admitting: *Deleted

## 2022-07-03 ENCOUNTER — Encounter: Payer: Self-pay | Admitting: Oncology

## 2022-07-03 NOTE — Telephone Encounter (Signed)
Call received from [facility] requesting fax number for labs drawn to be received and reviewed by cancer center team. Call returned, fax number given. Message sent to medical records and clinic team regarding results being faxed over for review.

## 2022-07-07 MED FILL — Fosaprepitant Dimeglumine For IV Infusion 150 MG (Base Eq): INTRAVENOUS | Qty: 5 | Status: AC

## 2022-07-07 MED FILL — Dexamethasone Sodium Phosphate Inj 100 MG/10ML: INTRAMUSCULAR | Qty: 1 | Status: AC

## 2022-07-10 ENCOUNTER — Encounter: Payer: Self-pay | Admitting: Nurse Practitioner

## 2022-07-10 ENCOUNTER — Inpatient Hospital Stay: Payer: Medicare HMO

## 2022-07-10 ENCOUNTER — Inpatient Hospital Stay (HOSPITAL_BASED_OUTPATIENT_CLINIC_OR_DEPARTMENT_OTHER): Payer: Medicare HMO | Admitting: Nurse Practitioner

## 2022-07-10 ENCOUNTER — Inpatient Hospital Stay: Payer: Medicare HMO | Attending: Oncology

## 2022-07-10 VITALS — BP 126/75 | HR 79 | Temp 99.1°F | Resp 16 | Wt 124.9 lb

## 2022-07-10 DIAGNOSIS — Z5111 Encounter for antineoplastic chemotherapy: Secondary | ICD-10-CM

## 2022-07-10 DIAGNOSIS — C50311 Malignant neoplasm of lower-inner quadrant of right female breast: Secondary | ICD-10-CM | POA: Diagnosis not present

## 2022-07-10 DIAGNOSIS — R110 Nausea: Secondary | ICD-10-CM | POA: Insufficient documentation

## 2022-07-10 DIAGNOSIS — Z171 Estrogen receptor negative status [ER-]: Secondary | ICD-10-CM | POA: Diagnosis not present

## 2022-07-10 DIAGNOSIS — Z5112 Encounter for antineoplastic immunotherapy: Secondary | ICD-10-CM

## 2022-07-10 DIAGNOSIS — Z79899 Other long term (current) drug therapy: Secondary | ICD-10-CM | POA: Diagnosis not present

## 2022-07-10 DIAGNOSIS — D6481 Anemia due to antineoplastic chemotherapy: Secondary | ICD-10-CM | POA: Diagnosis not present

## 2022-07-10 DIAGNOSIS — H04203 Unspecified epiphora, bilateral lacrimal glands: Secondary | ICD-10-CM

## 2022-07-10 LAB — CBC WITH DIFFERENTIAL/PLATELET
Abs Immature Granulocytes: 0.14 10*3/uL — ABNORMAL HIGH (ref 0.00–0.07)
Basophils Absolute: 0.1 10*3/uL (ref 0.0–0.1)
Basophils Relative: 1 %
Eosinophils Absolute: 0 10*3/uL (ref 0.0–0.5)
Eosinophils Relative: 0 %
HCT: 30.1 % — ABNORMAL LOW (ref 36.0–46.0)
Hemoglobin: 10.2 g/dL — ABNORMAL LOW (ref 12.0–15.0)
Immature Granulocytes: 1 %
Lymphocytes Relative: 8 %
Lymphs Abs: 0.9 10*3/uL (ref 0.7–4.0)
MCH: 34.5 pg — ABNORMAL HIGH (ref 26.0–34.0)
MCHC: 33.9 g/dL (ref 30.0–36.0)
MCV: 101.7 fL — ABNORMAL HIGH (ref 80.0–100.0)
Monocytes Absolute: 1.2 10*3/uL — ABNORMAL HIGH (ref 0.1–1.0)
Monocytes Relative: 11 %
Neutro Abs: 8.3 10*3/uL — ABNORMAL HIGH (ref 1.7–7.7)
Neutrophils Relative %: 79 %
Platelets: 310 10*3/uL (ref 150–400)
RBC: 2.96 MIL/uL — ABNORMAL LOW (ref 3.87–5.11)
RDW: 15.8 % — ABNORMAL HIGH (ref 11.5–15.5)
WBC: 10.6 10*3/uL — ABNORMAL HIGH (ref 4.0–10.5)
nRBC: 0 % (ref 0.0–0.2)

## 2022-07-10 LAB — COMPREHENSIVE METABOLIC PANEL
ALT: 23 U/L (ref 0–44)
AST: 25 U/L (ref 15–41)
Albumin: 3.6 g/dL (ref 3.5–5.0)
Alkaline Phosphatase: 78 U/L (ref 38–126)
Anion gap: 12 (ref 5–15)
BUN: 22 mg/dL (ref 8–23)
CO2: 23 mmol/L (ref 22–32)
Calcium: 9 mg/dL (ref 8.9–10.3)
Chloride: 102 mmol/L (ref 98–111)
Creatinine, Ser: 0.63 mg/dL (ref 0.44–1.00)
GFR, Estimated: >60 mL/min (ref >60)
Glucose, Bld: 186 mg/dL — ABNORMAL HIGH (ref 70–99)
Potassium: 4 mmol/L (ref 3.5–5.1)
Sodium: 137 mmol/L (ref 135–145)
Total Bilirubin: 0.3 mg/dL (ref 0.3–1.2)
Total Protein: 6.3 g/dL — ABNORMAL LOW (ref 6.5–8.1)

## 2022-07-10 LAB — TSH: TSH: 1.601 u[IU]/mL (ref 0.350–4.500)

## 2022-07-10 MED ORDER — SODIUM CHLORIDE 0.9 % IV SOLN
600.0000 mg/m2 | Freq: Once | INTRAVENOUS | Status: AC
  Administered 2022-07-10: 940 mg via INTRAVENOUS
  Filled 2022-07-10: qty 47

## 2022-07-10 MED ORDER — SODIUM CHLORIDE 0.9 % IV SOLN
Freq: Once | INTRAVENOUS | Status: AC
  Filled 2022-07-10: qty 250

## 2022-07-10 MED ORDER — HEPARIN SOD (PORK) LOCK FLUSH 100 UNIT/ML IV SOLN
500.0000 [IU] | Freq: Once | INTRAVENOUS | Status: AC | PRN
  Administered 2022-07-10: 500 [IU]
  Filled 2022-07-10: qty 5

## 2022-07-10 MED ORDER — SODIUM CHLORIDE 0.9 % IV SOLN
200.0000 mg | Freq: Once | INTRAVENOUS | Status: AC
  Administered 2022-07-10: 200 mg via INTRAVENOUS
  Filled 2022-07-10: qty 8

## 2022-07-10 MED ORDER — SODIUM CHLORIDE 0.9 % IV SOLN
10.0000 mg | Freq: Once | INTRAVENOUS | Status: AC
  Administered 2022-07-10: 10 mg via INTRAVENOUS
  Filled 2022-07-10: qty 10

## 2022-07-10 MED ORDER — DOXORUBICIN HCL CHEMO IV INJECTION 2 MG/ML
60.0000 mg/m2 | Freq: Once | INTRAVENOUS | Status: AC
  Administered 2022-07-10: 94 mg via INTRAVENOUS
  Filled 2022-07-10: qty 47

## 2022-07-10 MED ORDER — PALONOSETRON HCL INJECTION 0.25 MG/5ML
0.2500 mg | Freq: Once | INTRAVENOUS | Status: AC
  Administered 2022-07-10: 0.25 mg via INTRAVENOUS
  Filled 2022-07-10: qty 5

## 2022-07-10 MED ORDER — SODIUM CHLORIDE 0.9 % IV SOLN
150.0000 mg | Freq: Once | INTRAVENOUS | Status: AC
  Administered 2022-07-10: 150 mg via INTRAVENOUS
  Filled 2022-07-10: qty 150

## 2022-07-10 NOTE — Progress Notes (Signed)
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  It showed a suspicious mass at the 5:30 position of the right breast 4 cm from the nipple measuring 1.8 x 1.6 x 1.2 cm.  There may be a thickened echogenic rim which would bring the measurement to 2.9 x 1.9 x 2.3 cm.  Overlying skin thickening.  No axillary adenopathy.  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  Patient referred for further management.  History obtained with the help of Spanish interpreter.  Patient is independent of her ADLs at baseline.She reports some baseline diabetic neuropathy for which she is on gabapentin.   Family history significant for breast cancer in her sister  in her 21s.  She is here with her daughter today.  Reports her appetite and weight haveRemained stable.  She is anxious about her next steps with regards to breast cancer.   Patient is being treated with neoadjuvant intent with keynote 522 regimen starting with CarboTaxol Keytruda chemotherapy on 02/27/2022    Interval history-patient is 66 year old female who returns to clinic for repeat evaluation and  consideration of cycle 3 of AC Keytruda chemotherapy.  Continues to tolerate chemotherapy well.  Has some mild self-limiting nausea that is well-controlled by prescription nausea medications.  Denies numbness and tingling in her fingers or toes.  Exfoliative rash of hands and feet has resolved.  Complains of watering eyes.  Denies other complaints at this time.  ECOG PS- 1 Pain scale- 0   Review of systems- Review of Systems  Constitutional:  Negative for chills, fever, malaise/fatigue and weight loss.  HENT:  Positive for ear discharge. Negative for hearing loss, nosebleeds, sore throat and tinnitus.   Eyes:  Negative for blurred vision and double vision.  Respiratory:  Negative for cough, hemoptysis, shortness of breath and wheezing.   Cardiovascular:  Negative for chest pain, palpitations and leg swelling.  Gastrointestinal:  Negative for abdominal pain, blood in stool, constipation, diarrhea, melena, nausea and vomiting.  Genitourinary:  Negative for dysuria and urgency.  Musculoskeletal:  Negative for back pain, falls, joint pain and myalgias.  Skin:  Negative for itching and rash.  Neurological:  Negative for dizziness, tingling, sensory change, loss of consciousness, weakness and headaches.  Endo/Heme/Allergies:  Negative for environmental allergies. Does not bruise/bleed easily.  Psychiatric/Behavioral:  Negative for depression. The patient is not nervous/anxious and does not have insomnia.       Allergies  Allergen Reactions   Contrast Media  [Iodinated Contrast Media] Nausea And Vomiting and Other (See Comments)    Pt was pre medicated 03/28/22 and 3 ML of contrast injected per [facility] A Cath check.  Pt reported no problems.  Past Medical History:  Diagnosis Date   Breast cancer, right breast ([place])    Diabetes mellitus without complication (HCC)    Family history of prostate cancer    Gastritis    Heart murmur    History of pancreatitis    Hyperlipidemia     Hypertension    Palpitations      Past Surgical History:  Procedure Laterality Date   BREAST BIOPSY Right 11/2015   benign   BREAST BIOPSY Right 01/24/2022   INVASIVE MAMMARY CARCINOMA, NO SPECIAL TYPE   BREAST CYST EXCISION Right    CESAREAN SECTION     2x   CHOLECYSTECTOMY     IR CV LINE INJECTION  03/28/2022   PORTACATH PLACEMENT N/A 03/01/2022   Procedure: INSERTION PORT-A-CATH;  Surgeon: Herbert Pun, MD;  Location: [facility] ORS;  Service: General;  Laterality: N/A;    Social History   Socioeconomic History   Marital status: Married    Spouse name: Not on file   Number of children: Not on file   Years of education: Not on file   Highest education level: Not on file  Occupational History   Not on file  Tobacco Use   Smoking status: Never   Smokeless tobacco: Never  Vaping Use   Vaping Use: Never used  Substance and Sexual Activity   Alcohol use: Never   Drug use: Never   Sexual activity: Not on file  Other Topics Concern   Not on file  Social History Narrative   Not on file   Social Determinants of Health   Financial Resource Strain: Low Risk  (02/24/2022)   Overall Financial Resource Strain (CARDIA)    Difficulty of Paying Living Expenses: Not very hard  Food Insecurity: No Food Insecurity (02/24/2022)   Hunger Vital Sign    Worried About Running Out of Food in the Last Year: Never true    Ran Out of Food in the Last Year: Never true  Transportation Needs: No Transportation Needs (02/24/2022)   PRAPARE - Hydrologist (Medical): No    Lack of Transportation (Non-Medical): No  Physical Activity: Inactive (02/24/2022)   Exercise Vital Sign    Days of Exercise per Week: 0 days    Minutes of Exercise per Session: 0 min  Stress: No Stress Concern Present (02/24/2022)   [place]    Feeling of Stress : Only a little  Social Connections: Moderately Integrated  (02/24/2022)   Social Connection and Isolation Panel [NHANES]    Frequency of Communication with Friends and Family: Three times a week    Frequency of Social Gatherings with Friends and Family: Three times a week    Attends Religious Services: 1 to 4 times per year    Active Member of Clubs or Organizations: No    Attends Archivist Meetings: Never    Marital Status: Married  Human resources officer Violence: Not At Risk (02/24/2022)   Humiliation, Afraid, Rape, and Kick questionnaire    Fear of Current or Ex-Partner: No    Emotionally Abused: No    Physically Abused: No    Sexually Abused: No    Family History  Problem Relation Age of Onset   Heart disease Father    Breast cancer Sister        54s   Prostate cancer Paternal Grandfather    Stomach cancer Cousin      Current Outpatient Medications:    atorvastatin (  LIPITOR) 80 MG tablet, Take 80 mg by mouth daily., Disp: , Rfl:    Desoximetasone 0.05 % OINT, Apply 1 Dose topically 3 (three) times daily as needed (Erythematous macular rash noted over bilateral knuckles)., Disp: 60 g, Rfl: 1   dexamethasone (DECADRON) 4 MG tablet, Take 2 tablets (8 mg total) by mouth daily. Start the day after chemotherapy for 2 days., Disp: 30 tablet, Rfl: 1   DULoxetine (CYMBALTA) 60 MG capsule, Take 60 mg by mouth daily., Disp: , Rfl:    enalapril (VASOTEC) 20 MG tablet, Take 20 mg by mouth daily., Disp: , Rfl:    fenofibrate (TRICOR) 145 MG tablet, Take 145 mg by mouth daily., Disp: , Rfl:    fluticasone (FLONASE) 50 MCG/ACT nasal spray, Place 2 sprays into both nostrils daily as needed for allergies or rhinitis., Disp: , Rfl:    folic acid (FOLVITE) 1 MG tablet, Take 1 tablet (1 mg total) by mouth daily., Disp: 30 tablet, Rfl: 3   gabapentin (NEURONTIN) 300 MG capsule, Take 300 mg by mouth in the morning and at bedtime., Disp: , Rfl:    glipiZIDE (GLUCOTROL XL) 2.5 MG 24 hr tablet, Take 2.5 mg by mouth daily., Disp: , Rfl:     hydrochlorothiazide (MICROZIDE) 12.5 MG capsule, Take 12.5 mg by mouth daily., Disp: , Rfl:    lidocaine-prilocaine (EMLA) cream, Apply to affected area once, Disp: 30 g, Rfl: 3   LORazepam (ATIVAN) 0.5 MG tablet, Take 1 tablet (0.5 mg total) by mouth every 6 (six) hours as needed (Nausea or vomiting)., Disp: 30 tablet, Rfl: 0   Melatonin 5 MG CAPS, Take by mouth., Disp: , Rfl:    meloxicam (MOBIC) 15 MG tablet, Take 15 mg by mouth daily as needed for pain., Disp: , Rfl:    metoprolol succinate (TOPROL XL) 25 MG 24 hr tablet, Take 1 tablet (25 mg total) by mouth daily., Disp: 90 tablet, Rfl: 3   ondansetron (ZOFRAN) 8 MG tablet, Take 1 tablet (8 mg total) by mouth 2 (two) times daily as needed for refractory nausea / vomiting. Start on day 3 after chemo., Disp: 30 tablet, Rfl: 1   predniSONE (DELTASONE) 10 MG tablet, Take by mouth., Disp: , Rfl:    prochlorperazine (COMPAZINE) 10 MG tablet, Take 1 tablet (10 mg total) by mouth every 6 (six) hours as needed (Nausea or vomiting)., Disp: 30 tablet, Rfl: 1   tiZANidine (ZANAFLEX) 4 MG tablet, Take 4 mg by mouth every 8 (eight) hours as needed for muscle spasms., Disp: , Rfl:    aspirin 81 MG EC tablet, Take 1 tablet by mouth daily. (Patient not taking: Reported on 05/15/2022), Disp: , Rfl:    traMADol (ULTRAM) 50 MG tablet, Take 1 tablet (50 mg total) by mouth every 6 (six) hours as needed. (Patient not taking: Reported on 03/20/2022), Disp: 10 tablet, Rfl: 0   vitamin C (ASCORBIC ACID) 500 MG tablet, Take 500 mg by mouth daily., Disp: , Rfl:   Physical exam:  Vitals:   07/10/22 0830  BP: 126/75  Pulse: 79  Resp: 16  Temp: 99.1 F (37.3 C)  Weight: 124 lb 14.4 oz (56.7 kg)   Physical Exam Constitutional:      General: She is not in acute distress.    Appearance: She is well-developed. She is not ill-appearing.  HENT:     Head: Atraumatic.     Nose: Nose normal.     Mouth/Throat:     Pharynx: No oropharyngeal exudate.  Eyes:     General:  No scleral icterus.    Conjunctiva/sclera: Conjunctivae normal.  Cardiovascular:     Rate and Rhythm: Normal rate and regular rhythm.  Pulmonary:     Effort: Pulmonary effort is normal.     Breath sounds: Normal breath sounds.  Abdominal:     General: Bowel sounds are normal. There is no distension.     Palpations: Abdomen is soft.     Tenderness: There is no abdominal tenderness.  Musculoskeletal:        General: No tenderness or deformity. Normal range of motion.     Cervical back: Normal range of motion and neck supple.  Skin:    General: Skin is warm and dry.     Coloration: Skin is not pale.  Neurological:     General: No focal deficit present.     Mental Status: She is alert and oriented to person, place, and time.  Psychiatric:        Mood and Affect: Mood normal.        Behavior: Behavior normal.         Latest Ref Rng & Units 07/10/2022    8:01 AM  CMP  Glucose 70 - 99 mg/dL 186   BUN 8 - 23 mg/dL 22   Creatinine 0.44 - 1.00 mg/dL 0.63   Sodium 135 - 145 mmol/L 137   Potassium 3.5 - 5.1 mmol/L 4.0   Chloride 98 - 111 mmol/L 102   CO2 22 - 32 mmol/L 23   Calcium 8.9 - 10.3 mg/dL 9.0   Total Protein 6.5 - 8.1 g/dL 6.3   Total Bilirubin 0.3 - 1.2 mg/dL 0.3   Alkaline Phos 38 - 126 U/L 78   AST 15 - 41 U/L 25   ALT 0 - 44 U/L 23       Latest Ref Rng & Units 07/10/2022    8:01 AM  CBC  WBC 4.0 - 10.5 K/uL 10.6   Hemoglobin 12.0 - 15.0 g/dL 10.2   Hematocrit 36.0 - 46.0 % 30.1   Platelets 150 - 400 K/uL 310     No images are attached to the encounter.  No results found.   Assessment and plan- Patient is a 66 y.o. female with triple negative breast cancer stage IIb and cT2 N0 M0.  She is here for on treatment assessment prior to cycle 3 of neoadjuvant AC Keytruda chemotherapy.   Interim ultrasound after 12 cycles of Carbo-Taxol chemotherapy along with Beryle Flock shows interim response to treatment with reduction in the size of primary breast mass.  No abnormal  appearing right axillary lymph nodes.  Plan is to complete neoadjuvant chemotherapy with [facility] Keytruda followed by definitive surgery. Counts are otherwise okay to proceed with cycle 3 of neoadjuvant AC Keytruda chemotherapy today with Udenyca on day 3.  She will follow up with Dr. Janese Banks for cycle 4.   Chemo-induced nausea: Continue as needed nausea medications  Chemo-induced peripheral neuropathy: Currently mild.  Patient is undergoing acupuncture and states that has been helping her  Skin changes associated with chemotherapy- continue use of urea cream as needed.   Watery eyes- possible allergies. Trial otc zatador eye drops.   Disposition: Chemo today. Follow up as scheduled- la  Visit Diagnosis No diagnosis found.  Beckey Rutter, DNP, AGNP-C [place] at [facility] [phone] (clinic) 07/10/2022

## 2022-07-10 NOTE — Patient Instructions (Signed)
[facility]  Discharge Instructions: Thank you for choosing [facility] to provide your oncology and hematology care.  If you have a lab appointment with the Cancer Center, please go directly to the Cancer Center and check in at the registration area.  Wear comfortable clothing and clothing appropriate for easy access to any Portacath or PICC line.   We strive to give you quality time with your provider. You may need to reschedule your appointment if you arrive late (15 or more minutes).  Arriving late affects you and other patients whose appointments are after yours.  Also, if you miss three or more appointments without notifying the office, you may be dismissed from the clinic at the provider's discretion.      For prescription refill requests, have your pharmacy contact our office and allow 72 hours for refills to be completed.    Today you received the following chemotherapy and/or immunotherapy agents Keytruda, Adriamycin, Cytoxan      To help prevent nausea and vomiting after your treatment, we encourage you to take your nausea medication as directed.  BELOW ARE SYMPTOMS THAT SHOULD BE REPORTED IMMEDIATELY: *FEVER GREATER THAN 100.4 F (38 C) OR HIGHER *CHILLS OR SWEATING *NAUSEA AND VOMITING THAT IS NOT CONTROLLED WITH YOUR NAUSEA MEDICATION *UNUSUAL SHORTNESS OF BREATH *UNUSUAL BRUISING OR BLEEDING *URINARY PROBLEMS (pain or burning when urinating, or frequent urination) *BOWEL PROBLEMS (unusual diarrhea, constipation, pain near the anus) TENDERNESS IN MOUTH AND THROAT WITH OR WITHOUT PRESENCE OF ULCERS (sore throat, sores in mouth, or a toothache) UNUSUAL RASH, SWELLING OR PAIN  UNUSUAL VAGINAL DISCHARGE OR ITCHING   Items with * indicate a potential emergency and should be followed up as soon as possible or go to the Emergency Department if any problems should occur.  Please show the CHEMOTHERAPY ALERT CARD or IMMUNOTHERAPY ALERT  CARD at check-in to the Emergency Department and triage nurse.  Should you have questions after your visit or need to cancel or reschedule your appointment, please contact [facility]  [phone] and follow the prompts.  Office hours are 8:00 a.m. to 4:30 p.m. Monday - Friday. Please note that voicemails left after 4:00 p.m. may not be returned until the following business day.  We are closed weekends and major holidays. You have access to a nurse at all times for urgent questions. Please call the main number to the clinic [phone] and follow the prompts.  For any non-urgent questions, you may also contact your provider using MyChart. We now offer e-Visits for anyone 18 and older to request care online for non-urgent symptoms. For details visit [email or web address].   Also download the MyChart app! Go to the app store, search "MyChart", open the app, select [facility], and log in with your MyChart username and password.  Masks are optional in the cancer centers. If you would like for your care team to wear a mask while they are taking care of you, please let them know. For doctor visits, patients may have with them one support person who is at least 68 years old. At this time, visitors are not allowed in the infusion area.   

## 2022-07-10 NOTE — Patient Instructions (Signed)
Zatador - allergy eye drop

## 2022-07-12 ENCOUNTER — Inpatient Hospital Stay: Payer: Medicare HMO

## 2022-07-12 DIAGNOSIS — Z5112 Encounter for antineoplastic immunotherapy: Secondary | ICD-10-CM | POA: Diagnosis not present

## 2022-07-12 DIAGNOSIS — Z171 Estrogen receptor negative status [ER-]: Secondary | ICD-10-CM

## 2022-07-12 MED ORDER — PEGFILGRASTIM-CBQV 6 MG/0.6ML ~~LOC~~ SOSY
6.0000 mg | PREFILLED_SYRINGE | Freq: Once | SUBCUTANEOUS | Status: AC
  Administered 2022-07-12: 6 mg via SUBCUTANEOUS
  Filled 2022-07-12: qty 0.6

## 2022-07-28 ENCOUNTER — Other Ambulatory Visit: Payer: Self-pay

## 2022-07-28 MED FILL — Dexamethasone Sodium Phosphate Inj 100 MG/10ML: INTRAMUSCULAR | Qty: 1 | Status: AC

## 2022-07-28 MED FILL — Fosaprepitant Dimeglumine For IV Infusion 150 MG (Base Eq): INTRAVENOUS | Qty: 5 | Status: AC

## 2022-07-31 ENCOUNTER — Inpatient Hospital Stay: Payer: Medicare HMO

## 2022-07-31 ENCOUNTER — Inpatient Hospital Stay (HOSPITAL_BASED_OUTPATIENT_CLINIC_OR_DEPARTMENT_OTHER): Payer: Medicare HMO | Admitting: Oncology

## 2022-07-31 ENCOUNTER — Encounter: Payer: Self-pay | Admitting: Oncology

## 2022-07-31 VITALS — BP 115/53 | HR 77 | Temp 98.7°F | Resp 16 | Wt 121.8 lb

## 2022-07-31 DIAGNOSIS — T451X5A Adverse effect of antineoplastic and immunosuppressive drugs, initial encounter: Secondary | ICD-10-CM | POA: Diagnosis not present

## 2022-07-31 DIAGNOSIS — C50311 Malignant neoplasm of lower-inner quadrant of right female breast: Secondary | ICD-10-CM | POA: Diagnosis not present

## 2022-07-31 DIAGNOSIS — D6481 Anemia due to antineoplastic chemotherapy: Secondary | ICD-10-CM

## 2022-07-31 DIAGNOSIS — Z5112 Encounter for antineoplastic immunotherapy: Secondary | ICD-10-CM | POA: Diagnosis not present

## 2022-07-31 DIAGNOSIS — Z171 Estrogen receptor negative status [ER-]: Secondary | ICD-10-CM

## 2022-07-31 DIAGNOSIS — Z5111 Encounter for antineoplastic chemotherapy: Secondary | ICD-10-CM

## 2022-07-31 LAB — COMPREHENSIVE METABOLIC PANEL
ALT: 19 U/L (ref 0–44)
AST: 23 U/L (ref 15–41)
Albumin: 3.8 g/dL (ref 3.5–5.0)
Alkaline Phosphatase: 58 U/L (ref 38–126)
Anion gap: 8 (ref 5–15)
BUN: 23 mg/dL (ref 8–23)
CO2: 24 mmol/L (ref 22–32)
Calcium: 8.7 mg/dL — ABNORMAL LOW (ref 8.9–10.3)
Chloride: 101 mmol/L (ref 98–111)
Creatinine, Ser: 0.69 mg/dL (ref 0.44–1.00)
GFR, Estimated: >60 mL/min (ref >60)
Glucose, Bld: 274 mg/dL — ABNORMAL HIGH (ref 70–99)
Potassium: 3.8 mmol/L (ref 3.5–5.1)
Sodium: 133 mmol/L — ABNORMAL LOW (ref 135–145)
Total Bilirubin: 0.3 mg/dL (ref 0.3–1.2)
Total Protein: 6.6 g/dL (ref 6.5–8.1)

## 2022-07-31 LAB — CBC WITH DIFFERENTIAL/PLATELET
Abs Immature Granulocytes: 0.01 10*3/uL (ref 0.00–0.07)
Basophils Absolute: 0.1 10*3/uL (ref 0.0–0.1)
Basophils Relative: 1 %
Eosinophils Absolute: 0 10*3/uL (ref 0.0–0.5)
Eosinophils Relative: 1 %
HCT: 30.1 % — ABNORMAL LOW (ref 36.0–46.0)
Hemoglobin: 10.2 g/dL — ABNORMAL LOW (ref 12.0–15.0)
Immature Granulocytes: 0 %
Lymphocytes Relative: 11 %
Lymphs Abs: 0.5 10*3/uL — ABNORMAL LOW (ref 0.7–4.0)
MCH: 34.6 pg — ABNORMAL HIGH (ref 26.0–34.0)
MCHC: 33.9 g/dL (ref 30.0–36.0)
MCV: 102 fL — ABNORMAL HIGH (ref 80.0–100.0)
Monocytes Absolute: 1 10*3/uL (ref 0.1–1.0)
Monocytes Relative: 19 %
Neutro Abs: 3.4 10*3/uL (ref 1.7–7.7)
Neutrophils Relative %: 68 %
Platelets: 231 10*3/uL (ref 150–400)
RBC: 2.95 MIL/uL — ABNORMAL LOW (ref 3.87–5.11)
RDW: 15.7 % — ABNORMAL HIGH (ref 11.5–15.5)
WBC: 5 10*3/uL (ref 4.0–10.5)
nRBC: 0 % (ref 0.0–0.2)

## 2022-07-31 LAB — TSH: TSH: 1.059 u[IU]/mL (ref 0.350–4.500)

## 2022-07-31 MED ORDER — SODIUM CHLORIDE 0.9 % IV SOLN
150.0000 mg | Freq: Once | INTRAVENOUS | Status: AC
  Administered 2022-07-31: 150 mg via INTRAVENOUS
  Filled 2022-07-31: qty 150

## 2022-07-31 MED ORDER — SODIUM CHLORIDE 0.9 % IV SOLN
600.0000 mg/m2 | Freq: Once | INTRAVENOUS | Status: AC
  Administered 2022-07-31: 940 mg via INTRAVENOUS
  Filled 2022-07-31: qty 47

## 2022-07-31 MED ORDER — SODIUM CHLORIDE 0.9 % IV SOLN
10.0000 mg | Freq: Once | INTRAVENOUS | Status: AC
  Administered 2022-07-31: 10 mg via INTRAVENOUS
  Filled 2022-07-31: qty 10

## 2022-07-31 MED ORDER — SODIUM CHLORIDE 0.9 % IV SOLN
200.0000 mg | Freq: Once | INTRAVENOUS | Status: AC
  Administered 2022-07-31: 200 mg via INTRAVENOUS
  Filled 2022-07-31: qty 8

## 2022-07-31 MED ORDER — PALONOSETRON HCL INJECTION 0.25 MG/5ML
0.2500 mg | Freq: Once | INTRAVENOUS | Status: AC
  Administered 2022-07-31: 0.25 mg via INTRAVENOUS
  Filled 2022-07-31: qty 5

## 2022-07-31 MED ORDER — DOXORUBICIN HCL CHEMO IV INJECTION 2 MG/ML
60.0000 mg/m2 | Freq: Once | INTRAVENOUS | Status: AC
  Administered 2022-07-31: 94 mg via INTRAVENOUS
  Filled 2022-07-31: qty 47

## 2022-07-31 MED ORDER — SODIUM CHLORIDE 0.9 % IV SOLN
Freq: Once | INTRAVENOUS | Status: AC
  Filled 2022-07-31: qty 250

## 2022-07-31 MED ORDER — HEPARIN SOD (PORK) LOCK FLUSH 100 UNIT/ML IV SOLN
500.0000 [IU] | Freq: Once | INTRAVENOUS | Status: AC | PRN
  Administered 2022-07-31: 500 [IU]
  Filled 2022-07-31: qty 5

## 2022-07-31 NOTE — Patient Instructions (Signed)
Instrucciones al darle de alta: Discharge Instructions Gracias por elegir al [facility] de Cncer de [place] para brindarle atencin mdica de oncologa y Music therapist.   Si usted tiene una cita de laboratorio con [place], por favor vaya directamente [place] y regstrese en el rea de Control and instrumentation engineer.   Use ropa cmoda y [place] para tener fcil acceso a las vas del Portacath (acceso venoso de Engineer, site duracin) o la lnea PICC (catter central colocado por va perifrica).   Nos esforzamos por ofrecerle tiempo de calidad con su proveedor. Es posible que tenga que volver a programar su cita si llega tarde (15 minutos o ms).  El llegar tarde le afecta a usted y a otros pacientes cuyas citas son posteriores a Merchandiser, retail.  Adems, si usted falta a tres o ms citas sin avisar a la oficina, puede ser retirado(a) de la clnica a discrecin del proveedor.      Para las solicitudes de renovacin de recetas, pida a su farmacia que se ponga en contacto con nuestra oficina y deje que transcurran 44 horas para que se complete el proceso de las renovaciones.    Hoy usted recibi los siguientes agentes de quimioterapia e/o inmunoterapia KEYTRUDA, ADRIAMYCIN, CYTOXAN      Para ayudar a prevenir las nuseas y los vmitos despus de su tratamiento, le recomendamos que tome su medicamento para las nuseas segn las indicaciones.  LOS SNTOMAS QUE DEBEN COMUNICARSE INMEDIATAMENTE SE INDICAN A CONTINUACIN: *FIEBRE SUPERIOR A 100.4 F (38 C) O MS *ESCALOFROS O SUDORACIN *NUSEAS Y VMITOS QUE NO SE CONTROLAN CON EL MEDICAMENTO PARA LAS NUSEAS *DIFICULTAD INUSUAL PARA RESPIRAR  *MORETONES O HEMORRAGIAS NO HABITUALES *PROBLEMAS URINARIOS (dolor o ardor al Garment/textile technologist o frecuencia para Garment/textile technologist) *PROBLEMAS INTESTINALES (diarrea inusual, estreimiento, dolor cerca del ano) SENSIBILIDAD EN LA BOCA Y EN LA GARGANTA CON O SIN LA PRESENCIA DE LCERAS (dolor de garganta, llagas en la boca o dolor de  muelas/dientes) ERUPCIN, HINCHAZN O DOLORES INUSUALES FLUJO VAGINAL INUSUAL O PICAZN/RASQUIA    Los puntos marcados con un asterisco ( *) indican una posible emergencia y debe hacer un seguimiento tan pronto como le sea posible o vaya al Departamento de Emergencias si se le presenta algn problema.  Por favor, muestre la [place] DE ADVERTENCIA DE Windy Canny DE ADVERTENCIA DE Benay Spice al registrarse en [address] de Emergencias y a la enfermera de triaje.  Si tiene preguntas despus de su visita o necesita cancelar o volver a programar su cita, por favor pngase en contacto con [facility] CAN[facility]ER CTR AT -MEDICAL ONCOLOGY  [phone]  y [place] instrucciones. Las horas de oficina son de 8:00 a.m. a 4:30 p.m. de lunes a viernes. Por favor, tenga en cuenta que los mensajes de voz que se dejan despus de las 4:00 p.m. posiblemente no se devolvern hasta el siguiente da de [place].  Cerramos los fines de semana y [employer]. En todo momento tiene acceso a una enfermera para preguntas urgentes. Por favor, llame al nmero principal de la clnica  [phone] y [place] instrucciones.   Para cualquier pregunta que no sea de carcter urgente, tambin puede ponerse en contacto con su proveedor [employer] Ahora ofrecemos visitas electrnicas para cualquier persona mayor de 18 aos que solicite atencin mdica en lnea para los sntomas que no sean urgentes. Para ms detalles vaya a [email or web address].   Tambin puede bajar la aplicacin de MyChart! Vaya a la tienda de aplicaciones, busque "MyChart", abra la  aplicacin, seleccione [place], e ingrese con su nombre de usuario y la contrasea de Pharmacist, community.  Las mscaras son opcionales en los centros de Hotel manager. Si desea que su equipo de cuidados mdicos use una [employer] atienden, por favor hgaselo saber al personal. Bethann Berkshire una persona de apoyo que tenga por lo menos 16 aos para  que le acompae a sus citas.  Pembrolizumab Injection Qu es este medicamento? El PEMBROLIZUMAB es un anticuerpo monoclonal. Se [place] para tratar ciertos tipos de cncer. Este medicamento puede ser utilizado para otros usos; si tiene alguna pregunta consulte con su proveedor de atencin mdica o con su farmacutico. MARCAS COMUNES: Keytruda Qu le debo informar a mi profesional de la salud antes de tomar este medicamento? Necesitan saber si usted presenta alguno de los siguientes problemas o situaciones: enfermedades autoinmunes tales como enfermedad de Crohn, colitis ulcerativa o lupus ha tenido o planea tener un trasplante alognico de Social research officer, government ([place] las clulas madre de Theatre manager persona) antecedentes de trasplante de rganos antecedentes de radiacin en el pecho problemas del sistema nervioso, tales como miastenia grave o sndrome de Curator una reaccin alrgica o inusual al pembrolizumab, a otros medicamentos, alimentos, colorantes o conservantes si est embarazada o buscando quedar embarazada si est amamantando a un beb Cmo debo utilizar este medicamento? Este medicamento se administra mediante infusin en una vena. Lo administra un profesional de Technical sales engineer en un hospital o en un entorno clnico. Se le entregar una Gua del medicamento (MedGuide, su nombre en ingls) especial antes de cada tratamiento. Asegrese de leer esta informacin cada vez cuidadosamente. Hable con su pediatra para informarse acerca del uso de este medicamento en nios. Aunque este medicamento se puede recetar a nios tan pequeos como de 6 meses de edad con ciertas afecciones, existen precauciones que deben tomarse. Sobredosis: Pngase en contacto inmediatamente con un centro toxicolgico o una sala de urgencia si usted cree que haya tomado demasiado medicamento. ATENCIN: [employer] es solo para usted. No comparta este medicamento con nadie. Qu sucede si me olvido de una dosis? Es importante  no olvidar ninguna dosis. Informe a su mdico o a su profesional de la salud si no puede asistir a Photographer. Qu puede interactuar con este medicamento? No se han estudiado las interacciones. Puede ser que esta lista no menciona todas las posibles interacciones. Informe a su profesional de KB Home	[place] de [employer] productos a base de hierbas, medicamentos de [place] o suplementos nutritivos que est tomando. Si usted fuma, consume bebidas alcohlicas o si utiliza drogas ilegales, indqueselo tambin a su profesional de KB Home	[place]. Algunas sustancias pueden interactuar con su medicamento. A qu debo estar atento al usar Coca-Cola? Se supervisar su estado de salud atentamente mientras reciba este medicamento. Usted podra necesitar realizarse [employer] de sangre mientras est usando [place]. No debe quedar embarazada mientras est usando este medicamento o por 4 meses despus de dejar de usarlo. Las mujeres deben informar a su mdico si estn buscando quedar embarazadas o si creen que podran estar embarazadas. Existe la posibilidad de efectos secundarios graves en un beb sin nacer. Para obtener ms informacin, hable con su profesional de la salud o su farmacutico. No debe amamantar a un beb mientras est usando este medicamento o durante 4 meses despus de la ltima dosis. Qu efectos secundarios puedo tener al [employer] este medicamento? Efectos secundarios que debe informar a su mdico o a Barrister's clerk de la salud tan pronto como sea posible: Chief of Staff,  tales como erupcin cutnea, comezn/picazn o urticaria, e hinchazn de la cara, los labios o la lengua con sangre o de color negro y aspecto alquitranado problemas para respirar cambios en la visin dolor en el pecho escalofros confusin estreimiento tos diarrea mareo, sensacin de desmayo o aturdimiento frecuencia cardiaca rpida o irregular fiebre enrojecimiento dolor en las articulaciones recuentos  sanguneos bajos: este medicamento podra reducir la cantidad de glbulos blancos, glbulos rojos y plaquetas. Su riesgo de infeccin y sangrado podra ser mayor. dolor muscular debilidad muscular dolor, hormigueo o entumecimiento de las manos o los pies dolor de cabeza persistente enrojecimiento, formacin de ampollas, descamacin o distensin de la piel, incluso dentro de la boca signos y sntomas de niveles elevados de azcar en la sangre, tales como mareo; boca seca; piel seca; aliento frutal; nuseas; dolor de estmago; aumento del apetito o la sed; aumento de la frecuencia urinaria signos y sntomas de lesin renal, tales como dificultad para Garment/textile technologist o cambios en la cantidad de orina signos y sntomas de lesin en el hgado, como orina oscura, heces claras, prdida del apetito, nuseas, dolor en la regin abdominal superior derecha, color amarillento de los ojos o la piel sudoracin ganglios linfticos inflamados prdida de peso Efectos secundarios que generalmente no requieren atencin mdica (infrmelos a su mdico o a su profesional de la salud si persisten o si son molestos): disminucin del apetito cada del cabello cansancio Puede ser que esta lista no menciona todos los posibles efectos secundarios. Comunquese a su mdico por asesoramiento mdico [employer]. Usted puede informar los efectos secundarios a la FDA por telfono al 1-800-FDA-1088. Dnde debo guardar mi medicina? Este medicamento se administra en hospitales o clnicas, y no necesitar guardarlo en su domicilio. ATENCIN: Este folleto es un resumen. Puede ser que no cubra toda la posible informacin. Si usted tiene preguntas acerca de esta medicina, consulte con su mdico, su farmacutico o su profesional de Technical sales engineer.  2023 Elsevier/Gold Standard (2020-03-25 00:00:00) Doxorubicin Injection Qu es este medicamento? La DOXORRUBICINA es un agente quimioteraputico. Se [place] para tratar muchos tipos de  cncer tales como leucemia, linfoma, neuroblastoma, sarcoma y tumor de [place]. Tambin se [place] para tratar cncer de vejiga, cncer de mama, cncer de pulmn, cncer de ovario, cncer de estmago, y cncer de tiroides. Este medicamento puede ser utilizado para otros usos; si tiene alguna pregunta consulte con su proveedor de atencin mdica o con su farmacutico. MARCAS COMUNES: Adriamycin, Adriamycin PFS, Adriamycin RDF, Rubex Qu le debo informar a mi profesional de la salud antes de tomar este medicamento? Necesitan saber si usted presenta alguno de los siguientes problemas o situaciones: enfermedad cardiaca antecedentes de conteos sanguneos bajos causados por un medicamento enfermedad heptica terapia de radiacin en curso o reciente una reaccin alrgica o inusual a la doxorrubicina, a otros medicamentos quimioteraputicos, a otros medicamentos, alimentos, colorantes o conservantes si est embarazada o buscando quedar embarazada si est amamantando a un beb Cmo debo utilizar este medicamento? Este medicamento se administra mediante infusin por va intravenosa. Lo administra un profesional de la salud calificado en un hospital o en un entorno clnico. Si experimenta dolor, hinchazn, ardor o cualquier sensacin inusual alrededor del lugar de la inyeccin, informe inmediatamente a su profesional de KB Home	[place]. Hable con su pediatra para informarse acerca del uso de este medicamento en nios. Puede requerir atencin especial. Sobredosis: Pngase en contacto inmediatamente con un centro toxicolgico o una sala de urgencia si usted cree que haya tomado demasiado medicamento. ATENCIN: Luna Fuse  es solo para usted. No comparta este medicamento con nadie. Qu sucede si me olvido de una dosis? Es importante no olvidar ninguna dosis. Informe a su mdico o a su profesional de la salud si no puede asistir a Photographer. Qu puede interactuar con este medicamento? Este medicamento podra  interactuar con los siguientes frmacos: 6-mercaptopurina paclitaxel fenitona hierba de [place] trastuzumab verapamilo Puede ser que esta lista no menciona todas las posibles interacciones. Informe a su profesional de KB Home	[place] de [employer] productos a base de hierbas, medicamentos de [place] o suplementos nutritivos que est tomando. Si usted fuma, consume bebidas alcohlicas o si utiliza drogas ilegales, indqueselo tambin a su profesional de KB Home	[place]. Algunas sustancias pueden interactuar con su medicamento. A qu debo estar atento al usar Coca-Cola? Este medicamento podra hacerle sentir un Nurse, mental health. Esto es normal, ya que la quimioterapia puede afectar tanto a las clulas sanas como a las clulas cancerosas. Si presenta algn efecto secundario, infrmelo. Contine con el tratamiento aun si se siente enfermo, a menos que su mdico le indique que lo suspenda. Hay una cantidad mxima de este medicamento que se puede recibir a lo largo de la vida. La cantidad depende de la afeccin mdica por la que est recibiendo tratamiento y su salud en general. Su mdico supervisar la cantidad de este medicamento que reciba durante su vida. Informe a su mdico si ya ha recibido Coca-Cola. Usted podra necesitar realizarse [employer]. [employer] Worldwide de sangre mientras est usando [place]. Su orina podr volverse roja durante unos das despus de la dosis. Esto no es Biochemist, clinical. Si su orina es oscura o [place], llame a su mdico. En algunos casos, podra recibir [employer] adicionales para ayudarlo con los efectos secundarios. Siga todas las instrucciones para usarlos. Consulte a su mdico o a su profesional de la salud si tiene fiebre, escalofros o dolor de garganta, o cualquier otro sntoma de resfro o gripe. No se trate usted mismo. Este medicamento reduce la capacidad del cuerpo para combatir infecciones. Trate de no acercarse a personas que estn enfermas. Este medicamento podra aumentar  el riesgo de moretones o sangrado. Consulte a su mdico o a su profesional de la salud si observa sangrados inusuales. Consulte con su mdico acerca de su riesgo de cncer. Usted puede tener mayor riesgo para ciertos tipos de cncer si toma este medicamento. No debe quedar embarazada mientras est usando este medicamento o por 6 meses despus de dejar de usarlo. Las mujeres deben informar a su mdico si estn buscando quedar embarazadas o si creen que podran estar embarazadas. Los hombres no deben tener hijos mientras estn recibiendo Coca-Cola y [place] 6 meses despus de dejar de usarlo. Existe la posibilidad de efectos secundarios graves en un beb sin nacer. Para obtener ms informacin, hable con su profesional de la salud o su farmacutico. No debe amamantar a un beb mientras est tomando este medicamento. Este medicamento ha causado insuficiencia ovrica en algunas mujeres y recuentos de esperma reducidos en algunos hombres. Este medicamento puede interferir con la capacidad de tener hijos. Hable con su mdico o su profesional de la salud si est preocupado por su fertilidad. Este medicamento puede causar una disminucin de la Co-enzima Q-10. Debe asegurarse de recibir suficiente Co-enzima Q-10 mientras toma este medicamento. Converse con su profesional de la salud sobre los alimentos que come y las vitaminas que toma. Qu efectos secundarios puedo tener al [employer] este medicamento? Efectos secundarios que debe informar a su mdico o a  su profesional de la salud tan pronto como sea posible: Chief of Staff, como erupcin cutnea, comezn/picazn o urticarias, e hinchazn de la cara, los labios o la lengua problemas respiratorios dolor en el pecho ritmo cardiaco rpido o irregular recuentos sanguneos bajos: este medicamento podra reducir la cantidad de glbulos blancos, glbulos rojos y plaquetas. Su riesgo de infeccin y sangrado podra ser mayor. dolor, enrojecimiento o  Actor de la inyeccin signos de infeccin: fiebre o escalofros, tos, dolor de garganta, dolor o dificultad para orinar signos de disminucin en la cantidad de plaquetas o sangrado: moretones, puntos rojos en la piel, heces de color negro y aspecto alquitranado, sangre en la orina hinchazn de tobillos, pies, manos cansancio debilidad Efectos secundarios que generalmente no requieren atencin mdica (infrmelos a su mdico o a su profesional de la salud si persisten o si son molestos): diarrea cada del cabello llagas en la boca decoloracin o dao en las uas nuseas orina roja vmito Puede ser que esta lista no menciona todos los posibles efectos secundarios. Comunquese a su mdico por asesoramiento mdico [employer]. Usted puede informar los efectos secundarios a la FDA por telfono al 1-800-FDA-1088. Dnde debo guardar mi medicina? Este medicamento se administra en hospitales o clnicas y no necesitar guardarlo en su domicilio. ATENCIN: Este folleto es un resumen. Puede ser que no cubra toda la posible informacin. Si usted tiene preguntas acerca de esta medicina, consulte con su mdico, su farmacutico o su profesional de Technical sales engineer.  2023 Elsevier/Gold Standard (2017-09-13 00:00:00)  Cyclophosphamide Injection Qu es este medicamento? La CICLOFOSFAMIDA trata algunos tipos de cncer. Acta desacelerando el crecimiento de clulas cancerosas. Este medicamento puede ser utilizado para otros usos; si tiene alguna pregunta consulte con su proveedor de atencin mdica o con su farmacutico. MARCAS COMUNES: Cyclophosphamide, Cytoxan, Neosar Qu le debo informar a mi profesional de la salud antes de tomar este medicamento? Necesitan saber si usted presenta alguno de los siguientes problemas o situaciones: Enfermedad cardiaca Ritmo o frecuencia cardiaca irregular Infeccin Problemas renales Enfermedad heptica Niveles bajos de glbulos sanguneos  (glbulos blancos, plaquetas o glbulos rojos) Enfermedad pulmonar Radioterapia previa Problemas al Cyril Loosen reaccin alrgica o inusual a la ciclofosfamida, a otros medicamentos, alimentos, colorantes o conservantes Si est embarazada o buscando quedar embarazada Si est amamantando a un beb Cmo debo utilizar este medicamento? Este medicamento se inyecta en una vena. Su equipo de atencin lo [employer] en un hospital o en un entorno clnico. Hable con su equipo de atencin sobre el uso de este medicamento en nios. Puede requerir atencin especial. Sobredosis: Pngase en contacto inmediatamente con un centro toxicolgico o una sala de urgencia si usted cree que haya tomado demasiado medicamento. ATENCIN: [employer] es solo para usted. No comparta este medicamento con nadie. Qu sucede si me olvido de una dosis? Cumpla con las citas para dosis de seguimiento. Es importante no olvidar ninguna dosis. Llame a su equipo de atencin si no puede asistir a una cita. Qu puede interactuar con este medicamento? Anfotericina B Amiodarona Azatioprina Ciertos medicamentos antivirales para VIH o hepatitis Ciertos medicamentos para la presin sangunea, tales como enalapril, lisinopril, quinapril Ciclosporina Diurticos Etanercept Indometacina Medicamentos para relajar los msculos Metronidazol Natalizumab Tamoxifeno Warfarina Puede ser que esta lista no menciona todas las posibles interacciones. Informe a su profesional de KB Home	[place] de [employer] productos a base de hierbas, medicamentos de [place] o suplementos nutritivos que est tomando. Si usted fuma, consume bebidas alcohlicas o si [place] drogas  ilegales, indqueselo tambin a su profesional de KB Home	[place]. Algunas sustancias pueden interactuar con su medicamento. A qu debo estar atento al usar Coca-Cola? Este medicamento podra hacerle sentir un Nurse, mental health. Esto no es inusual, ya que la quimioterapia puede  afectar tanto a las clulas sanas como a las clulas cancerosas. Si presenta algn efecto secundario, infrmelo. Contine con el tratamiento incluso si se siente enfermo, a menos que su equipo de [employer] lo suspenda. Usted podra necesitar realizarse [employer]. [employer] Worldwide de sangre mientras est usando [place]. Este medicamento puede aumentar su riesgo de contraer una infeccin. Llame para pedir consejo a su equipo de atencin si tiene fiebre, escalofros, dolor de garganta o cualquier otro sntoma de resfriado o gripe. No se trate usted mismo. Trate de no acercarse a personas que estn enfermas. Evite usar [employer] contienen aspirina, acetaminofeno, ibuprofeno, naproxeno o ketoprofeno, a menos que as lo indique su equipo de atencin. Estos medicamentos pueden ocultar la fiebre. Proceda con cuidado al cepillar sus dientes, usar hilo dental o Risk manager palillos para los dientes, ya que podra contraer una infeccin o Therapist, art con mayor facilidad. Si le van a Artist, dgale a su dentista que est recibiendo Coca-Cola. Beba agua u otros lquidos segn lo indicado. Orine con frecuencia, incluso de noche. Algunos productos pueden contener alcohol. Pregunte a su equipo de atencin si este medicamento contiene alcohol. Asegrese de informar a todos los equipos de atencin que est usando este medicamento. Ciertos medicamentos, como metronidazol y disulfiram, pueden causar una reaccin desagradable cuando se usan con alcohol. Esta reaccin incluye enrojecimiento, dolor de cabeza, nuseas, vmitos, sudoracin y aumento de la sed. La reaccin puede durar de 30 minutos a varias horas. Informe a su equipo de atencin si est buscando un embarazo o si cree que podra estar en embarazo. Este medicamento puede causar defectos congnitos graves si se toma durante el Media planner y durante 1 ao despus de Health and safety inspector. Se requiere una prueba de embarazo con  resultado negativo antes de Medical laboratory scientific officer a Solicitor. Se recomienda usar un mtodo anticonceptivo confiable mientras [place] este medicamento y por 1 ao despus de dejar de usarlo. Hable con su equipo de atencin sobre un mtodo anticonceptivo confiable. Use un condn durante las relaciones sexuales mientras est usando este medicamento y por 4 meses despus de la ltima dosis. Informe a su equipo de atencin de inmediato si cree que su pareja podra estar embarazada. Este medicamento puede causar defectos congnitos graves. No debe amamantar mientras est tomando este medicamento o por 1 semana despus de dejar de usarlo. Este medicamento podra causar infertilidad. Hable con su equipo de atencin si le preocupa su fertilidad. Hable con su equipo de atencin sobre su riesgo de cncer. Usted podra tener mayor riesgo de desarrollar ciertos tipos de cncer si [place] este medicamento. Qu efectos secundarios puedo tener al [employer] este medicamento? Efectos secundarios que debe informar a su equipo de atencin tan pronto como sea posible: Reacciones alrgicas: erupcin cutnea, comezn/picazn, urticaria, hinchazn de la cara, los labios, la lengua o la garganta Tos seca, falta de aire o problemas para respirar Insuficiencia cardiaca: falta de aire, hinchazn de los tobillos, los pies o las manos, aumento de peso repentino, debilidad o fatiga inusuales Inflamacin del msculo cardiaco: debilidad o fatiga inusuales, falta de aire, dolor en el pecho, frecuencia cardaca rpida o irregular, mareos, hinchazn de los tobillos, los pies o las manos. Cambios en el ritmo cardiaco: frecuencia cardiaca rpida o  irregular, mareos, sensacin de [employer] o aturdimiento, Tourist information centre manager, dificultad para respirar Infeccin: fiebre, escalofros, tos, dolor de garganta, heridas que no sanan, dolor o problemas para Garment/textile technologist, sensacin general de molestia o malestar Lesin en los riones: disminucin en la cantidad de  orina, hinchazn de los tobillos, las manos o los pies Lesin en el hgado: Social research officer, government en la regin abdominal superior derecha, prdida de apetito, nuseas, heces de color claro, orina amarilla oscura o marrn, color amarillento de los ojos o la piel, debilidad o fatiga inusuales Recuento bajo de glbulos rojos: debilidad o fatiga inusuales, mareo, dolor de [place], dificultad para respirar Nivel bajo de sodio: debilidad muscular, fatiga, mareos, dolor de cabeza, confusin Orina roja o marrn oscuro Sangrado o moretones inusuales Efectos secundarios que generalmente no requieren atencin mdica (debe informarlos a su equipo de atencin si persisten o si son molestos): Cada del cabello Ciclos menstruales irregulares o sangrado ligero entre periodos menstruales Prdida del apetito Audiological scientist, enrojecimiento o hinchazn con llagas dentro de la boca o la garganta Vmito Puede ser que esta lista no menciona todos los posibles efectos secundarios. Comunquese a su mdico por asesoramiento mdico [employer] Usted puede informar los efectos secundarios a la FDA por telfono al 1-800-FDA-1088. Dnde debo guardar mi medicina? Este medicamento se administra en hospitales o clnicas. No se guarda en su casa. ATENCIN: Este folleto es un resumen. Puede ser que no cubra toda la posible informacin. Si usted tiene preguntas acerca de esta medicina, consulte con su mdico, su farmacutico o su profesional de Technical sales engineer.  2023 Elsevier/Gold Standard (2022-04-19 00:00:00)

## 2022-07-31 NOTE — Progress Notes (Signed)
  Pt states with new txs she is not able to eat a week after chemo; fatigue weakness, feelings of sadness/depression.

## 2022-07-31 NOTE — Progress Notes (Signed)
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  It showed a suspicious mass at the 5:30 position of the right breast 4 cm from the nipple measuring 1.8 x 1.6 x 1.2 cm.  There may be a thickened echogenic rim which would bring the measurement to 2.9 x 1.9 x 2.3 cm.  Overlying skin thickening.  No axillary adenopathy.  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  Patient referred for further management.  History obtained with the help of Spanish interpreter.  Patient is independent of her ADLs at baseline.She reports some baseline diabetic neuropathy for which she is on gabapentin.   Family history significant for breast cancer in her sister  in her 49s.  She is here with her daughter today.  Reports her appetite and weight haveRemained stable.  She is anxious about her next steps with regards to breast cancer.   Patient is being treated with neoadjuvant intent with keynote 522 regimen starting with CarboTaxol Keytruda chemotherapy on 02/27/2022  Interval history-tolerating chemotherapy well so far.  Neuropathy is stable.  Reports no worsening  fatigue.  ECOG PS- 1 Pain scale- 0   Review of systems- Review of Systems  Constitutional:  Positive for malaise/fatigue. Negative for chills, fever and weight loss.  HENT:  Negative for congestion, ear discharge and nosebleeds.   Eyes:  Negative for blurred vision.  Respiratory:  Negative for cough, hemoptysis, sputum production, shortness of breath and wheezing.   Cardiovascular:  Negative for chest pain, palpitations, orthopnea and claudication.  Gastrointestinal:  Negative for abdominal pain, blood in stool, constipation, diarrhea, heartburn, melena, nausea and vomiting.  Genitourinary:  Negative for dysuria, flank pain, frequency, hematuria and urgency.  Musculoskeletal:  Negative for back pain, joint pain and myalgias.  Skin:  Negative for rash.  Neurological:  Negative for dizziness, tingling, focal weakness, seizures, weakness and headaches.  Endo/Heme/Allergies:  Does not bruise/bleed easily.  Psychiatric/Behavioral:  Negative for depression and suicidal ideas. The patient does not have insomnia.       Allergies  Allergen Reactions   Contrast Media  [Iodinated Contrast Media] Nausea And Vomiting and Other (See Comments)    Pt was pre medicated 03/28/22 and 3 ML of contrast injected per [facility] Cath check.  Pt reported no problems.      Past Medical History:  Diagnosis Date   Breast cancer, right breast ([place])    Diabetes mellitus without complication ([place])    Family history of prostate cancer    Gastritis    Heart murmur    History of pancreatitis    Hyperlipidemia    Hypertension    Palpitations      Past Surgical History:  Procedure Laterality Date  BREAST BIOPSY Right 11/2015   benign   BREAST BIOPSY Right 01/24/2022   INVASIVE MAMMARY CARCINOMA, NO SPECIAL TYPE   BREAST CYST EXCISION Right    CESAREAN SECTION     2x   CHOLECYSTECTOMY     IR CV LINE INJECTION  03/28/2022   PORTACATH PLACEMENT N/A 03/01/2022   Procedure: INSERTION PORT-A-CATH;  Surgeon:  Herbert Pun, MD;  Location: [facility] ORS;  Service: General;  Laterality: N/A;    Social History   Socioeconomic History   Marital status: Married    Spouse name: Not on file   Number of children: Not on file   Years of education: Not on file   Highest education level: Not on file  Occupational History   Not on file  Tobacco Use   Smoking status: Never   Smokeless tobacco: Never  Vaping Use   Vaping Use: Never used  Substance and Sexual Activity   Alcohol use: Never   Drug use: Never   Sexual activity: Not on file  Other Topics Concern   Not on file  Social History Narrative   Not on file   Social Determinants of Health   Financial Resource Strain: Low Risk  (02/24/2022)   Overall Financial Resource Strain (CARDIA)    Difficulty of Paying Living Expenses: Not very hard  Food Insecurity: No Food Insecurity (02/24/2022)   Hunger Vital Sign    Worried About Running Out of Food in the Last Year: Never true    Ran Out of Food in the Last Year: Never true  Transportation Needs: No Transportation Needs (02/24/2022)   PRAPARE - Hydrologist (Medical): No    Lack of Transportation (Non-Medical): No  Physical Activity: Inactive (02/24/2022)   Exercise Vital Sign    Days of Exercise per Week: 0 days    Minutes of Exercise per Session: 0 min  Stress: No Stress Concern Present (02/24/2022)   [place]    Feeling of Stress : Only a little  Social Connections: Moderately Integrated (02/24/2022)   Social Connection and Isolation Panel [NHANES]    Frequency of Communication with Friends and Family: Three times a week    Frequency of Social Gatherings with Friends and Family: Three times a week    Attends Religious Services: 1 to 4 times per year    Active Member of Clubs or Organizations: No    Attends Archivist Meetings: Never    Marital Status: Married  Human resources officer  Violence: Not At Risk (02/24/2022)   Humiliation, Afraid, Rape, and Kick questionnaire    Fear of Current or Ex-Partner: No    Emotionally Abused: No    Physically Abused: No    Sexually Abused: No    Family History  Problem Relation Age of Onset   Heart disease Father    Breast cancer Sister        59s   Prostate cancer Paternal Grandfather    Stomach cancer Cousin      Current Outpatient Medications:    atorvastatin (LIPITOR) 80 MG tablet, Take 80 mg by mouth daily., Disp: , Rfl:    Desoximetasone 0.05 % OINT, Apply 1 Dose topically 3 (three) times daily as needed (Erythematous macular rash noted over bilateral knuckles)., Disp: 60 g, Rfl: 1   dexamethasone (DECADRON) 4 MG tablet, Take 2 tablets (8 mg total) by mouth daily. Start the day after chemotherapy for 2 days., Disp: 30 tablet, Rfl:  1   DULoxetine (CYMBALTA) 60 MG capsule, Take 60 mg by mouth daily., Disp: , Rfl:    enalapril (VASOTEC) 20 MG tablet, Take 20 mg by mouth daily., Disp: , Rfl:    fenofibrate (TRICOR) 145 MG tablet, Take 145 mg by mouth daily., Disp: , Rfl:    fluticasone (FLONASE) 50 MCG/ACT nasal spray, Place 2 sprays into both nostrils daily as needed for allergies or rhinitis., Disp: , Rfl:    folic acid (FOLVITE) 1 MG tablet, Take 1 tablet (1 mg total) by mouth daily., Disp: 30 tablet, Rfl: 3   gabapentin (NEURONTIN) 300 MG capsule, Take 300 mg by mouth in the morning and at bedtime., Disp: , Rfl:    glipiZIDE (GLUCOTROL XL) 2.5 MG 24 hr tablet, Take 2.5 mg by mouth daily., Disp: , Rfl:    hydrochlorothiazide (MICROZIDE) 12.5 MG capsule, Take 12.5 mg by mouth daily., Disp: , Rfl:    lidocaine-prilocaine (EMLA) cream, Apply to affected area once, Disp: 30 g, Rfl: 3   LORazepam (ATIVAN) 0.5 MG tablet, Take 1 tablet (0.5 mg total) by mouth every 6 (six) hours as needed (Nausea or vomiting)., Disp: 30 tablet, Rfl: 0   Melatonin 5 MG CAPS, Take by mouth., Disp: , Rfl:    ondansetron (ZOFRAN) 8 MG tablet, Take 1  tablet (8 mg total) by mouth 2 (two) times daily as needed for refractory nausea / vomiting. Start on day 3 after chemo., Disp: 30 tablet, Rfl: 1   predniSONE (DELTASONE) 10 MG tablet, Take by mouth., Disp: , Rfl:    prochlorperazine (COMPAZINE) 10 MG tablet, Take 1 tablet (10 mg total) by mouth every 6 (six) hours as needed (Nausea or vomiting)., Disp: 30 tablet, Rfl: 1   tiZANidine (ZANAFLEX) 4 MG tablet, Take 4 mg by mouth every 8 (eight) hours as needed for muscle spasms., Disp: , Rfl:    vitamin C (ASCORBIC ACID) 500 MG tablet, Take 500 mg by mouth daily., Disp: , Rfl:    aspirin 81 MG EC tablet, Take 1 tablet by mouth daily. (Patient not taking: Reported on 05/15/2022), Disp: , Rfl:    meloxicam (MOBIC) 15 MG tablet, Take 15 mg by mouth daily as needed for pain. (Patient not taking: Reported on 07/31/2022), Disp: , Rfl:    metoprolol succinate (TOPROL XL) 25 MG 24 hr tablet, Take 1 tablet (25 mg total) by mouth daily. (Patient not taking: Reported on 07/31/2022), Disp: 90 tablet, Rfl: 3   traMADol (ULTRAM) 50 MG tablet, Take 1 tablet (50 mg total) by mouth every 6 (six) hours as needed. (Patient not taking: Reported on 03/20/2022), Disp: 10 tablet, Rfl: 0 No current facility-administered medications for this visit.  Facility-Administered Medications Ordered in Other Visits:    cyclophosphamide (CYTOXAN) 940 mg in sodium chloride 0.9 % 250 mL chemo infusion, 600 mg/m2 (Treatment Plan Recorded), Intravenous, Once, Sindy Guadeloupe, MD   DOXOrubicin (ADRIAMYCIN) chemo injection 94 mg, 60 mg/m2 (Treatment Plan Recorded), Intravenous, Once, Sindy Guadeloupe, MD   heparin lock flush 100 unit/mL, 500 Units, Intracatheter, Once PRN, Sindy Guadeloupe, MD  Physical exam:  Vitals:   07/31/22 0836  BP: (!) 115/53  Pulse: 77  Resp: 16  Temp: 98.7 F (37.1 C)  SpO2: 99%  Weight: 121 lb 12.8 oz (55.2 kg)   Physical Exam Constitutional:      General: She is not in acute distress. Cardiovascular:     Rate  and Rhythm: Normal rate and regular rhythm.  Heart sounds: Normal heart sounds.  Pulmonary:     Effort: Pulmonary effort is normal.     Breath sounds: Normal breath sounds.  Skin:    General: Skin is warm and dry.  Neurological:     Mental Status: She is alert and oriented to person, place, and time.         Latest Ref Rng & Units 07/31/2022    8:05 AM  CMP  Glucose 70 - 99 mg/dL 274   BUN 8 - 23 mg/dL 23   Creatinine 0.44 - 1.00 mg/dL 0.69   Sodium 135 - 145 mmol/L 133   Potassium 3.5 - 5.1 mmol/L 3.8   Chloride 98 - 111 mmol/L 101   CO2 22 - 32 mmol/L 24   Calcium 8.9 - 10.3 mg/dL 8.7   Total Protein 6.5 - 8.1 g/dL 6.6   Total Bilirubin 0.3 - 1.2 mg/dL 0.3   Alkaline Phos 38 - 126 U/L 58   AST 15 - 41 U/L 23   ALT 0 - 44 U/L 19       Latest Ref Rng & Units 07/31/2022    8:05 AM  CBC  WBC 4.0 - 10.5 K/uL 5.0   Hemoglobin 12.0 - 15.0 g/dL 10.2   Hematocrit 36.0 - 46.0 % 30.1   Platelets 150 - 400 K/uL 231     Assessment and plan- Patient is a 66 y.o. female with triple negative breast cancer stage IIb and cT2 N0 M0.  She is here for prior to cycle 4 of AC Keytruda chemotherapy neoadjuvant  Counts ok to proceed with cycle 4 of ac keytruda chemotherapy today with udenyca on day 3. This will be her last neoadjuvant chemo  I have reached out to dr. Peyton Najjar to discuss surgical options again- lumpectomy versus mastectomy. I am proceeding with b/l breast MRI as well  Following surgery based on final pathology- she may need adjuvant xleloda if she does not achieve PCR.   Radiation therapy will depend on nodal status and lumpectomy versus mastectomy  She will need adjuvant keytruda for 9 cycles post surgery as per keynote 522.  Chemo induced anemia- stable. Continue to monitor  F/u with me to be decided based on surgery date   Visit Diagnosis 1. Antineoplastic chemotherapy induced anemia   2. Encounter for antineoplastic chemotherapy   3. Malignant neoplasm of  lower-inner quadrant of right breast of female, estrogen receptor negative ([place])      Dr. Randa Evens, MD, MPH [facility] [phone] 07/31/2022 11:18 AM               Hello 8 yes  Remember loose EDS

## 2022-08-01 ENCOUNTER — Other Ambulatory Visit: Payer: Self-pay

## 2022-08-02 ENCOUNTER — Inpatient Hospital Stay: Payer: Medicare HMO

## 2022-08-02 DIAGNOSIS — C50311 Malignant neoplasm of lower-inner quadrant of right female breast: Secondary | ICD-10-CM

## 2022-08-02 DIAGNOSIS — Z5112 Encounter for antineoplastic immunotherapy: Secondary | ICD-10-CM | POA: Diagnosis not present

## 2022-08-02 MED ORDER — PEGFILGRASTIM-CBQV 6 MG/0.6ML ~~LOC~~ SOSY
6.0000 mg | PREFILLED_SYRINGE | Freq: Once | SUBCUTANEOUS | Status: AC
  Administered 2022-08-02: 6 mg via SUBCUTANEOUS
  Filled 2022-08-02: qty 0.6

## 2022-08-04 ENCOUNTER — Other Ambulatory Visit: Payer: Self-pay

## 2022-08-09 ENCOUNTER — Ambulatory Visit
Admission: RE | Admit: 2022-08-09 | Discharge: 2022-08-09 | Disposition: A | Discharge status: Home / Self Care | Payer: Medicare HMO | Source: Ambulatory Visit | Attending: Oncology | Admitting: Oncology

## 2022-08-09 DIAGNOSIS — C50311 Malignant neoplasm of lower-inner quadrant of right female breast: Secondary | ICD-10-CM | POA: Diagnosis not present

## 2022-08-09 DIAGNOSIS — Z171 Estrogen receptor negative status [ER-]: Secondary | ICD-10-CM | POA: Insufficient documentation

## 2022-08-09 MED ORDER — GADOBUTROL 1 MMOL/ML IV SOLN
5.0000 mL | Freq: Once | INTRAVENOUS | Status: AC | PRN
  Administered 2022-08-09: 5 mL via INTRAVENOUS

## 2022-08-10 ENCOUNTER — Ambulatory Visit: Payer: Self-pay | Admitting: General Surgery

## 2022-08-10 ENCOUNTER — Other Ambulatory Visit: Payer: Self-pay | Admitting: General Surgery

## 2022-08-10 DIAGNOSIS — C50311 Malignant neoplasm of lower-inner quadrant of right female breast: Secondary | ICD-10-CM

## 2022-08-10 NOTE — H&P (Signed)
PATIENT PROFILE: Tersea Aulds is a 66 y.o. female who presents to the Clinic for evaluation of surgical intervention for breast cancer.  PCP: Clinic, General Medical  HISTORY OF PRESENT ILLNESS: Ms. Ferrel Logan was diagnosed with breast cancer on February 2023. 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 Preneoadjuvant chemotherapy MRI shows possible involvement of the skin. Patient was found with stage IIb, cT2, N0, M0 right breast cancer. Patient completed 4 cycle of AC Keytruda on 07/31/2022. She has been tolerating adequately. She had an ultrasound of the breast that shows decrease in size of the renal mass to 1.5 cm. This is a sign of good response to neoadjuvant chemotherapy.  Today the patient comes for discussion of surgical alternative for her breast cancer.  PROBLEM LIST: Malignant neoplasm of the right breast  GENERAL REVIEW OF SYSTEMS:   General ROS: negative for - chills, fatigue, fever, weight gain or weight loss Allergy and Immunology ROS: negative for - hives  Hematological and Lymphatic ROS: negative for - bleeding problems or bruising, negative for palpable nodes Endocrine ROS: negative for - heat or cold intolerance, hair changes Respiratory ROS: negative for - cough, shortness of breath or wheezing Cardiovascular ROS: no chest pain or palpitations GI ROS: negative for nausea, vomiting, abdominal pain, diarrhea, constipation Musculoskeletal ROS: negative for - joint swelling or muscle pain Neurological ROS: negative for - confusion, syncope Dermatological ROS: negative for pruritus and rash Psychiatric: negative for anxiety, depression, difficulty sleeping and memory loss  MEDICATIONS: Current Outpatient Medications  Medication Sig Dispense Refill  atorvastatin (LIPITOR) 40 MG tablet Take 40 mg by mouth once daily  DULoxetine (CYMBALTA) 60 MG DR capsule Take by mouth once daily  enalapril (VASOTEC) 20 MG tablet Take 20 mg by mouth once daily  fenofibrate  nanocrystallized (TRICOR) 145 MG tablet Take by mouth once daily  fluticasone propionate (FLONASE) 50 mcg/actuation nasal spray as needed  gabapentin (NEURONTIN) 300 MG capsule Take by mouth 2 (two) times daily  glipiZIDE (GLUCOTROL) 2.5 MG XL tablet Take by mouth once daily  hydroCHLOROthiazide (MICROZIDE) 12.5 mg capsule Take by mouth once daily  meloxicam (MOBIC) 15 MG tablet as needed  metoprolol succinate (TOPROL-XL) 25 MG XL tablet Take 1 tablet by mouth once daily  naproxen (NAPROSYN) 500 MG tablet Take 500 mg by mouth as needed  LIDODERM 5 % patch once daily (Patient not taking: Reported on 08/02/2022)  tiZANidine (ZANAFLEX) 4 MG tablet Take 4 mg by mouth as needed (Patient not taking: Reported on 08/02/2022)   No current facility-administered medications for this visit.   ALLERGIES: Iodinated contrast media  PAST MEDICAL HISTORY: Past Medical History:  Diagnosis Date  Diabetes mellitus type 2 in nonobese (CMS-HCC)  Hyperlipemia  Hypertension   PAST SURGICAL HISTORY: Past Surgical History:  Procedure Laterality Date  BREAST EXCISIONAL BIOPSY Right  prior to 2018  CHOLECYSTECTOMY    FAMILY HISTORY: Family History  Problem Relation Age of Onset  Diabetes Mother  High blood pressure (Hypertension) Mother  Coronary Artery Disease (Blocked arteries around heart) Father  Breast cancer Sister    SOCIAL HISTORY: Social History   Socioeconomic History  Marital status: Married  Tobacco Use  Smoking status: Never  Smokeless tobacco: Never  Vaping Use  Vaping Use: Never used  Substance and Sexual Activity  Alcohol use: Never  Drug use: Never   PHYSICAL EXAM: Vitals:  08/02/22 1525  BP: 134/72  Pulse: 81   Body mass index is 23.83 kg/m. Weight: 55.3 kg (122 lb)  GENERAL: Alert, active, oriented x3  HEENT: Pupils equal reactive to light. Extraocular movements are intact. Sclera clear. Palpebral conjunctiva normal red color.Pharynx clear.  NECK: Supple with  no palpable mass and no adenopathy.  LUNGS: Sound clear with no rales rhonchi or wheezes.  HEART: Regular rhythm S1 and S2 without murmur.  BREAST: Both breasts appear normal, no suspicious masses, no skin or nipple changes or axillary nodes. Lipid palpable mass was not palpable today.  ABDOMEN: Soft and depressible, nontender with no palpable mass, no hepatomegaly.  EXTREMITIES: Well-developed well-nourished symmetrical with no dependent edema.  NEUROLOGICAL: Awake alert oriented, facial expression symmetrical, moving all extremities.  REVIEW OF DATA: I have reviewed the following data today: No visits with results within 3 Month(s) from this visit.  Latest known visit with results is:  No results found for any previous visit.  I personally evaluated the images of the ultrasound done in June that shows good reduction of the mass of the right breast. I also evaluated the last CBC that was done on 07/31/2022. This shows white blood cell count of 5.0, hemoglobin of 10.2. Her platelets are 231.  ASSESSMENT: Ms. Ferrel Logan is a 66 y.o. female presenting for evaluation for surgical management of her right breast cancer.   Patient stage IIb, cT2, N0, M0 right breast cancer. She completed 4 cycle of AC Keytruda on 07/31/2022.  On physical exam the previous palpable masses palpated anymore. Follow-up ultrasound has been showing good response with decrease of the mass from 2.1 cm to 1.5 cm. Skin does not seem to be involved on physical exam. Patient would like to consider breast conserving therapy. I discussed the care with Dr. Janese Banks and we decided to proceed with breast MRI for final evaluation of neoadjuvant chemotherapy response and to see if there is possible involvement of the skin to make final decision between partial mastectomy versus total mastectomy.  Surgical alternatives were discussed with patient including partial vs total mastectomy. Surgical technique and post operative care was discussed  with patient. Risk of surgery was discussed with patient including but not limited to: wound infection, seroma, hematoma, brachial plexopathy, mondor's disease (thrombosis of small veins of breast), chronic wound pain, breast lymphedema, altered sensation to the nipple and cosmesis among others.   MRI shows significant response to neoadjuvant chemotherapy.  Significant decrease in size of the mass.  No sign of involvement of the skin.  I discussed with daughter and patient that she is secondary for breast conserving surgery.  She would like to proceed with partial mastectomy and sentinel node biopsy  Malignant neoplasm of lower-inner quadrant of right breast of female, estrogen receptor negative (CMS-HCC) [C50.311, Z17.1]  PLAN: Right breast partial mastectomy with axillary sentinel lymph node biopsy  Herbert Pun, MD

## 2022-08-10 NOTE — H&P (View-Only) (Signed)
[name] PROFILE: Susan [name] is a 66 y.o. female who presents to [facility] for evaluation of surgical intervention for breast cancer.  PCP: [facility], [facility]  HISTORY OF PRESENT ILLNESS: Susan [name] was diagnosed with breast cancer on February 2023. 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 Preneoadjuvant chemotherapy MRI shows possible involvement of Susan skin. [name] was found with stage IIb, cT2, N0, M0 right breast cancer. [name] completed 4 cycle of AC Keytruda on 07/31/2022. She has been tolerating adequately. She had an ultrasound of Susan breast that shows decrease in size of Susan renal mass to 1.5 cm. This is a sign of good response to neoadjuvant chemotherapy.  Today Susan [name] comes for discussion of surgical alternative for her breast cancer.  PROBLEM LIST: Malignant neoplasm of Susan right breast  GENERAL REVIEW OF SYSTEMS:   General ROS: negative for - chills, fatigue, fever, weight gain or weight loss Allergy and Immunology ROS: negative for - hives  Hematological and Lymphatic ROS: negative for - bleeding problems or bruising, negative for palpable nodes Endocrine ROS: negative for - heat or cold intolerance, hair changes Respiratory ROS: negative for - cough, shortness of breath or wheezing Cardiovascular ROS: no chest pain or palpitations GI ROS: negative for nausea, vomiting, abdominal pain, diarrhea, constipation Musculoskeletal ROS: negative for - joint swelling or muscle pain Neurological ROS: negative for - confusion, syncope Dermatological ROS: negative for pruritus and rash Psychiatric: negative for anxiety, depression, difficulty sleeping and memory loss  MEDICATIONS: Current Outpatient Medications  Medication Sig Dispense Refill  atorvastatin (LIPITOR) 40 MG tablet Take 40 mg by mouth once daily  DULoxetine (CYMBALTA) 60 MG DR capsule Take by mouth once daily  enalapril (VASOTEC) 20 MG tablet Take 20 mg by mouth once daily  fenofibrate  nanocrystallized (TRICOR) 145 MG tablet Take by mouth once daily  fluticasone propionate (FLONASE) 50 mcg/actuation nasal spray as needed  gabapentin (NEURONTIN) 300 MG capsule Take by mouth 2 (two) times daily  glipiZIDE (GLUCOTROL) 2.5 MG XL tablet Take by mouth once daily  hydroCHLOROthiazide (MICROZIDE) 12.5 mg capsule Take by mouth once daily  meloxicam (MOBIC) 15 MG tablet as needed  metoprolol succinate (TOPROL-XL) 25 MG XL tablet Take 1 tablet by mouth once daily  naproxen (NAPROSYN) 500 MG tablet Take 500 mg by mouth as needed  LIDODERM 5 % patch once daily ([name] not taking: Reported on 08/02/2022)  tiZANidine (ZANAFLEX) 4 MG tablet Take 4 mg by mouth as needed ([name] not taking: Reported on 08/02/2022)   No current facility-administered medications for this visit.   ALLERGIES: Iodinated contrast media  PAST MEDICAL HISTORY: Past Medical History:  Diagnosis Date  Diabetes mellitus type 2 in nonobese (CMS-HCC)  Hyperlipemia  Hypertension   PAST SURGICAL HISTORY: Past Surgical History:  Procedure Laterality Date  BREAST EXCISIONAL BIOPSY Right  prior to 2018  CHOLECYSTECTOMY    FAMILY HISTORY: Family History  Problem Relation Age of Onset  Diabetes Mother  High blood pressure (Hypertension) Mother  Coronary Artery Disease (Blocked arteries around heart) Father  Breast cancer Sister    SOCIAL HISTORY: Social History   Socioeconomic History  Marital status: Married  Tobacco Use  Smoking status: Never  Smokeless tobacco: Never  Vaping Use  Vaping Use: Never used  Substance and Sexual Activity  Alcohol use: Never  Drug use: Never   PHYSICAL EXAM: Vitals:  08/02/22 1525  BP: 134/72  Pulse: 81   Body mass index is 23.83 kg/m. Weight: 55.3 kg (122 lb)  GENERAL: Alert, active, oriented x3  HEENT: Pupils equal reactive to light. Extraocular movements are intact. Sclera clear. Palpebral conjunctiva normal red color.Pharynx clear.  NECK: Supple with  no palpable mass and no adenopathy.  LUNGS: Sound clear with no rales rhonchi or wheezes.  HEART: Regular rhythm S1 and S2 without murmur.  BREAST: Both breasts appear normal, no suspicious masses, no skin or nipple changes or axillary nodes. Lipid palpable mass was not palpable today.  ABDOMEN: Soft and depressible, nontender with no palpable mass, no hepatomegaly.  EXTREMITIES: Well-developed well-nourished symmetrical with no dependent edema.  NEUROLOGICAL: Awake alert oriented, facial expression symmetrical, moving all extremities.  REVIEW OF DATA: I have reviewed Susan following data today: No visits with results within 3 Month(s) from this visit.  Latest known visit with results is:  No results found for any previous visit.  I personally evaluated Susan images of Susan ultrasound done in June that shows good reduction of Susan mass of Susan right breast. I also evaluated Susan last CBC that was done on 07/31/2022. This shows white blood cell count of 5.0, hemoglobin of 10.2. Her platelets are 231.  ASSESSMENT: Susan [name] is a 66 y.o. female presenting for evaluation for surgical management of her right breast cancer.   [name] stage IIb, cT2, N0, M0 right breast cancer. She completed 4 cycle of AC Keytruda on 07/31/2022.  On physical exam Susan previous palpable masses palpated anymore. Follow-up ultrasound has been showing good response with decrease of Susan mass from 2.1 cm to 1.5 cm. Skin does not seem to be involved on physical exam. [name] would like to consider breast conserving therapy. I discussed Susan care with Dr. Janese Banks and we decided to proceed with breast MRI for final evaluation of neoadjuvant chemotherapy response and to see if there is possible involvement of Susan skin to make final decision between partial mastectomy versus total mastectomy.  Surgical alternatives were discussed with [name] including partial vs total mastectomy. Surgical technique and post operative care was discussed  with [name]. Risk of surgery was discussed with [name] including but not limited to: wound infection, seroma, hematoma, brachial plexopathy, mondor's disease (thrombosis of small veins of breast), chronic wound pain, breast lymphedema, altered sensation to Susan nipple and cosmesis among others.   MRI shows significant response to neoadjuvant chemotherapy.  Significant decrease in size of Susan mass.  No sign of involvement of Susan skin.  I discussed with Susan [name] and [name] that she is secondary for breast conserving surgery.  She would like to proceed with partial mastectomy and sentinel node biopsy  Malignant neoplasm of lower-inner quadrant of right breast of female, estrogen receptor negative (CMS-HCC) [C50.311, Z17.1]  PLAN: Right breast partial mastectomy with axillary sentinel lymph node biopsy  Herbert Pun, MD

## 2022-08-11 ENCOUNTER — Other Ambulatory Visit: Payer: Self-pay

## 2022-08-14 ENCOUNTER — Encounter
Admission: RE | Admit: 2022-08-14 | Discharge: 2022-08-14 | Disposition: A | Discharge status: Home / Self Care | Payer: Medicare HMO | Source: Ambulatory Visit | Attending: General Surgery | Admitting: General Surgery

## 2022-08-14 DIAGNOSIS — Z01818 Encounter for other preprocedural examination: Secondary | ICD-10-CM

## 2022-08-14 HISTORY — DX: Deficiency of other specified B group vitamins: E53.8

## 2022-08-14 HISTORY — DX: Personal history of antineoplastic chemotherapy: Z92.21

## 2022-08-14 HISTORY — DX: Presence of other vascular implants and grafts: Z95.828

## 2022-08-14 NOTE — Patient Instructions (Addendum)
Your procedure is scheduled on:08-16-22 Wednesday Report to the Registration Desk on the 1st floor of the [facility]. Then proceed to the Radiology Desk (2nd desk on right). Arrive at 7:30 am  REMEMBER: Instructions that are not followed completely may result in serious medical risk, up to and including death; or upon the discretion of your surgeon and anesthesiologist your surgery may need to be rescheduled.  Do not eat food OR drink any liquids after midnight the night before surgery.  No gum chewing, lozengers or hard candies.  TAKE THESE MEDICATIONS THE MORNING OF SURGERY WITH A SIP OF WATER: -fenofibrate (TRICOR) -gabapentin (NEURONTIN)  One week prior to surgery: Stop Anti-inflammatories (NSAIDS) such as Advil, Aleve, Ibuprofen, Motrin, Naproxen, Naprosyn and Aspirin based products such as Excedrin, Goodys Powder, BC Powder.You may however, take Tylenol if needed for pain up until the day of surgery.  Stop ANY OVER THE COUNTER supplements/vitamins NOW (08-14-22) until after surgery (Vitamin C and Folic Acid)  No Alcohol for 24 hours before or after surgery.  No Smoking including e-cigarettes for 24 hours prior to surgery.  No chewable tobacco products for at least 6 hours prior to surgery.  No nicotine patches on the day of surgery.  Do not use any "recreational" drugs for at least a week prior to your surgery.  Please be advised that the combination of cocaine and anesthesia may have negative outcomes, up to and including death. If you test positive for cocaine, your surgery will be cancelled.  On the morning of surgery brush your teeth with toothpaste and water, you may rinse your mouth with mouthwash if you wish. Do not swallow any toothpaste or mouthwash.  Use CHG Soap as directed on instruction sheet.  Do not wear jewelry, make-up, hairpins, clips or nail polish.  Do not wear lotions, powders, or perfumes.   Do not shave body from the neck down 48 hours prior to  surgery just in case you cut yourself which could leave a site for infection.  Also, freshly shaved skin may become irritated if using the CHG soap.  Contact lenses, hearing aids and dentures may not be worn into surgery.  Do not bring valuables to the hospital. [facility] is not responsible for any missing/lost belongings or valuables.   Notify your doctor if there is any change in your medical condition (cold, fever, infection).  Wear comfortable clothing (specific to your surgery type) to the hospital.  After surgery, you can help prevent lung complications by doing breathing exercises.  Take deep breaths and cough every 1-2 hours. Your doctor may order a device called an Incentive Spirometer to help you take deep breaths. When coughing or sneezing, hold a pillow firmly against your incision with both hands. This is called "splinting." Doing this helps protect your incision. It also decreases belly discomfort.  If you are being admitted to the hospital overnight, leave your suitcase in the car. After surgery it may be brought to your room.  If you are being discharged the day of surgery, you will not be allowed to drive home. You will need a responsible adult (18 years or older) to drive you home and stay with you that night.   If you are taking public transportation, you will need to have a responsible adult (18 years or older) with you. Please confirm with your physician that it is acceptable to use public transportation.   Please call the [facility] at [phone] if you have any questions about  these instructions.  Surgery Visitation Policy:  Patients undergoing a surgery or procedure may have two family members or support persons with them as long as the person is not COVID-19 positive or experiencing its symptoms.    How to Use Chlorhexidine Before Surgery Chlorhexidine gluconate (CHG) is a germ-killing (antiseptic) solution that is used to clean the  skin. It can get rid of the bacteria that normally live on the skin and can keep them away for about 24 hours. To clean your skin with CHG, you may be given: A CHG solution to use in the shower or as part of a sponge bath. A prepackaged cloth that contains CHG. Cleaning your skin with CHG may help lower the risk for infection: While you are staying in the intensive care unit of the hospital. If you have a vascular access, such as a central line, to provide short-term or long-term access to your veins. If you have a catheter to drain urine from your bladder. If you are on a ventilator. A ventilator is a machine that helps you breathe by moving air in and out of your lungs. After surgery. What are the risks? Risks of using CHG include: A skin reaction. Hearing loss, if CHG gets in your ears and you have a perforated eardrum. Eye injury, if CHG gets in your eyes and is not rinsed out. The CHG product catching fire. Make sure that you avoid smoking and flames after applying CHG to your skin. Do not use CHG: If you have a chlorhexidine allergy or have previously reacted to chlorhexidine. On babies younger than 46 months of age. How to use CHG solution Use CHG only as told by your health care provider, and follow the instructions on the label. Use the full amount of CHG as directed. Usually, this is one bottle. During a shower Follow these steps when using CHG solution during a shower (unless your health care provider gives you different instructions): Start the shower. Use your normal soap and shampoo to wash your face and hair. Turn off the shower or move out of the shower stream. Pour the CHG onto a clean washcloth. Do not use any type of brush or rough-edged sponge. Starting at your neck, lather your body down to your toes. Make sure you follow these instructions: If you will be having surgery, pay special attention to the part of your body where you will be having surgery. Scrub this area  for at least 1 minute. Do not use CHG on your head or face. If the solution gets into your ears or eyes, rinse them well with water. Avoid your genital area. Avoid any areas of skin that have broken skin, cuts, or scrapes. Scrub your back and under your arms. Make sure to wash skin folds. Let the lather sit on your skin for 1-2 minutes or as long as told by your health care provider. Thoroughly rinse your entire body in the shower. Make sure that all body creases and crevices are rinsed well. Dry off with a clean towel. Do not put any substances on your body afterward--such as powder, lotion, or perfume--unless you are told to do so by your health care provider. Only use lotions that are recommended by the manufacturer. Put on clean clothes or pajamas. 10. If it is the night before your surgery, sleep in clean sheets.   Su procedimiento est programado para el: 08-16-22 mircoles Presntese en el mostrador de Aeronautical engineer del [employer]. Luego dirjase al  mostrador de IT consultant (segundo escritorio a Industrial/product designer). Llegada a las 7:30 am  RECORDAR: Las instrucciones que no se siguen completamente pueden provocar riesgos mdicos graves, que pueden llegar hasta la [place]; o, segn el criterio de su cirujano y Environmental health practitioner, es posible que sea Firefighter su Leisure centre manager.  No coma ni beba lquidos despus de la medianoche del da anterior a la ciruga. No mascar chicle, pastillas ni caramelos duros.  TOMA ESTOS MEDICAMENTOS LA MAANA DE LA CIRUGA CON UN sorbo de agua: -fenofibrato (TRICOR) -gabapentina (NEURONTIN)  Una semana antes de la ciruga: Deje de tomar antiinflamatorios (AINE) como Advil, Aleve, Ibuprofeno, Motrin, Naproxen, Naprosyn y productos a base de aspirina como Excedrin, Goodys Powder, [employer]. Sin embargo, puede tomar Tylenol si es necesario para Audiological scientist de la [place]. Natasha Mead de CUALQUIER suplemento/vitamina de venta libre AHORA (08-14-22)  hasta despus de la ciruga (vitamina C y cido flico)  No consumir alcohol durante 24 horas antes o despus de la [place].  No fumar, incluidos los cigarrillos electrnicos, durante las 24 horas previas a la [place]. No consumir productos de tabaco masticables durante al menos 6 horas antes de la [place]. Sin parches de Special educational needs teacher de la [place].  No utilice ninguna droga "recreativa" durante al menos una semana antes de la [place]. Tenga en cuenta que la combinacin de cocana y anestesia puede [employer], que pueden llegar hasta la [place]. Si su prueba de cocana da positivo, su ciruga ser cancelada.  La maana de la ciruga cepille sus dientes con pasta dental y agua, puede enjuagarse la boca con enjuague bucal si lo desea. No ingiera pasta de dientes ni enjuague bucal.  Utilice el jabn CHG como se indica en la hoja de instrucciones.  No use joyas, maquillaje, horquillas, clips ni esmalte de uas.  No use lociones, polvos ni perfumes.  No se afeite el cuerpo desde el cuello hacia abajo 48 horas antes de la ciruga en caso de que se corte, lo que podra dejar un sitio de infeccin. Adems, la piel recin afeitada puede irritarse si se utiliza el jabn CHG.  No se pueden usar lentes de contacto, audfonos ni dentaduras postizas durante la [place].  No lleve objetos de valor al hospital. [facility] no es responsable de ninguna pertenencia u objeto de valor perdido o perdido.  Notifique a su mdico si hay algn cambio en su condicin mdica (resfriado, fiebre, infeccin).  Lleve ropa cmoda (especfica para su tipo de [place]) al hospital.  Despus de la ciruga, usted puede ayudar a prevenir complicaciones pulmonares haciendo ejercicios de respiracin. Respire profundamente y tosa cada 1 o 2 horas. Su mdico puede indicarle un dispositivo llamado espirmetro incentivador para ayudarle a respirar profundamente. Al toser o Brewing technologist, sostenga firmemente una  almohada contra la incisin con ambas manos. Esto se llama "ferulizacin". Hacer esto ayuda a proteger su incisin. Robinson molestias abdominales.  Si vas a pasar la noche en el hospital, deja tu maleta en el coche. Despus de la [place], es posible que lo lleven a su habitacin.  Si le dan el alta el da de la [place], no se le permitir conducir a casa. Necesitar que un adulto responsable (de 18 aos o ms) lo lleve a casa y se quede con usted esa noche.  Si viaja en transporte pblico, deber ir acompaado de un adulto responsable (de 18 aos o ms). Confirme con su mdico que es aceptable utilizar el transporte pblico.  Llame  al Departamento de pruebas previas a la admisin al [phone] si tiene alguna pregunta sobre estas instrucciones.  Poltica de visitas a ciruga:  [employer] se someten a [place] o procedimiento pueden [employer] familiares o personas de apoyo con ellos, siempre y cuando la persona no sea positiva para COVID-19 ni experimente sus sntomas.   Cmo utilizar clorhexidina antes de la ciruga El gluconato de clorhexidina (CHG) es una solucin antisptica que mata grmenes y que se [place] para limpiar la piel. Puede eliminar las bacterias que normalmente viven en la piel y mantenerlas alejadas durante aproximadamente 24 horas. Para limpiar su piel con CHG, es posible que le administren:  Una solucin de CHG para usar en la ducha o como parte de un bao de [place].  Un pao empaquetado que contiene CHG. Limpiar su piel con CHG puede ayudar a reducir el riesgo de infeccin:  Mientras permanezca en la unidad de cuidados intensivos del hospital.  Si tiene un acceso vascular, como una va central, para proporcionar acceso a sus venas a corto o Barrister's clerk.  Si tiene un catter para drenar la orina de su vejiga.  Si est conectado a Programmer, systems. Un ventilador es [employer] ayuda a respirar haciendo Dietitian y Forensic scientist aire de los  pulmones.  Despus de [place]. Cules son los riesgos? Los riesgos del uso de CHG incluyen:  Una reaccin cutnea.  Prdida de audicin, si CHG entra en sus odos y tiene el tmpano perforado.  Lesin en los ojos, si el CHG entra en contacto con los ojos y no se enjuaga.  El producto CHG se incendia. Asegrese de Product/process development scientist fumar y las llamas despus de aplicar CHG en la piel. No utilice CHG:  Si tiene alergia a la clorhexidina o ha reaccionado previamente a la clorhexidina.  En bebs menores de 2 meses de edad. Cmo utilizar CHG Look up details Send feedback Side panels 5,000 character limit. Use the arrows to translate more.

## 2022-08-14 NOTE — Pre-Procedure Instructions (Signed)
Anesthesia interview done over the phone with interpreter Estill Bamberg

## 2022-08-15 ENCOUNTER — Other Ambulatory Visit: Payer: Self-pay

## 2022-08-15 ENCOUNTER — Ambulatory Visit
Admission: RE | Admit: 2022-08-15 | Discharge: 2022-08-15 | Disposition: A | Discharge status: Home / Self Care | Payer: Medicare HMO | Source: Ambulatory Visit | Attending: General Surgery | Admitting: General Surgery

## 2022-08-15 DIAGNOSIS — C50311 Malignant neoplasm of lower-inner quadrant of right female breast: Secondary | ICD-10-CM | POA: Diagnosis not present

## 2022-08-15 DIAGNOSIS — Z170 Estrogen receptor positive status [ER+]: Secondary | ICD-10-CM | POA: Diagnosis present

## 2022-08-15 MED ORDER — ORAL CARE MOUTH RINSE: 15.0000 mL | Freq: Once | OROMUCOSAL | Status: AC

## 2022-08-15 MED ORDER — FAMOTIDINE 20 MG PO TABS: 20.0000 mg | ORAL_TABLET | Freq: Once | ORAL | Status: AC

## 2022-08-15 MED ORDER — CHLORHEXIDINE GLUCONATE 0.12 % MT SOLN: 15.0000 mL | Freq: Once | OROMUCOSAL | Status: AC

## 2022-08-15 MED ORDER — SODIUM CHLORIDE 0.9 % IV SOLN: INTRAVENOUS | Status: DC

## 2022-08-15 MED ORDER — CEFAZOLIN SODIUM-DEXTROSE 2-4 GM/100ML-% IV SOLN
2.0000 g | INTRAVENOUS | Status: AC
  Administered 2022-08-16: 2 g via INTRAVENOUS

## 2022-08-16 ENCOUNTER — Encounter: Payer: Self-pay | Admitting: General Surgery

## 2022-08-16 ENCOUNTER — Encounter
Admission: RE | Disposition: A | Discharge status: Home / Self Care | Payer: Self-pay | Source: Home / Self Care | Attending: General Surgery

## 2022-08-16 ENCOUNTER — Other Ambulatory Visit: Payer: Self-pay

## 2022-08-16 ENCOUNTER — Ambulatory Visit
Admission: RE | Admit: 2022-08-16 | Discharge: 2022-08-16 | Disposition: A | Discharge status: Home / Self Care | Payer: Medicare HMO | Source: Ambulatory Visit | Attending: General Surgery | Admitting: General Surgery

## 2022-08-16 ENCOUNTER — Ambulatory Visit: Payer: Medicare HMO | Admitting: Anesthesiology

## 2022-08-16 ENCOUNTER — Ambulatory Visit
Admission: RE | Admit: 2022-08-16 | Discharge: 2022-08-16 | Disposition: A | Discharge status: Home / Self Care | Payer: Medicare HMO | Attending: General Surgery | Admitting: General Surgery

## 2022-08-16 DIAGNOSIS — Z171 Estrogen receptor negative status [ER-]: Secondary | ICD-10-CM | POA: Diagnosis not present

## 2022-08-16 DIAGNOSIS — C50311 Malignant neoplasm of lower-inner quadrant of right female breast: Secondary | ICD-10-CM | POA: Insufficient documentation

## 2022-08-16 DIAGNOSIS — E119 Type 2 diabetes mellitus without complications: Secondary | ICD-10-CM | POA: Insufficient documentation

## 2022-08-16 DIAGNOSIS — Z01818 Encounter for other preprocedural examination: Secondary | ICD-10-CM

## 2022-08-16 DIAGNOSIS — Z8669 Personal history of other diseases of the nervous system and sense organs: Secondary | ICD-10-CM | POA: Insufficient documentation

## 2022-08-16 DIAGNOSIS — Z7984 Long term (current) use of oral hypoglycemic drugs: Secondary | ICD-10-CM | POA: Insufficient documentation

## 2022-08-16 DIAGNOSIS — I10 Essential (primary) hypertension: Secondary | ICD-10-CM | POA: Insufficient documentation

## 2022-08-16 HISTORY — PX: PARTIAL MASTECTOMY WITH AXILLARY SENTINEL LYMPH NODE BIOPSY: SHX6004

## 2022-08-16 LAB — GLUCOSE, CAPILLARY
Glucose-Capillary: 147 mg/dL — ABNORMAL HIGH (ref 70–99)
Glucose-Capillary: 162 mg/dL — ABNORMAL HIGH (ref 70–99)

## 2022-08-16 SURGERY — PARTIAL MASTECTOMY WITH AXILLARY SENTINEL LYMPH NODE BIOPSY
Anesthesia: General | Site: Breast | Laterality: Right

## 2022-08-16 MED ORDER — PHENYLEPHRINE 80 MCG/ML (10ML) SYRINGE FOR IV PUSH (FOR BLOOD PRESSURE SUPPORT)
PREFILLED_SYRINGE | INTRAVENOUS | Status: DC | PRN
  Administered 2022-08-16: 160 ug via INTRAVENOUS
  Administered 2022-08-16: 80 ug via INTRAVENOUS

## 2022-08-16 MED ORDER — PROPOFOL 10 MG/ML IV BOLUS
INTRAVENOUS | Status: DC | PRN
  Administered 2022-08-16: 100 mg via INTRAVENOUS

## 2022-08-16 MED ORDER — FENTANYL CITRATE (PF) 100 MCG/2ML IJ SOLN
INTRAMUSCULAR | Status: AC
  Filled 2022-08-16: qty 2

## 2022-08-16 MED ORDER — ACETAMINOPHEN 10 MG/ML IV SOLN
1000.0000 mg | Freq: Once | INTRAVENOUS | Status: DC | PRN
  Administered 2022-08-16: 1000 mg via INTRAVENOUS

## 2022-08-16 MED ORDER — BUPIVACAINE-EPINEPHRINE (PF) 0.5% -1:200000 IJ SOLN
INTRAMUSCULAR | Status: DC | PRN
  Administered 2022-08-16: 30 mL

## 2022-08-16 MED ORDER — METHYLENE BLUE 1 % INJ SOLN
INTRAVENOUS | Status: DC | PRN
  Administered 2022-08-16: 10 mL via INTRADERMAL

## 2022-08-16 MED ORDER — TRAMADOL HCL 50 MG PO TABS: 50.0000 mg | ORAL_TABLET | Freq: Four times a day (QID) | ORAL | 0 refills | Status: DC | PRN

## 2022-08-16 MED ORDER — TECHNETIUM TC 99M TILMANOCEPT KIT
1.0000 | PACK | Freq: Once | INTRAVENOUS | Status: AC | PRN
  Administered 2022-08-16: 1.06 via INTRADERMAL

## 2022-08-16 MED ORDER — CEFAZOLIN SODIUM-DEXTROSE 2-4 GM/100ML-% IV SOLN
INTRAVENOUS | Status: AC
  Filled 2022-08-16: qty 100

## 2022-08-16 MED ORDER — METHYLENE BLUE 1 % INJ SOLN
INTRAVENOUS | Status: AC
  Filled 2022-08-16: qty 10

## 2022-08-16 MED ORDER — DEXAMETHASONE SODIUM PHOSPHATE 10 MG/ML IJ SOLN
INTRAMUSCULAR | Status: DC | PRN
  Administered 2022-08-16: 8 mg via INTRAVENOUS

## 2022-08-16 MED ORDER — FAMOTIDINE 20 MG PO TABS
ORAL_TABLET | ORAL | Status: AC
  Administered 2022-08-16: 20 mg via ORAL
  Filled 2022-08-16: qty 1

## 2022-08-16 MED ORDER — PROPOFOL 10 MG/ML IV BOLUS
INTRAVENOUS | Status: AC
  Filled 2022-08-16: qty 20

## 2022-08-16 MED ORDER — LIDOCAINE HCL (CARDIAC) PF 100 MG/5ML IV SOSY
PREFILLED_SYRINGE | INTRAVENOUS | Status: DC | PRN
  Administered 2022-08-16: 80 mg via INTRAVENOUS

## 2022-08-16 MED ORDER — ONDANSETRON HCL 4 MG/2ML IJ SOLN: 4.0000 mg | Freq: Once | INTRAMUSCULAR | Status: DC | PRN

## 2022-08-16 MED ORDER — BUPIVACAINE-EPINEPHRINE (PF) 0.5% -1:200000 IJ SOLN
INTRAMUSCULAR | Status: AC
  Filled 2022-08-16: qty 30

## 2022-08-16 MED ORDER — OXYCODONE HCL 5 MG PO TABS
5.0000 mg | ORAL_TABLET | Freq: Once | ORAL | Status: AC | PRN
  Administered 2022-08-16: 5 mg via ORAL

## 2022-08-16 MED ORDER — LIDOCAINE HCL (PF) 2 % IJ SOLN
INTRAMUSCULAR | Status: AC
  Filled 2022-08-16: qty 5

## 2022-08-16 MED ORDER — OXYCODONE HCL 5 MG/5ML PO SOLN: 5.0000 mg | Freq: Once | ORAL | Status: AC | PRN

## 2022-08-16 MED ORDER — ACETAMINOPHEN 10 MG/ML IV SOLN
INTRAVENOUS | Status: AC
  Filled 2022-08-16: qty 100

## 2022-08-16 MED ORDER — DIPHENHYDRAMINE HCL 50 MG/ML IJ SOLN
INTRAMUSCULAR | Status: DC | PRN
  Administered 2022-08-16: 25 mg via INTRAVENOUS

## 2022-08-16 MED ORDER — LACTATED RINGERS IV SOLN: INTRAVENOUS | Status: DC | PRN

## 2022-08-16 MED ORDER — FENTANYL CITRATE (PF) 100 MCG/2ML IJ SOLN
INTRAMUSCULAR | Status: DC | PRN
  Administered 2022-08-16 (×4): 25 ug via INTRAVENOUS

## 2022-08-16 MED ORDER — ONDANSETRON HCL 4 MG/2ML IJ SOLN
INTRAMUSCULAR | Status: DC | PRN
  Administered 2022-08-16: 4 mg via INTRAVENOUS

## 2022-08-16 MED ORDER — LACTATED RINGERS IV SOLN: INTRAVENOUS | Status: DC

## 2022-08-16 MED ORDER — FENTANYL CITRATE (PF) 100 MCG/2ML IJ SOLN: 25.0000 ug | INTRAMUSCULAR | Status: DC | PRN

## 2022-08-16 MED ORDER — CHLORHEXIDINE GLUCONATE 0.12 % MT SOLN
OROMUCOSAL | Status: AC
  Administered 2022-08-16: 15 mL via OROMUCOSAL
  Filled 2022-08-16: qty 15

## 2022-08-16 SURGICAL SUPPLY — 40 items
BINDER UNISEX L 56X13 (GAUZE/BANDAGES/DRESSINGS) IMPLANT
BLADE SURG 15 STRL LF DISP TIS (BLADE) ×2 IMPLANT
BLADE SURG 15 STRL SS (BLADE) ×2
CHLORAPREP W/TINT 26 (MISCELLANEOUS) ×1 IMPLANT
COVER PROBE GAMMA FINDER SLV (MISCELLANEOUS) ×1 IMPLANT
DERMABOND ADVANCED .7 DNX12 (GAUZE/BANDAGES/DRESSINGS) ×1 IMPLANT
DEVICE DUBIN SPECIMEN MAMMOGRA (MISCELLANEOUS) ×1 IMPLANT
DRAPE LAPAROTOMY TRNSV 106X77 (MISCELLANEOUS) ×1 IMPLANT
ELECT CAUTERY BLADE 6.4 (BLADE) ×1 IMPLANT
ELECT REM PT RETURN 9FT ADLT (ELECTROSURGICAL) ×1
ELECTRODE REM PT RTRN 9FT ADLT (ELECTROSURGICAL) ×1 IMPLANT
GAUZE 4X4 16PLY ~~LOC~~+RFID DBL (SPONGE) ×1 IMPLANT
GLOVE BIO SURGEON STRL SZ 6.5 (GLOVE) ×1 IMPLANT
GLOVE BIOGEL PI IND STRL 6.5 (GLOVE) ×1 IMPLANT
GOWN STRL REUS W/ TWL LRG LVL3 (GOWN DISPOSABLE) ×2 IMPLANT
GOWN STRL REUS W/TWL LRG LVL3 (GOWN DISPOSABLE) ×2
KIT TURNOVER KIT A (KITS) ×1 IMPLANT
LABEL OR SOLS (LABEL) ×1 IMPLANT
MANIFOLD NEPTUNE II (INSTRUMENTS) ×1 IMPLANT
MARGIN MAP 10MM (MISCELLANEOUS) ×1 IMPLANT
NDL HYPO 25X1 1.5 SAFETY (NEEDLE) ×1 IMPLANT
NEEDLE HYPO 22GX1.5 SAFETY (NEEDLE) ×1 IMPLANT
NEEDLE HYPO 25X1 1.5 SAFETY (NEEDLE) ×1 IMPLANT
PACK BASIN MINOR ARMC (MISCELLANEOUS) ×1 IMPLANT
SUT ETHILON 3-0 FS-10 30 BLK (SUTURE) ×1
SUT MNCRL 4-0 (SUTURE) ×2
SUT MNCRL 4-0 27XMFL (SUTURE) ×2
SUT SILK 2 0 SH (SUTURE) ×1 IMPLANT
SUT VIC AB 2-0 SH 27 (SUTURE) ×1
SUT VIC AB 2-0 SH 27XBRD (SUTURE) IMPLANT
SUT VIC AB 3-0 SH 27 (SUTURE) ×2
SUT VIC AB 3-0 SH 27X BRD (SUTURE) ×2 IMPLANT
SUTURE EHLN 3-0 FS-10 30 BLK (SUTURE) ×1 IMPLANT
SUTURE MNCRL 4-0 27XMF (SUTURE) ×2 IMPLANT
SYR 10ML LL (SYRINGE) ×2 IMPLANT
SYR BULB IRRIG 60ML STRL (SYRINGE) ×1 IMPLANT
TRAP FLUID SMOKE EVACUATOR (MISCELLANEOUS) ×1 IMPLANT
TRAP NEPTUNE SPECIMEN COLLECT (MISCELLANEOUS) ×1 IMPLANT
WATER STERILE IRR 1000ML POUR (IV SOLUTION) ×1 IMPLANT
WATER STERILE IRR 500ML POUR (IV SOLUTION) ×1 IMPLANT

## 2022-08-16 NOTE — Interval H&P Note (Signed)
History and Physical Interval Note:  08/16/2022 8:33 AM  Susan [name]  has presented today for surgery, with the diagnosis of C50.311, Z17.1 Mallignant neoplasm of lower-inner quadrant of rt breast in female, estrogen receptor negative.  The various methods of treatment have been discussed with the patient and family. After consideration of risks, benefits and other options for treatment, the patient has consented to  Procedure(s): PARTIAL MASTECTOMY WITH AXILLARY SENTINEL LYMPH NODE BIOPSY -- RF guided (Right) as a surgical intervention.  The patient's history has been reviewed, patient examined, no change in status, stable for surgery.  I have reviewed the patient's chart and labs.  Questions were answered to the patient's satisfaction.     Herbert Pun

## 2022-08-16 NOTE — Anesthesia Preprocedure Evaluation (Addendum)
Anesthesia Evaluation  Patient identified by MRN, date of birth, ID band Patient awake    Reviewed: Allergy & Precautions, NPO status , Patient's Chart, lab work & pertinent test results  History of Anesthesia Complications Negative for: history of anesthetic complications  Airway Mallampati: II   Neck ROM: Full    Dental  (+) Missing   Pulmonary neg pulmonary ROS,    Pulmonary exam normal breath sounds clear to auscultation       Cardiovascular hypertension, Normal cardiovascular exam Rhythm:Regular Rate:Normal  ECG 03/02/22: normal   Neuro/Psych Seizures - (last sz 21 years ago),  negative psych ROS   GI/Hepatic negative GI ROS,   Endo/Other  diabetes, Poorly Controlled, Type 2  Renal/GU negative Renal ROS     Musculoskeletal   Abdominal   Peds  Hematology Breast CA   Anesthesia Other Findings   Reproductive/Obstetrics                            Anesthesia Physical Anesthesia Plan  ASA: 3  Anesthesia Plan: General   Post-op Pain Management:    Induction: Intravenous  PONV Risk Score and Plan: 3 and Ondansetron, Dexamethasone and Treatment may vary due to age or medical condition  Airway Management Planned: LMA  Additional Equipment:   Intra-op Plan:   Post-operative Plan: Extubation in OR  Informed Consent: I have reviewed the patients History and Physical, chart, labs and discussed the procedure including the risks, benefits and alternatives for the proposed anesthesia with the patient or authorized representative who has indicated his/her understanding and acceptance.     Dental advisory given  Plan Discussed with: CRNA  Anesthesia Plan Comments: (History and consent obtained in Spanish.  Patient consented for risks of anesthesia including but not limited to:  - adverse reactions to medications - damage to eyes, teeth, lips or other oral mucosa - nerve damage due to  positioning  - sore throat or hoarseness - damage to heart, brain, nerves, lungs, other parts of body or loss of life  Informed patient about role of CRNA in peri- and intra-operative care.  Patient voiced understanding.)       Anesthesia Quick Evaluation

## 2022-08-16 NOTE — Op Note (Signed)
Preoperative diagnosis: Right breast carcinoma.  Postoperative diagnosis: Same.   Procedure: Right radiofrequency tag-localized partial mastectomy.                      Right Axillary Sentinel Lymph node biopsy  Anesthesia: GETA  Surgeon: Dr. Windell Moment  Wound Classification: Clean  Indications: Patient is a 66 y.o. 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 She completed neoadjuvant chemotherapy and showed good response on pre operative MRI. Requires radiofrequency tag-localized partial mastectomy for treatment with sentinel lymph node biopsy.   Findings: 1. Specimen mammography shows marker and tag on specimen 2. Pathology call refers gross examination of margins was grossly clear 3. No other palpable mass or lymph node identified.   Description of procedure: Preoperative radiofrequency tag localization was performed by radiology. In the nuclear medicine suite, the subareolar region was injected with Tc-99 sulfur colloid. Localization studies were reviewed. The patient was taken to the operating room and placed supine on the operating table, and after general anesthesia the right chest and axilla were prepped and draped in the usual sterile fashion. A time-out was completed verifying correct patient, procedure, site, positioning, and implant(s) and/or special equipment prior to beginning this procedure.  I personally injected methylene blue around the areola for intraoperative identification of sentinel nodes. By comparing the localization studies and interrogation with Localizer device, the probable trajectory and location of the mass was visualized. A circumareolar skin incision was planned in such a way as to minimize the amount of dissection to reach the mass.  The skin incision was made. Flaps were raised and the location of the tag was confirmed with Localizer device confirmed. A 2-0 silk  figure-of-eight stay suture was placed and used for retraction. Dissection was then taken down circumferentially, taking care to include the entire localizing tag and a wide margin of grossly normal tissue. The specimen and entire localizing tag were removed. The specimen was oriented and sent to radiology with the localization studies. Confirmation was received that the entire target lesion had been resected. The wound was irrigated. Hemostasis was checked.    A hand-held gamma probe was used to identify the location of the hottest spot in the axilla. An incision was made around the caudal axillary hairline. Dissection was carried down until subdermal facias was advanced. The probe was placed and again, the point of maximal count was found. Dissection continue until nodule was identified. The probe was placed in contact with the node. The node was excised in its entirety.  Two additional hot spot was detected and the node was excised in similar fashion. No clinically abnormal nodes were palpated. The procedure was terminated. Hemostasis was achieved and the wound closed in layers with deep interrupted 3-0 Vicryl and skin was closed with subcuticular suture of Monocryl 3-0.   The breast wound was then approached for closure.  Eliminate dead space a tissue transfer technique was utilized.  The breast and pectoralis fascia was elevated off the underlying muscle and the serratus muscle circumferentially for a distance of about 5 centimeters for a 25 cm area.  The fascial layer was then approximated with interrupted 2-0 Vicryl sutures.  The superficial layer of the breast parenchyma was then approximated in a similar fashion.  This was done in a radial direction.  The skin flaps were then elevated circumferentially to remove a ripple noted superiorly and medially. The skin was closed with 4-0  Monocryl. Dermabond was applied.  The patient tolerated the procedure well and was taken to the postanesthesia care unit  in stable condition.   Sentinel Node Biopsy Synoptic Operative Report  Operation performed with curative intent:Yes  Tracer(s) used to identify sentinel nodes in the upfront surgery (non-neoadjuvant) setting (select all that apply):N/A  Tracer(s) used to identify sentinel nodes in the neoadjuvant setting (select all that apply):Dye and Radioactive Tracer  All nodes (colored or non-colored) present at the end of a dye-filled lymphatic channel were removed:Yes   All significantly radioactive nodes were removed:Yes  All palpable suspicious nodes were removed:N/A  Biopsy-proven positive nodes marked with clips prior to chemotherapy were identified and removed:N/A   Specimen: Right Breast mass                     Sentinel Lymph nodes #1, #2, #3  Complications: None  Estimated Blood Loss: 5 mL

## 2022-08-16 NOTE — Anesthesia Postprocedure Evaluation (Signed)
Anesthesia Post Note  Patient: Susan [name]  Procedure(s) Performed: PARTIAL MASTECTOMY WITH AXILLARY SENTINEL LYMPH NODE BIOPSY -- RF guided (Right: Breast)  Patient location during evaluation: PACU Anesthesia Type: General Level of consciousness: awake and alert, oriented and patient cooperative Pain management: pain level controlled Vital Signs Assessment: post-procedure vital signs reviewed and stable Respiratory status: spontaneous breathing, nonlabored ventilation and respiratory function stable Cardiovascular status: blood pressure returned to baseline and stable Postop Assessment: adequate PO intake Anesthetic complications: no   No notable events documented.   Last Vitals:  Vitals:   08/16/22 1115 08/16/22 1130  BP: 136/64 135/61  Pulse: 80 83  Resp: 17 18  Temp:  37 C  SpO2: 96% 94%    Last Pain:  Vitals:   08/16/22 1115  TempSrc:   PainSc: [place]

## 2022-08-16 NOTE — Transfer of Care (Signed)
Immediate Anesthesia Transfer of Care Note  Patient: Susan [name]  Procedure(s) Performed: PARTIAL MASTECTOMY WITH AXILLARY SENTINEL LYMPH NODE BIOPSY -- RF guided (Right: Breast)  Patient Location: PACU  Anesthesia Type:General  Level of Consciousness: drowsy and responds to stimulation  Airway & Oxygen Therapy: Patient Spontanous Breathing and Patient connected to face mask oxygen  Post-op Assessment: Report given to RN and Post -op Vital signs reviewed and stable  Post vital signs: Reviewed and stable  Last Vitals:  Vitals Value Taken Time  BP    Temp    Pulse    Resp    SpO2      Last Pain:  Vitals:   08/16/22 0830  TempSrc: Temporal  PainSc: 0-No pain         Complications: No notable events documented.

## 2022-08-16 NOTE — Discharge Instructions (Addendum)
  Diet: Resume home heart healthy regular diet.   Activity: No heavy lifting >20 pounds (children, pets, laundry, garbage) or strenuous activity until follow-up, but light activity and walking are encouraged. Do not drive or drink alcohol if taking narcotic pain medications.  Wound care: May shower with soapy water and pat dry (do not rub incisions), but no baths or submerging incision underwater until follow-up. (no swimming)   Medications: Resume all home medications. For mild to moderate pain: acetaminophen (Tylenol) or ibuprofen (if no kidney disease). Combining Tylenol with alcohol can substantially increase your risk of causing liver disease. Narcotic pain medications, if prescribed, can be used for severe pain, though may cause nausea, constipation, and drowsiness. If you do not need the narcotic pain medication, you do not need to fill the prescription.  Call office [phone]) at any time if any questions, worsening pain, fevers/chills, bleeding, drainage from incision site, or other concerns.      CIRUGIA AMBULATORIA       Instruccionnes de alta      1.  Las drogas que se Statistician en su cuerpo [employer], asi            que por las proximas 24 horas usted no debe:   Conducir Scientist, research (medical)) un automovil   Hacer ninguna decision legal   Tomar ninguna bebida alcoholica  2.  A) [place] puede comenzar una dieta regular.  Es mejor que hoy empiece con           liquidos y gradualmente anada comidas solidas.       B) Puede comer cualquier comida que desee pero es mejor empezar con liquidos,                      luego sopitas con galletas saladas y gradualmente llegar a las comidas solidas.  3.  Por favor avise a su medico inmediatamente si usted tiene algun sangrado anormal,       tiene dificultad con la respiracion, enrojecimiento y Social research officer, government en el sitio de la cirugia, [place],       fiebro o dolor que se alivia con [place].  4.  A) Su visita posoperatoria  (despues de su operacion) es con el         B)  Por favor llame para hacer la cita posoperatoria.  5.  Istrucciones especificas :

## 2022-08-16 NOTE — Anesthesia Procedure Notes (Signed)
Procedure Name: LMA Insertion Date/Time: 08/16/2022 9:14 AM  Performed by: Lerry Liner, CRNAPre-anesthesia Checklist: Patient identified, Emergency Drugs available, Suction available, Patient being monitored and Timeout performed Patient Re-evaluated:Patient Re-evaluated prior to induction Oxygen Delivery Method: Circle system utilized Preoxygenation: Pre-oxygenation with 100% oxygen Induction Type: IV induction Ventilation: Mask ventilation without difficulty LMA: LMA inserted LMA Size: 3.0 Number of attempts: 3 Tube secured with: Tape Dental Injury: Bloody posterior oropharynx  Comments: Multiple attempts to place LMA (would not seat appropriately)  Use #3 LMA for future procedures.

## 2022-08-17 ENCOUNTER — Encounter: Payer: Self-pay | Admitting: General Surgery

## 2022-08-18 ENCOUNTER — Other Ambulatory Visit: Payer: Self-pay

## 2022-08-18 ENCOUNTER — Other Ambulatory Visit: Payer: Self-pay | Admitting: Pathology

## 2022-08-18 LAB — SURGICAL PATHOLOGY

## 2022-08-19 ENCOUNTER — Other Ambulatory Visit: Payer: Self-pay

## 2022-08-28 ENCOUNTER — Ambulatory Visit: Payer: Medicare HMO | Admitting: Oncology

## 2022-08-29 ENCOUNTER — Encounter: Payer: Self-pay | Admitting: Oncology

## 2022-08-29 ENCOUNTER — Inpatient Hospital Stay: Payer: Medicare HMO | Attending: Oncology | Admitting: Oncology

## 2022-08-29 ENCOUNTER — Ambulatory Visit: Payer: Medicare HMO | Admitting: Oncology

## 2022-08-29 VITALS — Temp 98.2°F | Resp 18 | Wt 121.6 lb

## 2022-08-29 DIAGNOSIS — Z7189 Other specified counseling: Secondary | ICD-10-CM

## 2022-08-29 DIAGNOSIS — Z171 Estrogen receptor negative status [ER-]: Secondary | ICD-10-CM | POA: Diagnosis not present

## 2022-08-29 DIAGNOSIS — C50311 Malignant neoplasm of lower-inner quadrant of right female breast: Secondary | ICD-10-CM | POA: Diagnosis present

## 2022-08-29 NOTE — Progress Notes (Signed)
ON PATHWAY REGIMEN - Breast  No Change  Continue With Treatment as Ordered.  Original Decision Date/Time: 02/21/2022 17:01     Cycles 1 through 4: A cycle is every 21 days:     Pembrolizumab      Paclitaxel      Carboplatin      Filgrastim-xxxx    Cycles 5 through 8: A cycle is every 21 days:     Pembrolizumab      Doxorubicin      Cyclophosphamide      Pegfilgrastim-xxxx   **Always confirm dose/schedule in your pharmacy ordering system**  Patient Characteristics: Preoperative or Nonsurgical Candidate (Clinical Staging), Neoadjuvant Therapy followed by Surgery, Invasive Disease, Chemotherapy, HER2 Negative/Unknown/Equivocal, ER Negative/Unknown, Platinum Therapy Indicated and Candidate for Checkpoint Inhibitor Therapeutic Status: Preoperative or Nonsurgical Candidate (Clinical Staging) AJCC M Category: cM0 AJCC Grade: G3 Breast Surgical Plan: Neoadjuvant Therapy followed by Surgery ER Status: Negative (-) AJCC 8 Stage Grouping: IIB HER2 Status: Negative (-) AJCC T Category: cT2 AJCC N Category: cN0 PR Status: Negative (-) Intent of Therapy: Curative Intent, Discussed with Patient

## 2022-08-29 NOTE — Progress Notes (Signed)
DISCONTINUE ON PATHWAY REGIMEN - Breast     Cycles 1 through 4: A cycle is every 21 days:     Pembrolizumab      Paclitaxel      Carboplatin      Filgrastim-xxxx    Cycles 5 through 8: A cycle is every 21 days:     Pembrolizumab      Doxorubicin      Cyclophosphamide      Pegfilgrastim-xxxx   **Always confirm dose/schedule in your pharmacy ordering system**  REASON: Other Reason PRIOR TREATMENT: [id number]: Pembrolizumab 200 mg D1 + Paclitaxel 80 mg/m2 D1, 8, 15 + Carboplatin AUC=1.5 D1, 8, 15 q21 Days x 12 Weeks, Followed by Pembrolizumab 200 mg + Doxorubicin + Cyclophosphamide q21 Days x 12 Weeks, Followed by Surgery TREATMENT RESPONSE: Complete Response (CR)  START ON PATHWAY REGIMEN - Breast     A cycle is every 21 days:     Pembrolizumab   **Always confirm dose/schedule in your pharmacy ordering system**  Patient Characteristics: Post-Neoadjuvant Therapy and Resection, HER2 Negative/Unknown/Equivocal, No Residual Disease, ER Negative/Unknown, Adjuvant Therapy - Continuation After Neoadjuvant Therapy Therapeutic Status: Post-Neoadjuvant Therapy and Resection Residual Invasive Disease Post-Neoadjuvant Therapy<= No ER Status: Negative (-) HER2 Status: Negative (-) PR Status: Negative (-) Intent of Therapy: Curative Intent, Discussed with Patient

## 2022-08-29 NOTE — Progress Notes (Signed)
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  It showed a suspicious mass at the 5:30 position of the right breast 4 cm from the nipple measuring 1.8 x 1.6 x 1.2 cm.  There may be a thickened echogenic rim which would bring the measurement to 2.9 x 1.9 x 2.3 cm.  Overlying skin thickening.  No axillary adenopathy.  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  Patient referred for further management.  History obtained with the help of Spanish interpreter.  Patient is independent of her ADLs at baseline.She reports some baseline diabetic neuropathy for which she is on gabapentin.   Family history significant for breast cancer in her sister  in her 63s.  She is here with her daughter today.  Reports her appetite and weight haveRemained stable.  She is anxious about her next steps with regards to breast cancer.   Patient is being treated with neoadjuvant intent with keynote 522 regimen starting with CarboTaxol Keytruda chemotherapy which she completed in August 2023  Interval history-patient is currently recovering well from her lumpectomy and denies any specific  complaints at this time.  History obtained with the help of Spanish interpreter.  ECOG PS- 1 Pain scale- 0   Review of systems- Review of Systems  Constitutional:  Negative for chills, fever, malaise/fatigue and weight loss.  HENT:  Negative for congestion, ear discharge and nosebleeds.   Eyes:  Negative for blurred vision.  Respiratory:  Negative for cough, hemoptysis, sputum production, shortness of breath and wheezing.   Cardiovascular:  Negative for chest pain, palpitations, orthopnea and claudication.  Gastrointestinal:  Negative for abdominal pain, blood in stool, constipation, diarrhea, heartburn, melena, nausea and vomiting.  Genitourinary:  Negative for dysuria, flank pain, frequency, hematuria and urgency.  Musculoskeletal:  Negative for back pain, joint pain and myalgias.  Skin:  Negative for rash.  Neurological:  Negative for dizziness, tingling, focal weakness, seizures, weakness and headaches.  Endo/Heme/Allergies:  Does not bruise/bleed easily.  Psychiatric/Behavioral:  Negative for depression and suicidal ideas. The patient does not have insomnia.       Allergies  Allergen Reactions   Contrast Media  [Iodinated Contrast Media] Nausea And Vomiting and Other (See Comments)    Pt was pre medicated 03/28/22 and 3 ML of contrast injected per [facility] A Cath check.  Pt reported no problems.      Past Medical History:  Diagnosis Date   Breast cancer, right breast ([place])    Diabetes mellitus without complication ([place])    Family history of prostate cancer    Gastritis    Heart murmur    History of chemotherapy    History of pancreatitis    Hyperlipidemia    Hypertension  Palpitations    Port-A-Cath in place    Vitamin B12 deficiency      Past Surgical History:  Procedure Laterality Date   BREAST BIOPSY Right 11/2015   benign   BREAST BIOPSY Right 01/24/2022   INVASIVE MAMMARY CARCINOMA, NO SPECIAL TYPE   BREAST CYST EXCISION Right    CESAREAN SECTION     2x    CHOLECYSTECTOMY     IR CV LINE INJECTION  03/28/2022   PARTIAL MASTECTOMY WITH AXILLARY SENTINEL LYMPH NODE BIOPSY Right 08/16/2022   Procedure: PARTIAL MASTECTOMY WITH AXILLARY SENTINEL LYMPH NODE BIOPSY -- RF guided;  Surgeon: Herbert Pun, MD;  Location: [facility] ORS;  Service: General;  Laterality: Right;   PORTACATH PLACEMENT N/A 03/01/2022   Procedure: INSERTION PORT-A-CATH;  Surgeon: Herbert Pun, MD;  Location: [facility] ORS;  Service: General;  Laterality: N/A;    Social History   Socioeconomic History   Marital status: Married    Spouse name: Not on file   Number of children: Not on file   Years of education: Not on file   Highest education level: Not on file  Occupational History   Not on file  Tobacco Use   Smoking status: Never   Smokeless tobacco: Never  Vaping Use   Vaping Use: Never used  Substance and Sexual Activity   Alcohol use: Never   Drug use: Never   Sexual activity: Not on file  Other Topics Concern   Not on file  Social History Narrative   Not on file   Social Determinants of Health   Financial Resource Strain: Low Risk  (02/24/2022)   Overall Financial Resource Strain (CARDIA)    Difficulty of Paying Living Expenses: Not very hard  Food Insecurity: No Food Insecurity (02/24/2022)   Hunger Vital Sign    Worried About Running Out of Food in the Last Year: Never true    Ran Out of Food in the Last Year: Never true  Transportation Needs: No Transportation Needs (02/24/2022)   PRAPARE - Hydrologist (Medical): No    Lack of Transportation (Non-Medical): No  Physical Activity: Inactive (02/24/2022)   Exercise Vital Sign    Days of Exercise per Week: 0 days    Minutes of Exercise per Session: 0 min  Stress: No Stress Concern Present (02/24/2022)   [place]    Feeling of Stress : Only a little  Social Connections: Moderately Integrated (02/24/2022)    Social Connection and Isolation Panel [NHANES]    Frequency of Communication with Friends and Family: Three times a week    Frequency of Social Gatherings with Friends and Family: Three times a week    Attends Religious Services: 1 to 4 times per year    Active Member of Clubs or Organizations: No    Attends Archivist Meetings: Never    Marital Status: Married  Human resources officer Violence: Not At Risk (02/24/2022)   Humiliation, Afraid, Rape, and Kick questionnaire    Fear of Current or Ex-Partner: No    Emotionally Abused: No    Physically Abused: No    Sexually Abused: No    Family History  Problem Relation Age of Onset   Heart disease Father    Breast cancer Sister        64s   Prostate cancer Paternal Grandfather    Stomach cancer Cousin      Current Outpatient Medications:    atorvastatin (LIPITOR) 80  MG tablet, Take 80 mg by mouth at bedtime., Disp: , Rfl:    DULoxetine (CYMBALTA) 60 MG capsule, Take 60 mg by mouth at bedtime., Disp: , Rfl:    enalapril (VASOTEC) 20 MG tablet, Take 20 mg by mouth every morning., Disp: , Rfl:    fenofibrate (TRICOR) 145 MG tablet, Take 145 mg by mouth every morning., Disp: , Rfl:    fluticasone (FLONASE) 50 MCG/ACT nasal spray, Place 2 sprays into both nostrils daily as needed for allergies or rhinitis., Disp: , Rfl:    folic acid (FOLVITE) 1 MG tablet, Take 1 tablet (1 mg total) by mouth daily., Disp: 30 tablet, Rfl: 3   gabapentin (NEURONTIN) 300 MG capsule, Take 300 mg by mouth in the morning and at bedtime., Disp: , Rfl:    glipiZIDE (GLUCOTROL XL) 2.5 MG 24 hr tablet, Take 2.5 mg by mouth daily with breakfast., Disp: , Rfl:    hydrochlorothiazide (MICROZIDE) 12.5 MG capsule, Take 12.5 mg by mouth every morning., Disp: , Rfl:    Melatonin 5 MG CAPS, Take 5 mg by mouth as needed., Disp: , Rfl:    tiZANidine (ZANAFLEX) 4 MG tablet, Take 4 mg by mouth every 8 (eight) hours as needed for muscle spasms., Disp: , Rfl:    traMADol  (ULTRAM) 50 MG tablet, Take 1 tablet (50 mg total) by mouth every 6 (six) hours as needed., Disp: 20 tablet, Rfl: 0   vitamin C (ASCORBIC ACID) 500 MG tablet, Take 500 mg by mouth daily., Disp: , Rfl:    aspirin 81 MG EC tablet, Take 1 tablet by mouth daily. (Patient not taking: Reported on 05/15/2022), Disp: , Rfl:    meloxicam (MOBIC) 15 MG tablet, Take 15 mg by mouth daily as needed for pain. (Patient not taking: Reported on 07/31/2022), Disp: , Rfl:    metoprolol succinate (TOPROL XL) 25 MG 24 hr tablet, Take 1 tablet (25 mg total) by mouth daily. (Patient not taking: Reported on 07/31/2022), Disp: 90 tablet, Rfl: 3   traMADol (ULTRAM) 50 MG tablet, Take 1 tablet (50 mg total) by mouth every 6 (six) hours as needed. (Patient not taking: Reported on 03/20/2022), Disp: 10 tablet, Rfl: 0  Physical exam:  Vitals:   08/29/22 0905  Resp: 18  Temp: 98.2 F (36.8 C)  Weight: 121 lb 9.6 oz (55.2 kg)   Physical Exam Constitutional:      General: She is not in acute distress. Cardiovascular:     Rate and Rhythm: Normal rate and regular rhythm.     Heart sounds: Normal heart sounds.  Pulmonary:     Effort: Pulmonary effort is normal.  Skin:    General: Skin is warm and dry.  Neurological:     Mental Status: She is alert and oriented to person, place, and time.   Patient is s/p right lumpectomy and sentinel lymph node biopsy with a scar appears to be well-healing.     Latest Ref Rng & Units 07/31/2022    8:05 AM  CMP  Glucose 70 - 99 mg/dL 274   BUN 8 - 23 mg/dL 23   Creatinine 0.44 - 1.00 mg/dL 0.69   Sodium 135 - 145 mmol/L 133   Potassium 3.5 - 5.1 mmol/L 3.8   Chloride 98 - 111 mmol/L 101   CO2 22 - 32 mmol/L 24   Calcium 8.9 - 10.3 mg/dL 8.7   Total Protein 6.5 - 8.1 g/dL 6.6   Total Bilirubin 0.3 - 1.2 mg/dL 0.3  Alkaline Phos 38 - 126 U/L 58   AST 15 - 41 U/L 23   ALT 0 - 44 U/L 19       Latest Ref Rng & Units 07/31/2022    8:05 AM  CBC  WBC 4.0 - 10.5 K/uL 5.0    Hemoglobin 12.0 - 15.0 g/dL 10.2   Hematocrit 36.0 - 46.0 % 30.1   Platelets 150 - 400 K/uL 231       MM Breast Surgical Specimen  Result Date: 08/16/2022 CLINICAL DATA:  Status post Savi Scout localized RIGHT breast lumpectomy. Status post biopsy February 2023 which demonstrated invasive mammary carcinoma grade 3 (RIBBON clip). Patient is status post neoadjuvant chemotherapy. EXAM: SPECIMEN RADIOGRAPH OF THE RIGHT BREAST COMPARISON:  Previous exam(s). FINDINGS: Status post excision of the RIGHT breast. The [facility] reflector and RIBBON shaped clip are present within the specimen. IMPRESSION: Specimen radiograph of the RIGHT breast. Electronically Signed   By: Valentino Saxon M.D.   On: 08/16/2022 10:06  NM Sentinel Node Inj-No Rpt (Breast)  Result Date: 08/16/2022 Sulfur Colloid was injected by the Nuclear Medicine Technologist for sentinel lymph node localization.   MM RT RADIO FREQUENCY TAG LOC MAMMO GUIDE  Result Date: 08/15/2022 CLINICAL DATA:  66 year old female with history of grade 3 invasive ductal carcinoma in the right breast diagnosed in February 2023 status post neoadjuvant chemotherapy, presenting for localization prior to lumpectomy. EXAM: NEEDLE LOCALIZATION OF THE RIGHT BREAST WITH MAMMO GUIDANCE COMPARISON:  Previous exam(s). FINDINGS: Patient presents for needle localization prior to surgery. I met with the patient and we discussed the procedure of needle localization including benefits and alternatives. We discussed the high likelihood of a successful procedure. We discussed the risks of the procedure, including infection, bleeding, tissue injury, and further surgery. Informed, written consent was given. The usual time-out protocol was performed immediately prior to the procedure. Using mammographic guidance, sterile technique, 1% lidocaine and a 10 cm SAVI SCOUT needle, the ribbon clip was localized using a medial approach. Reflector function was confirmed with an  auditory signal from the SAVI SCOUT guide. The images were marked for the breast surgeon. IMPRESSION: Radar reflector localization of the RIGHT breast. No apparent complications. Electronically Signed   By: Beryle Flock M.D.   On: 08/15/2022 17:15  MR BREAST BILATERAL W WO CONTRAST INC CAD  Result Date: 08/09/2022 CLINICAL DATA:  66 year old female with history of grade 3 invasive mammary carcinoma of the right breast diagnosed in February of 2023 by ultrasound guided biopsy. EXAM: BILATERAL BREAST MRI WITH AND WITHOUT CONTRAST TECHNIQUE: Multiplanar, multisequence MR images of both breasts were obtained prior to and following the intravenous administration of 5 ml of Gadavist Three-dimensional MR images were rendered by post-processing of the original MR data on an independent workstation. The three-dimensional MR images were interpreted, and findings are reported in the following complete MRI report for this study. Three dimensional images were evaluated at the independent interpreting workstation using the DynaCAD thin client. COMPARISON:  Previous exam(s). FINDINGS: Breast composition: c. Heterogeneous fibroglandular tissue. Background parenchymal enhancement: Minimal The images are significantly degraded by motion. The technologist notes indicate that the patient was given multiple reminders to remain still, and that they attempted to provide pads 2 cushion areas of discomfort. However, the patient was experiencing pain that could not be sufficiently addressed in order for her to remain still for the scan. Right breast: The marked enhancement previously associated with the biopsy-proven malignancy in the lower-inner right breast has significantly improved.  There appears to be mild residual enhancement, though some of this may be due to motion artifact. No new mass or abnormal enhancement. Left breast: No mass or abnormal enhancement. Lymph nodes: No abnormal appearing lymph nodes. Ancillary findings:   None. IMPRESSION: 1. Images are significantly degraded by motion artifact, which limits interpretation of this exam. 2. Significant improvement in the enhancement at the site of known malignancy in the lower-inner right breast. There appears to be mild residual enhancement, though some of this could be due to motion artifact. 3.  No evidence of malignancy in the left breast. RECOMMENDATION: Continue treatment plan. BI-RADS CATEGORY  6: Known biopsy-proven malignancy. Electronically Signed   By: Ammie Ferrier M.D.   On: 08/09/2022 12:57    Assessment and plan- Patient is a 66 y.o. female with triple negative breast cancer stage IIb and cT2 N0 M0.  She is s/p neoadjuvant chemotherapy as per keynote 522 regimen and here to discuss final pathology results and further management  Patient has achieved pathological complete response with neoadjuvant chemotherapy.  She will now go on to receive adjuvant radiation treatment and I will refer her to radiation oncology for this.  She will also continue Keytruda for 9 cycles following surgery.  Patient wishes to start treatment after her daughter gets back from vacation and therefore she will receive cycle 1 of Keytruda on 09/13/2022.  Treatment will be given with a curative intent.   Visit Diagnosis 1. Malignant neoplasm of lower-inner quadrant of right breast of female, estrogen receptor negative ([place])   2. Goals of care, counseling/discussion      Dr. Randa Evens, MD, MPH [facility] [phone] 08/29/2022 12:50 PM

## 2022-08-30 ENCOUNTER — Other Ambulatory Visit: Payer: Self-pay

## 2022-09-13 ENCOUNTER — Inpatient Hospital Stay: Payer: Medicare HMO | Attending: Oncology

## 2022-09-13 VITALS — BP 146/69 | HR 86 | Temp 96.9°F | Resp 16 | Ht 60.0 in | Wt 122.6 lb

## 2022-09-13 DIAGNOSIS — Z171 Estrogen receptor negative status [ER-]: Secondary | ICD-10-CM | POA: Insufficient documentation

## 2022-09-13 DIAGNOSIS — C50311 Malignant neoplasm of lower-inner quadrant of right female breast: Secondary | ICD-10-CM | POA: Insufficient documentation

## 2022-09-13 DIAGNOSIS — Z79899 Other long term (current) drug therapy: Secondary | ICD-10-CM | POA: Diagnosis not present

## 2022-09-13 DIAGNOSIS — Z5112 Encounter for antineoplastic immunotherapy: Secondary | ICD-10-CM | POA: Diagnosis not present

## 2022-09-13 LAB — COMPREHENSIVE METABOLIC PANEL
ALT: 21 U/L (ref 0–44)
AST: 24 U/L (ref 15–41)
Albumin: 4 g/dL (ref 3.5–5.0)
Alkaline Phosphatase: 62 U/L (ref 38–126)
Anion gap: 7 (ref 5–15)
BUN: 25 mg/dL — ABNORMAL HIGH (ref 8–23)
CO2: 26 mmol/L (ref 22–32)
Calcium: 9 mg/dL (ref 8.9–10.3)
Chloride: 103 mmol/L (ref 98–111)
Creatinine, Ser: 0.78 mg/dL (ref 0.44–1.00)
GFR, Estimated: >60 mL/min (ref >60)
Glucose, Bld: 274 mg/dL — ABNORMAL HIGH (ref 70–99)
Potassium: 4 mmol/L (ref 3.5–5.1)
Sodium: 136 mmol/L (ref 135–145)
Total Bilirubin: 0.5 mg/dL (ref 0.3–1.2)
Total Protein: 7.1 g/dL (ref 6.5–8.1)

## 2022-09-13 LAB — CBC WITH DIFFERENTIAL/PLATELET
Abs Immature Granulocytes: 0.01 10*3/uL (ref 0.00–0.07)
Basophils Absolute: 0.1 10*3/uL (ref 0.0–0.1)
Basophils Relative: 1 %
Eosinophils Absolute: 0.8 10*3/uL — ABNORMAL HIGH (ref 0.0–0.5)
Eosinophils Relative: 13 %
HCT: 35.2 % — ABNORMAL LOW (ref 36.0–46.0)
Hemoglobin: 11.9 g/dL — ABNORMAL LOW (ref 12.0–15.0)
Immature Granulocytes: 0 %
Lymphocytes Relative: 19 %
Lymphs Abs: 1.2 10*3/uL (ref 0.7–4.0)
MCH: 34.1 pg — ABNORMAL HIGH (ref 26.0–34.0)
MCHC: 33.8 g/dL (ref 30.0–36.0)
MCV: 100.9 fL — ABNORMAL HIGH (ref 80.0–100.0)
Monocytes Absolute: 0.6 10*3/uL (ref 0.1–1.0)
Monocytes Relative: 9 %
Neutro Abs: 3.8 10*3/uL (ref 1.7–7.7)
Neutrophils Relative %: 58 %
Platelets: 223 10*3/uL (ref 150–400)
RBC: 3.49 MIL/uL — ABNORMAL LOW (ref 3.87–5.11)
RDW: 13.6 % (ref 11.5–15.5)
WBC: 6.6 10*3/uL (ref 4.0–10.5)
nRBC: 0 % (ref 0.0–0.2)

## 2022-09-13 LAB — TSH: TSH: 2.182 u[IU]/mL (ref 0.350–4.500)

## 2022-09-13 MED ORDER — SODIUM CHLORIDE 0.9 % IV SOLN
Freq: Once | INTRAVENOUS | Status: AC
  Filled 2022-09-13: qty 250

## 2022-09-13 MED ORDER — SODIUM CHLORIDE 0.9 % IV SOLN
200.0000 mg | Freq: Once | INTRAVENOUS | Status: AC
  Administered 2022-09-13: 200 mg via INTRAVENOUS
  Filled 2022-09-13: qty 200

## 2022-09-13 MED ORDER — HEPARIN SOD (PORK) LOCK FLUSH 100 UNIT/ML IV SOLN
INTRAVENOUS | Status: AC
  Filled 2022-09-13: qty 5

## 2022-09-13 MED ORDER — HEPARIN SOD (PORK) LOCK FLUSH 100 UNIT/ML IV SOLN
500.0000 [IU] | Freq: Once | INTRAVENOUS | Status: AC | PRN
  Administered 2022-09-13: 500 [IU]
  Filled 2022-09-13: qty 5

## 2022-09-15 LAB — T4: T4, Total: 8.4 ug/dL (ref 4.5–12.0)

## 2022-09-23 ENCOUNTER — Emergency Department: Payer: Medicare HMO

## 2022-09-23 ENCOUNTER — Other Ambulatory Visit: Payer: Self-pay

## 2022-09-23 ENCOUNTER — Emergency Department
Admission: EM | Admit: 2022-09-23 | Discharge: 2022-09-23 | Disposition: A | Discharge status: Home / Self Care | Payer: Medicare HMO | Attending: Emergency Medicine | Admitting: Emergency Medicine

## 2022-09-23 ENCOUNTER — Encounter: Payer: Self-pay | Admitting: Emergency Medicine

## 2022-09-23 DIAGNOSIS — Z853 Personal history of malignant neoplasm of breast: Secondary | ICD-10-CM | POA: Insufficient documentation

## 2022-09-23 DIAGNOSIS — U071 COVID-19: Secondary | ICD-10-CM | POA: Diagnosis not present

## 2022-09-23 DIAGNOSIS — Z5112 Encounter for antineoplastic immunotherapy: Secondary | ICD-10-CM | POA: Diagnosis not present

## 2022-09-23 DIAGNOSIS — E119 Type 2 diabetes mellitus without complications: Secondary | ICD-10-CM | POA: Diagnosis not present

## 2022-09-23 DIAGNOSIS — I10 Essential (primary) hypertension: Secondary | ICD-10-CM | POA: Insufficient documentation

## 2022-09-23 DIAGNOSIS — R059 Cough, unspecified: Secondary | ICD-10-CM | POA: Diagnosis present

## 2022-09-23 LAB — CBC WITH DIFFERENTIAL/PLATELET
Abs Immature Granulocytes: 0.02 10*3/uL (ref 0.00–0.07)
Basophils Absolute: 0 10*3/uL (ref 0.0–0.1)
Basophils Relative: 1 %
Eosinophils Absolute: 0.5 10*3/uL (ref 0.0–0.5)
Eosinophils Relative: 9 %
HCT: 34.6 % — ABNORMAL LOW (ref 36.0–46.0)
Hemoglobin: 11.2 g/dL — ABNORMAL LOW (ref 12.0–15.0)
Immature Granulocytes: 0 %
Lymphocytes Relative: 24 %
Lymphs Abs: 1.4 10*3/uL (ref 0.7–4.0)
MCH: 32.5 pg (ref 26.0–34.0)
MCHC: 32.4 g/dL (ref 30.0–36.0)
MCV: 100.3 fL — ABNORMAL HIGH (ref 80.0–100.0)
Monocytes Absolute: 0.6 10*3/uL (ref 0.1–1.0)
Monocytes Relative: 10 %
Neutro Abs: 3.2 10*3/uL (ref 1.7–7.7)
Neutrophils Relative %: 56 %
Platelets: 330 10*3/uL (ref 150–400)
RBC: 3.45 MIL/uL — ABNORMAL LOW (ref 3.87–5.11)
RDW: 13.2 % (ref 11.5–15.5)
WBC: 5.7 10*3/uL (ref 4.0–10.5)
nRBC: 0 % (ref 0.0–0.2)

## 2022-09-23 LAB — SARS CORONAVIRUS 2 BY RT PCR: SARS Coronavirus 2 by RT PCR: POSITIVE — AB

## 2022-09-23 LAB — COMPREHENSIVE METABOLIC PANEL
ALT: 22 U/L (ref 0–44)
AST: 31 U/L (ref 15–41)
Albumin: 4.2 g/dL (ref 3.5–5.0)
Alkaline Phosphatase: 69 U/L (ref 38–126)
Anion gap: 10 (ref 5–15)
BUN: 22 mg/dL (ref 8–23)
CO2: 27 mmol/L (ref 22–32)
Calcium: 9.1 mg/dL (ref 8.9–10.3)
Chloride: 99 mmol/L (ref 98–111)
Creatinine, Ser: 0.8 mg/dL (ref 0.44–1.00)
GFR, Estimated: >60 mL/min (ref >60)
Glucose, Bld: 251 mg/dL — ABNORMAL HIGH (ref 70–99)
Potassium: 3.4 mmol/L — ABNORMAL LOW (ref 3.5–5.1)
Sodium: 136 mmol/L (ref 135–145)
Total Bilirubin: 0.7 mg/dL (ref 0.3–1.2)
Total Protein: 7.3 g/dL (ref 6.5–8.1)

## 2022-09-23 MED ORDER — NIRMATRELVIR/RITONAVIR (PAXLOVID)TABLET: 3.0000 | ORAL_TABLET | Freq: Two times a day (BID) | ORAL | 0 refills | Status: AC

## 2022-09-23 NOTE — ED Provider Notes (Signed)
[facility] Provider Note    Event Date/Time   First MD Initiated Contact with Patient 09/23/22 2211     (approximate)  History   Chief Complaint: Cough  HPI  Susan [name] is a 66 y.o. female with a past medical history of breast cancer on immunotherapy, diabetes, hypertension, hyperlipidemia, presents emergency department for 2 weeks of cough and congestion.  According to the patient for the past 2 weeks she has had slight cough which she feels has worsened recently as well as nasal congestion.  She states the congestion is her main concern.  Patient denies any known fever at any point over the past 2 weeks.  Patient states approximately 2 weeks ago she did have an exposure to a patient with COVID.  Patient denies any chemotherapy but is currently taking immunotherapy.  Physical Exam   Triage Vital Signs: ED Triage Vitals  Enc Vitals Group     BP 09/23/22 1922 (!) 149/65     Pulse Rate 09/23/22 1922 87     Resp 09/23/22 1922 18     Temp 09/23/22 1922 98.2 F (36.8 C)     Temp Source 09/23/22 1922 Oral     SpO2 09/23/22 1922 98 %     Weight 09/23/22 1923 122 lb 9.2 oz (55.6 kg)     Height 09/23/22 1923 5' (1.524 m)     Head Circumference --      Peak Flow --      Pain Score 09/23/22 1922 0     Pain Loc --      Pain Edu? --      Excl. in [place]? --     Most recent vital signs: Vitals:   09/23/22 1922  BP: (!) 149/65  Pulse: 87  Resp: 18  Temp: 98.2 F (36.8 C)  SpO2: 98%    General: Awake, no distress.  CV:  Good peripheral perfusion.  Regular rate and rhythm  Resp:  Normal effort.  Equal breath sounds bilaterally.  Abd:  No distention.  Soft, nontender.  No rebound or guarding.   ED Results / Procedures / Treatments   RADIOLOGY  I have reviewed and interpreted the chest x-ray images.  No obvious consolidation on my evaluation. Radiology is read the chest x-ray is negative for acute process.   MEDICATIONS ORDERED IN  ED: Medications - No data to display   IMPRESSION / MDM / [place] / ED COURSE  I reviewed the triage vital signs and the nursing notes.  Patient's presentation is most consistent with acute presentation with potential threat to life or bodily function.  Patient presents emergency department for cough and congestion for approximately 1.5 to 2 weeks.  Patient states recent COVID exposure as well.  Patient is on immunotherapy.  Reassuringly patient's CBC is normal with a normal white blood cell count and does not appear to be neutropenic.  Patient's chemistry shows mild hyperglycemia otherwise normal.  Patient's chest x-ray is clear.  Patient's COVID swab is positive.  Patient has had symptoms for at least 1 week probably closer to 2 weeks however given the patient's continued positive COVID test and continued symptoms and the patient is on immunotherapy I still believe it would be warranted to place the patient on Paxlovid.  I did discussed with the patient to call her oncologist Monday morning to inform them of today's ER visit her COVID diagnosis and her Paxlovid to see if they want her to continue this  medication or not.  I also discussed with the patient supportive care including Tylenol or ibuprofen for fever/discomfort as well as using over-the-counter pseudoephedrine as needed for congestion.  Patient agreeable to plan of care.  Patient is to follow-up with her oncologist Monday morning.  FINAL CLINICAL IMPRESSION(S) / ED DIAGNOSES   COVID-19   Note:  This document was prepared using Dragon voice recognition software and may include unintentional dictation errors.   Harvest Dark, MD 09/23/22 2239

## 2022-09-23 NOTE — Discharge Instructions (Addendum)
Please take your medications as prescribed.  Please call your oncologist monday morning to inform them of todays ER visit and your POSITIVE covid test.  Please discuss with them whether or not you should continue the Paxlovid medication.    Please use tylenol as needed for fever/discomfort as written on the box.  You many also use over the counter pseudoephedrine for congestion as written on the box.    Return to the ER for any chest pain, trouble breathing or any other symptom concerning to yourself.

## 2022-09-23 NOTE — ED Notes (Addendum)
error 

## 2022-09-23 NOTE — ED Notes (Signed)
E signature pad not working 

## 2022-09-23 NOTE — ED Triage Notes (Signed)
VIR used for triage.   Pt to ED via POV. Pt to reports increased nasal congestion and cough. Pt states symptoms x 2 weeks. Pt with noted cough and nasal congestion in triage. Pt denies pain at this time. Pt states cough with yellow/white phlegm. Pt states +exposure to Covid approx 2 weeks-3 weeks ago. Pt able to speak in full and complete sentences at this time.   Pt states is currently undergoing immunotherapy for breast cancer at this time.

## 2022-09-24 ENCOUNTER — Encounter: Payer: Self-pay | Admitting: Oncology

## 2022-10-04 ENCOUNTER — Inpatient Hospital Stay: Payer: Medicare HMO

## 2022-10-04 ENCOUNTER — Other Ambulatory Visit: Payer: Self-pay

## 2022-10-04 ENCOUNTER — Inpatient Hospital Stay: Payer: Medicare HMO | Attending: Oncology

## 2022-10-04 VITALS — BP 137/71 | HR 83 | Temp 97.6°F | Resp 16

## 2022-10-04 DIAGNOSIS — Z5112 Encounter for antineoplastic immunotherapy: Secondary | ICD-10-CM | POA: Insufficient documentation

## 2022-10-04 DIAGNOSIS — Z171 Estrogen receptor negative status [ER-]: Secondary | ICD-10-CM | POA: Diagnosis not present

## 2022-10-04 DIAGNOSIS — C50311 Malignant neoplasm of lower-inner quadrant of right female breast: Secondary | ICD-10-CM | POA: Insufficient documentation

## 2022-10-04 DIAGNOSIS — Z79899 Other long term (current) drug therapy: Secondary | ICD-10-CM | POA: Diagnosis not present

## 2022-10-04 LAB — COMPREHENSIVE METABOLIC PANEL
ALT: 21 U/L (ref 0–44)
AST: 27 U/L (ref 15–41)
Albumin: 4.1 g/dL (ref 3.5–5.0)
Alkaline Phosphatase: 79 U/L (ref 38–126)
Anion gap: 9 (ref 5–15)
BUN: 21 mg/dL (ref 8–23)
CO2: 25 mmol/L (ref 22–32)
Calcium: 9 mg/dL (ref 8.9–10.3)
Chloride: 100 mmol/L (ref 98–111)
Creatinine, Ser: 0.58 mg/dL (ref 0.44–1.00)
GFR, Estimated: >60 mL/min (ref >60)
Glucose, Bld: 222 mg/dL — ABNORMAL HIGH (ref 70–99)
Potassium: 3.8 mmol/L (ref 3.5–5.1)
Sodium: 134 mmol/L — ABNORMAL LOW (ref 135–145)
Total Bilirubin: 0.5 mg/dL (ref 0.3–1.2)
Total Protein: 7.2 g/dL (ref 6.5–8.1)

## 2022-10-04 LAB — CBC WITH DIFFERENTIAL/PLATELET
Abs Immature Granulocytes: 0.01 10*3/uL (ref 0.00–0.07)
Basophils Absolute: 0 10*3/uL (ref 0.0–0.1)
Basophils Relative: 1 %
Eosinophils Absolute: 0.5 10*3/uL (ref 0.0–0.5)
Eosinophils Relative: 10 %
HCT: 34 % — ABNORMAL LOW (ref 36.0–46.0)
Hemoglobin: 11.5 g/dL — ABNORMAL LOW (ref 12.0–15.0)
Immature Granulocytes: 0 %
Lymphocytes Relative: 27 %
Lymphs Abs: 1.3 10*3/uL (ref 0.7–4.0)
MCH: 33.3 pg (ref 26.0–34.0)
MCHC: 33.8 g/dL (ref 30.0–36.0)
MCV: 98.6 fL (ref 80.0–100.0)
Monocytes Absolute: 0.6 10*3/uL (ref 0.1–1.0)
Monocytes Relative: 11 %
Neutro Abs: 2.5 10*3/uL (ref 1.7–7.7)
Neutrophils Relative %: 51 %
Platelets: 221 10*3/uL (ref 150–400)
RBC: 3.45 MIL/uL — ABNORMAL LOW (ref 3.87–5.11)
RDW: 12.7 % (ref 11.5–15.5)
WBC: 4.8 10*3/uL (ref 4.0–10.5)
nRBC: 0 % (ref 0.0–0.2)

## 2022-10-04 MED ORDER — SODIUM CHLORIDE 0.9 % IV SOLN
Freq: Once | INTRAVENOUS | Status: AC
  Filled 2022-10-04: qty 250

## 2022-10-04 MED ORDER — HEPARIN SOD (PORK) LOCK FLUSH 100 UNIT/ML IV SOLN
500.0000 [IU] | Freq: Once | INTRAVENOUS | Status: AC | PRN
  Administered 2022-10-04: 500 [IU]
  Filled 2022-10-04: qty 5

## 2022-10-04 MED ORDER — SODIUM CHLORIDE 0.9 % IV SOLN
200.0000 mg | Freq: Once | INTRAVENOUS | Status: AC
  Administered 2022-10-04: 200 mg via INTRAVENOUS
  Filled 2022-10-04: qty 8

## 2022-10-04 MED ORDER — SODIUM CHLORIDE 0.9% FLUSH
10.0000 mL | INTRAVENOUS | Status: DC | PRN
  Administered 2022-10-04: 10 mL
  Filled 2022-10-04: qty 10

## 2022-10-04 NOTE — Patient Instructions (Signed)
Instrucciones al darle de alta: Discharge Instructions Gracias por elegir al [facility] de Cncer de [place] para brindarle atencin mdica de oncologa y Music therapist.   Si usted tiene una cita de laboratorio con [place], por favor vaya directamente [place] y regstrese en el rea de Control and instrumentation engineer.   Use ropa cmoda y [place] para tener fcil acceso a las vas del Portacath (acceso venoso de Engineer, site duracin) o la lnea PICC (catter central colocado por va perifrica).   Nos esforzamos por ofrecerle tiempo de calidad con su proveedor. Es posible que tenga que volver a programar su cita si llega tarde (15 minutos o ms).  El llegar tarde le afecta a usted y a otros pacientes cuyas citas son posteriores a Merchandiser, retail.  Adems, si usted falta a tres o ms citas sin avisar a la oficina, puede ser retirado(a) de la clnica a discrecin del proveedor.      Para las solicitudes de renovacin de recetas, pida a su farmacia que se ponga en contacto con nuestra oficina y deje que transcurran 38 horas para que se complete el proceso de las renovaciones.    Hoy usted recibi los siguientes agentes de quimioterapia e/o inmunoterapia- Keytruda      Para ayudar a prevenir las nuseas y los vmitos despus de su tratamiento, le recomendamos que tome su medicamento para las nuseas segn las indicaciones.  LOS SNTOMAS QUE DEBEN COMUNICARSE INMEDIATAMENTE SE INDICAN A CONTINUACIN: *FIEBRE SUPERIOR A 100.4 F (38 C) O MS *ESCALOFROS O SUDORACIN *NUSEAS Y VMITOS QUE NO SE CONTROLAN CON EL MEDICAMENTO PARA LAS NUSEAS *DIFICULTAD INUSUAL PARA RESPIRAR  *MORETONES O HEMORRAGIAS NO HABITUALES *PROBLEMAS URINARIOS (dolor o ardor al Garment/textile technologist o frecuencia para Garment/textile technologist) *PROBLEMAS INTESTINALES (diarrea inusual, estreimiento, dolor cerca del ano) SENSIBILIDAD EN LA BOCA Y EN LA GARGANTA CON O SIN LA PRESENCIA DE LCERAS (dolor de garganta, llagas en la boca o dolor de muelas/dientes) ERUPCIN,  HINCHAZN O DOLORES INUSUALES FLUJO VAGINAL INUSUAL O PICAZN/RASQUIA    Los puntos marcados con un asterisco ( *) indican una posible emergencia y debe hacer un seguimiento tan pronto como le sea posible o vaya al Departamento de Emergencias si se le presenta algn problema.  Por favor, muestre la [place] DE ADVERTENCIA DE Windy Canny DE ADVERTENCIA DE Benay Spice al registrarse en [address] de Emergencias y a la enfermera de triaje.  Si tiene preguntas despus de su visita o necesita cancelar o volver a programar su cita, por favor pngase en contacto con [facility] [place]  [phone]  y [place] instrucciones. Las horas de oficina son de 8:00 a.m. a 4:30 p.m. de lunes a viernes. Por favor, tenga en cuenta que los mensajes de voz que se dejan despus de las 4:00 p.m. posiblemente no se devolvern hasta el siguiente da de [place].  Cerramos los fines de semana y [employer]. En todo momento tiene acceso a una enfermera para preguntas urgentes. Por favor, llame al nmero principal de la clnica  [phone] y [place] instrucciones.   Para cualquier pregunta que no sea de carcter urgente, tambin puede ponerse en contacto con su proveedor [employer]. Ahora ofrecemos visitas electrnicas para cualquier persona mayor de 18 aos que solicite atencin mdica en lnea para los sntomas que no sean urgentes. Para ms detalles vaya a [email or web address].   Tambin puede bajar la aplicacin de MyChart! Vaya a la tienda de aplicaciones, busque "MyChart", abra la aplicacin, seleccione  [place], e ingrese con su nombre de usuario y la contrasea de Pharmacist, community.  Las mscaras son opcionales en los centros de Hotel manager. Si desea que su equipo de cuidados mdicos use una [employer] atienden, por favor hgaselo saber al personal. Bethann Berkshire una persona de apoyo que tenga por lo menos 16 aos para que le acompae a sus  citas.

## 2022-10-05 ENCOUNTER — Other Ambulatory Visit: Payer: Self-pay

## 2022-10-13 ENCOUNTER — Encounter: Payer: Self-pay | Admitting: Oncology

## 2022-10-13 ENCOUNTER — Other Ambulatory Visit: Payer: Self-pay | Admitting: Oncology

## 2022-10-13 DIAGNOSIS — C50311 Malignant neoplasm of lower-inner quadrant of right female breast: Secondary | ICD-10-CM

## 2022-10-15 ENCOUNTER — Other Ambulatory Visit: Payer: Self-pay

## 2022-10-16 ENCOUNTER — Other Ambulatory Visit: Payer: Self-pay | Admitting: Oncology

## 2022-10-16 DIAGNOSIS — C50311 Malignant neoplasm of lower-inner quadrant of right female breast: Secondary | ICD-10-CM

## 2022-10-16 NOTE — Telephone Encounter (Signed)
done

## 2022-10-17 ENCOUNTER — Other Ambulatory Visit: Payer: Self-pay

## 2022-10-25 ENCOUNTER — Other Ambulatory Visit: Payer: Medicare HMO

## 2022-10-25 ENCOUNTER — Ambulatory Visit: Payer: Medicare HMO | Admitting: Oncology

## 2022-10-25 ENCOUNTER — Ambulatory Visit: Payer: Medicare HMO

## 2022-10-31 ENCOUNTER — Inpatient Hospital Stay: Payer: Medicare HMO

## 2022-10-31 ENCOUNTER — Inpatient Hospital Stay (HOSPITAL_BASED_OUTPATIENT_CLINIC_OR_DEPARTMENT_OTHER): Payer: Medicare HMO | Admitting: Oncology

## 2022-10-31 ENCOUNTER — Encounter: Payer: Self-pay | Admitting: Oncology

## 2022-10-31 VITALS — BP 129/80 | HR 91 | Temp 98.4°F | Resp 18 | Wt 118.7 lb

## 2022-10-31 DIAGNOSIS — Z171 Estrogen receptor negative status [ER-]: Secondary | ICD-10-CM

## 2022-10-31 DIAGNOSIS — C50311 Malignant neoplasm of lower-inner quadrant of right female breast: Secondary | ICD-10-CM

## 2022-10-31 DIAGNOSIS — Z5112 Encounter for antineoplastic immunotherapy: Secondary | ICD-10-CM

## 2022-10-31 LAB — CBC WITH DIFFERENTIAL/PLATELET
Abs Immature Granulocytes: 0.02 10*3/uL (ref 0.00–0.07)
Basophils Absolute: 0.1 10*3/uL (ref 0.0–0.1)
Basophils Relative: 1 %
Eosinophils Absolute: 0.9 10*3/uL — ABNORMAL HIGH (ref 0.0–0.5)
Eosinophils Relative: 14 %
HCT: 36.7 % (ref 36.0–46.0)
Hemoglobin: 12.1 g/dL (ref 12.0–15.0)
Immature Granulocytes: 0 %
Lymphocytes Relative: 21 %
Lymphs Abs: 1.4 10*3/uL (ref 0.7–4.0)
MCH: 32.4 pg (ref 26.0–34.0)
MCHC: 33 g/dL (ref 30.0–36.0)
MCV: 98.1 fL (ref 80.0–100.0)
Monocytes Absolute: 0.5 10*3/uL (ref 0.1–1.0)
Monocytes Relative: 8 %
Neutro Abs: 3.8 10*3/uL (ref 1.7–7.7)
Neutrophils Relative %: 56 %
Platelets: 291 10*3/uL (ref 150–400)
RBC: 3.74 MIL/uL — ABNORMAL LOW (ref 3.87–5.11)
RDW: 13 % (ref 11.5–15.5)
WBC: 6.7 10*3/uL (ref 4.0–10.5)
nRBC: 0 % (ref 0.0–0.2)

## 2022-10-31 LAB — COMPREHENSIVE METABOLIC PANEL
ALT: 24 U/L (ref 0–44)
AST: 27 U/L (ref 15–41)
Albumin: 4.1 g/dL (ref 3.5–5.0)
Alkaline Phosphatase: 62 U/L (ref 38–126)
Anion gap: 9 (ref 5–15)
BUN: 26 mg/dL — ABNORMAL HIGH (ref 8–23)
CO2: 25 mmol/L (ref 22–32)
Calcium: 9.3 mg/dL (ref 8.9–10.3)
Chloride: 99 mmol/L (ref 98–111)
Creatinine, Ser: 0.76 mg/dL (ref 0.44–1.00)
GFR, Estimated: >60 mL/min (ref >60)
Glucose, Bld: 246 mg/dL — ABNORMAL HIGH (ref 70–99)
Potassium: 4 mmol/L (ref 3.5–5.1)
Sodium: 133 mmol/L — ABNORMAL LOW (ref 135–145)
Total Bilirubin: 0.4 mg/dL (ref 0.3–1.2)
Total Protein: 7.1 g/dL (ref 6.5–8.1)

## 2022-10-31 MED ORDER — HEPARIN SOD (PORK) LOCK FLUSH 100 UNIT/ML IV SOLN
500.0000 [IU] | Freq: Once | INTRAVENOUS | Status: AC | PRN
  Administered 2022-10-31: 500 [IU]
  Filled 2022-10-31: qty 5

## 2022-10-31 MED ORDER — SODIUM CHLORIDE 0.9 % IV SOLN
Freq: Once | INTRAVENOUS | Status: AC
  Filled 2022-10-31: qty 250

## 2022-10-31 MED ORDER — SODIUM CHLORIDE 0.9 % IV SOLN
200.0000 mg | Freq: Once | INTRAVENOUS | Status: AC
  Administered 2022-10-31: 200 mg via INTRAVENOUS
  Filled 2022-10-31: qty 200

## 2022-10-31 MED ORDER — SODIUM CHLORIDE 0.9% FLUSH
10.0000 mL | INTRAVENOUS | Status: DC | PRN
  Administered 2022-10-31: 10 mL
  Filled 2022-10-31: qty 10

## 2022-10-31 NOTE — Progress Notes (Signed)
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  It showed a suspicious mass at the 5:30 position of the right breast 4 cm from the nipple measuring 1.8 x 1.6 x 1.2 cm.  There may be a thickened echogenic rim which would bring the measurement to 2.9 x 1.9 x 2.3 cm.  Overlying skin thickening.  No axillary adenopathy.  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      Patient completed neoadjuvant intent with keynote 522 regimen starting with CarboTaxol Keytruda chemotherapy which she completed in August 2023.  Patient then underwent mastectomy with sentinel lymph node biopsy which showed a pathological complete response.  She did not require any adjuvant radiation.  Interval history-history obtained with the help of Spanish interpreter.  Overall the patient is doing well and denies any specific complaints at this time.  Tolerating Keytruda well without any significant side effects  ECOG PS- 1 Pain scale- 0   Review of systems- Review of Systems  Constitutional:  Negative for chills, fever, malaise/fatigue and weight loss.  HENT:  Negative for  congestion, ear discharge and nosebleeds.   Eyes:  Negative for blurred vision.  Respiratory:  Negative for cough, hemoptysis, sputum production, shortness of breath and wheezing.   Cardiovascular:  Negative for chest pain, palpitations, orthopnea and claudication.  Gastrointestinal:  Negative for abdominal pain, blood in stool, constipation, diarrhea, heartburn, melena, nausea and vomiting.  Genitourinary:  Negative for dysuria, flank pain, frequency, hematuria and urgency.  Musculoskeletal:  Negative for back pain, joint pain and myalgias.  Skin:  Negative for rash.  Neurological:  Negative for dizziness, tingling, focal weakness, seizures, weakness and headaches.  Endo/Heme/Allergies:  Does not bruise/bleed easily.  Psychiatric/Behavioral:  Negative for depression and suicidal ideas. The patient does not have insomnia.       Allergies  Allergen Reactions   Contrast Media  [Iodinated Contrast Media] Nausea And Vomiting and Other (See Comments)    Pt was pre medicated 03/28/22 and 3 ML of contrast injected per [facility] A Cath check.  Pt reported no problems.      Past Medical History:  Diagnosis Date   Breast cancer, right breast ([place])    Diabetes mellitus without complication ([place])    Family history of prostate cancer    Gastritis    Heart murmur    History of chemotherapy    History of pancreatitis    Hyperlipidemia    Hypertension    Palpitations    Port-A-Cath in place    Vitamin B12 deficiency      Past Surgical History:  Procedure Laterality Date   BREAST BIOPSY Right 11/2015   benign   BREAST BIOPSY Right 01/24/2022  INVASIVE MAMMARY CARCINOMA, NO SPECIAL TYPE   BREAST CYST EXCISION Right    CESAREAN SECTION     2x   CHOLECYSTECTOMY     IR CV LINE INJECTION  03/28/2022   PARTIAL MASTECTOMY WITH AXILLARY SENTINEL LYMPH NODE BIOPSY Right 08/16/2022   Procedure: PARTIAL MASTECTOMY WITH AXILLARY SENTINEL LYMPH NODE BIOPSY -- RF guided;  Surgeon: Herbert Pun,  MD;  Location: [facility] ORS;  Service: General;  Laterality: Right;   PORTACATH PLACEMENT N/A 03/01/2022   Procedure: INSERTION PORT-A-CATH;  Surgeon: Herbert Pun, MD;  Location: [facility] ORS;  Service: General;  Laterality: N/A;    Social History   Socioeconomic History   Marital status: Married    Spouse name: Not on file   Number of children: Not on file   Years of education: Not on file   Highest education level: Not on file  Occupational History   Not on file  Tobacco Use   Smoking status: Never   Smokeless tobacco: Never  Vaping Use   Vaping Use: Never used  Substance and Sexual Activity   Alcohol use: Never   Drug use: Never   Sexual activity: Not on file  Other Topics Concern   Not on file  Social History Narrative   Not on file   Social Determinants of Health   Financial Resource Strain: Low Risk  (02/24/2022)   Overall Financial Resource Strain (CARDIA)    Difficulty of Paying Living Expenses: Not very hard  Food Insecurity: No Food Insecurity (02/24/2022)   Hunger Vital Sign    Worried About Running Out of Food in the Last Year: Never true    Ran Out of Food in the Last Year: Never true  Transportation Needs: No Transportation Needs (02/24/2022)   PRAPARE - Hydrologist (Medical): No    Lack of Transportation (Non-Medical): No  Physical Activity: Inactive (02/24/2022)   Exercise Vital Sign    Days of Exercise per Week: 0 days    Minutes of Exercise per Session: 0 min  Stress: No Stress Concern Present (02/24/2022)   [place]    Feeling of Stress : Only a little  Social Connections: Moderately Integrated (02/24/2022)   Social Connection and Isolation Panel [NHANES]    Frequency of Communication with Friends and Family: Three times a week    Frequency of Social Gatherings with Friends and Family: Three times a week    Attends Religious Services: 1 to 4 times per  year    Active Member of Clubs or Organizations: No    Attends Archivist Meetings: Never    Marital Status: Married  Human resources officer Violence: Not At Risk (02/24/2022)   Humiliation, Afraid, Rape, and Kick questionnaire    Fear of Current or Ex-Partner: No    Emotionally Abused: No    Physically Abused: No    Sexually Abused: No    Family History  Problem Relation Age of Onset   Heart disease Father    Breast cancer Sister        32s   Prostate cancer Paternal Grandfather    Stomach cancer Cousin      Current Outpatient Medications:    atorvastatin (LIPITOR) 80 MG tablet, Take 80 mg by mouth at bedtime., Disp: , Rfl:    DULoxetine (CYMBALTA) 60 MG capsule, Take 60 mg by mouth at bedtime., Disp: , Rfl:    enalapril (VASOTEC) 20 MG tablet, Take 20 mg by  mouth every morning., Disp: , Rfl:    fenofibrate (TRICOR) 145 MG tablet, Take 145 mg by mouth every morning., Disp: , Rfl:    fluticasone (FLONASE) 50 MCG/ACT nasal spray, Place 2 sprays into both nostrils daily as needed for allergies or rhinitis., Disp: , Rfl:    folic acid (FOLVITE) 1 MG tablet, Take 1 tablet (1 mg total) by mouth daily., Disp: 30 tablet, Rfl: 3   gabapentin (NEURONTIN) 300 MG capsule, Take 300 mg by mouth in the morning and at bedtime., Disp: , Rfl:    glipiZIDE (GLUCOTROL XL) 2.5 MG 24 hr tablet, Take 2.5 mg by mouth daily with breakfast., Disp: , Rfl:    hydrochlorothiazide (MICROZIDE) 12.5 MG capsule, Take 12.5 mg by mouth every morning., Disp: , Rfl:    Melatonin 5 MG CAPS, Take 5 mg by mouth as needed., Disp: , Rfl:    metoprolol succinate (TOPROL XL) 25 MG 24 hr tablet, Take 1 tablet (25 mg total) by mouth daily., Disp: 90 tablet, Rfl: 3   vitamin C (ASCORBIC ACID) 500 MG tablet, Take 500 mg by mouth daily., Disp: , Rfl:    aspirin 81 MG EC tablet, Take 1 tablet by mouth daily. (Patient not taking: Reported on 05/15/2022), Disp: , Rfl:    meloxicam (MOBIC) 15 MG tablet, Take 15 mg by mouth daily  as needed for pain. (Patient not taking: Reported on 07/31/2022), Disp: , Rfl:    tiZANidine (ZANAFLEX) 4 MG tablet, Take 4 mg by mouth every 8 (eight) hours as needed for muscle spasms. (Patient not taking: Reported on 10/31/2022), Disp: , Rfl:    traMADol (ULTRAM) 50 MG tablet, Take 1 tablet (50 mg total) by mouth every 6 (six) hours as needed. (Patient not taking: Reported on 10/31/2022), Disp: 20 tablet, Rfl: 0 No current facility-administered medications for this visit.  Facility-Administered Medications Ordered in Other Visits:    sodium chloride flush (NS) 0.9 % injection 10 mL, 10 mL, Intracatheter, PRN, Sindy Guadeloupe, MD, 10 mL at 10/31/22 1504  Physical exam:  Vitals:   10/31/22 1338  BP: 129/80  Pulse: 91  Resp: 18  Temp: 98.4 F (36.9 C)  TempSrc: Tympanic  SpO2: 98%  Weight: 118 lb 11.2 oz (53.8 kg)   Physical Exam Cardiovascular:     Rate and Rhythm: Normal rate and regular rhythm.     Heart sounds: Normal heart sounds.  Pulmonary:     Effort: Pulmonary effort is normal.     Breath sounds: Normal breath sounds.  Skin:    General: Skin is warm and dry.  Neurological:     Mental Status: She is alert and oriented to person, place, and time.         Latest Ref Rng & Units 10/31/2022    1:12 PM  CMP  Glucose 70 - 99 mg/dL 246   BUN 8 - 23 mg/dL 26   Creatinine 0.44 - 1.00 mg/dL 0.76   Sodium 135 - 145 mmol/L 133   Potassium 3.5 - 5.1 mmol/L 4.0   Chloride 98 - 111 mmol/L 99   CO2 22 - 32 mmol/L 25   Calcium 8.9 - 10.3 mg/dL 9.3   Total Protein 6.5 - 8.1 g/dL 7.1   Total Bilirubin 0.3 - 1.2 mg/dL 0.4   Alkaline Phos 38 - 126 U/L 62   AST 15 - 41 U/L 27   ALT 0 - 44 U/L 24       Latest Ref Rng & Units 10/31/2022  1:12 PM  CBC  WBC 4.0 - 10.5 K/uL 6.7   Hemoglobin 12.0 - 15.0 g/dL 12.1   Hematocrit 36.0 - 46.0 % 36.7   Platelets 150 - 400 K/uL 291     No images are attached to the encounter.  No results found.   Assessment and plan- Patient  is a 66 y.o. female with triple negative breast cancer stage IIb and cT2 N0 M0.  She is s/p neoadjuvant chemotherapy as per keynote 522 regimen.  She had right mastectomy with sentinel lymph node biopsy which showed a pathological complete response.  She did not require adjuvant radiation therapy.  She is here for on treatment assessment prior to cycle 3 of adjuvant Keytruda  Counts okay to proceed with cycle 3 of adjuvant Keytruda today.  I will see him back in 3 weeks for cycle 4.  Plan is to complete 9 cycles.   Visit Diagnosis 1. Malignant neoplasm of lower-inner quadrant of right breast of female, estrogen receptor negative ([place])   2. Encounter for antineoplastic immunotherapy      Dr. Randa Evens, MD, MPH [facility] at [facility] [phone] 10/31/2022 6:55 PM

## 2022-10-31 NOTE — Patient Instructions (Signed)
Instrucciones al darle de alta: Discharge Instructions Gracias por elegir al [facility] de Cncer de [place] para brindarle atencin mdica de oncologa y Music therapist.   Si usted tiene una cita de laboratorio con [place], por favor vaya directamente [place] y regstrese en el rea de Control and instrumentation engineer.   Use ropa cmoda y [place] para tener fcil acceso a las vas del Portacath (acceso venoso de Engineer, site duracin) o la lnea PICC (catter central colocado por va perifrica).   Nos esforzamos por ofrecerle tiempo de calidad con su proveedor. Es posible que tenga que volver a programar su cita si llega tarde (15 minutos o ms).  El llegar tarde le afecta a usted y a otros pacientes cuyas citas son posteriores a Merchandiser, retail.  Adems, si usted falta a tres o ms citas sin avisar a la oficina, puede ser retirado(a) de la clnica a discrecin del proveedor.      Para las solicitudes de renovacin de recetas, pida a su farmacia que se ponga en contacto con nuestra oficina y deje que transcurran 19 horas para que se complete el proceso de las renovaciones.    Hoy usted recibi los siguientes agentes de quimioterapia e/o inmunoterapia- Keytruda      Para ayudar a prevenir las nuseas y los vmitos despus de su tratamiento, le recomendamos que tome su medicamento para las nuseas segn las indicaciones.  LOS SNTOMAS QUE DEBEN COMUNICARSE INMEDIATAMENTE SE INDICAN A CONTINUACIN: *FIEBRE SUPERIOR A 100.4 F (38 C) O MS *ESCALOFROS O SUDORACIN *NUSEAS Y VMITOS QUE NO SE CONTROLAN CON EL MEDICAMENTO PARA LAS NUSEAS *DIFICULTAD INUSUAL PARA RESPIRAR  *MORETONES O HEMORRAGIAS NO HABITUALES *PROBLEMAS URINARIOS (dolor o ardor al Garment/textile technologist o frecuencia para Garment/textile technologist) *PROBLEMAS INTESTINALES (diarrea inusual, estreimiento, dolor cerca del ano) SENSIBILIDAD EN LA BOCA Y EN LA GARGANTA CON O SIN LA PRESENCIA DE LCERAS (dolor de garganta, llagas en la boca o dolor de muelas/dientes) ERUPCIN,  HINCHAZN O DOLORES INUSUALES FLUJO VAGINAL INUSUAL O PICAZN/RASQUIA    Los puntos marcados con un asterisco ( *) indican una posible emergencia y debe hacer un seguimiento tan pronto como le sea posible o vaya al Departamento de Emergencias si se le presenta algn problema.  Por favor, muestre la [place] DE ADVERTENCIA DE Windy Canny DE ADVERTENCIA DE Benay Spice al registrarse en [address] de Emergencias y a la enfermera de triaje.  Si tiene preguntas despus de su visita o necesita cancelar o volver a programar su cita, por favor pngase en contacto con [facility] [place]  [phone]  y [place] instrucciones. Las horas de oficina son de 8:00 a.m. a 4:30 p.m. de lunes a viernes. Por favor, tenga en cuenta que los mensajes de voz que se dejan despus de las 4:00 p.m. posiblemente no se devolvern hasta el siguiente da de [place].  Cerramos los fines de semana y [employer]. En todo momento tiene acceso a una enfermera para preguntas urgentes. Por favor, llame al nmero principal de la clnica  [phone] y [place] instrucciones.   Para cualquier pregunta que no sea de carcter urgente, tambin puede ponerse en contacto con su proveedor [employer] Ahora ofrecemos visitas electrnicas para cualquier persona mayor de 18 aos que solicite atencin mdica en lnea para los sntomas que no sean urgentes. Para ms detalles vaya a [email or web address].   Tambin puede bajar la aplicacin de MyChart! Vaya a la tienda de aplicaciones, busque "MyChart", abra la aplicacin, seleccione  [place], e ingrese con su nombre de usuario y la contrasea de Pharmacist, community.  Las mscaras son opcionales en los centros de Hotel manager. Si desea que su equipo de cuidados mdicos use una [employer] atienden, por favor hgaselo saber al personal. Bethann Berkshire una persona de apoyo que tenga por lo menos 16 aos para que le acompae a sus  citas.

## 2022-10-31 NOTE — Progress Notes (Signed)
Pt and daughter in for follow up.  Pt reports having cough, wheezing and nasal congestion for 2 weeks.

## 2022-11-01 ENCOUNTER — Other Ambulatory Visit: Payer: Self-pay | Admitting: *Deleted

## 2022-11-01 MED ORDER — ALBUTEROL SULFATE HFA 108 (90 BASE) MCG/ACT IN AERS: 2.0000 | INHALATION_SPRAY | Freq: Four times a day (QID) | RESPIRATORY_TRACT | 2 refills | Status: DC | PRN

## 2022-11-02 ENCOUNTER — Other Ambulatory Visit: Payer: Self-pay

## 2022-11-06 ENCOUNTER — Other Ambulatory Visit: Payer: Self-pay

## 2022-11-15 ENCOUNTER — Ambulatory Visit: Payer: Medicare HMO

## 2022-11-15 ENCOUNTER — Other Ambulatory Visit: Payer: Medicare HMO

## 2022-11-15 ENCOUNTER — Ambulatory Visit: Payer: Medicare HMO | Admitting: Oncology

## 2022-11-21 ENCOUNTER — Ambulatory Visit
Admission: RE | Admit: 2022-11-21 | Discharge: 2022-11-21 | Disposition: A | Discharge status: Home / Self Care | Payer: Medicare HMO | Source: Ambulatory Visit | Attending: Radiation Oncology | Admitting: Radiation Oncology

## 2022-11-21 ENCOUNTER — Encounter: Payer: Self-pay | Admitting: Oncology

## 2022-11-21 ENCOUNTER — Inpatient Hospital Stay: Payer: Medicare HMO

## 2022-11-21 ENCOUNTER — Inpatient Hospital Stay (HOSPITAL_BASED_OUTPATIENT_CLINIC_OR_DEPARTMENT_OTHER): Payer: Medicare HMO | Admitting: Oncology

## 2022-11-21 ENCOUNTER — Inpatient Hospital Stay: Payer: Medicare HMO | Attending: Oncology

## 2022-11-21 VITALS — BP 141/76 | HR 82 | Temp 98.8°F | Resp 16 | Ht 60.0 in | Wt 119.8 lb

## 2022-11-21 DIAGNOSIS — Z5112 Encounter for antineoplastic immunotherapy: Secondary | ICD-10-CM | POA: Insufficient documentation

## 2022-11-21 DIAGNOSIS — Z79899 Other long term (current) drug therapy: Secondary | ICD-10-CM | POA: Diagnosis not present

## 2022-11-21 DIAGNOSIS — Z171 Estrogen receptor negative status [ER-]: Secondary | ICD-10-CM

## 2022-11-21 DIAGNOSIS — C50311 Malignant neoplasm of lower-inner quadrant of right female breast: Secondary | ICD-10-CM

## 2022-11-21 DIAGNOSIS — R058 Other specified cough: Secondary | ICD-10-CM | POA: Diagnosis not present

## 2022-11-21 DIAGNOSIS — C50312 Malignant neoplasm of lower-inner quadrant of left female breast: Secondary | ICD-10-CM | POA: Insufficient documentation

## 2022-11-21 LAB — COMPREHENSIVE METABOLIC PANEL
ALT: 27 U/L (ref 0–44)
AST: 27 U/L (ref 15–41)
Albumin: 4.1 g/dL (ref 3.5–5.0)
Alkaline Phosphatase: 53 U/L (ref 38–126)
Anion gap: 10 (ref 5–15)
BUN: 27 mg/dL — ABNORMAL HIGH (ref 8–23)
CO2: 26 mmol/L (ref 22–32)
Calcium: 8.9 mg/dL (ref 8.9–10.3)
Chloride: 100 mmol/L (ref 98–111)
Creatinine, Ser: 0.68 mg/dL (ref 0.44–1.00)
GFR, Estimated: >60 mL/min (ref >60)
Glucose, Bld: 184 mg/dL — ABNORMAL HIGH (ref 70–99)
Potassium: 3.7 mmol/L (ref 3.5–5.1)
Sodium: 136 mmol/L (ref 135–145)
Total Bilirubin: 0.4 mg/dL (ref 0.3–1.2)
Total Protein: 7 g/dL (ref 6.5–8.1)

## 2022-11-21 LAB — CBC WITH DIFFERENTIAL/PLATELET
Abs Immature Granulocytes: 0.01 10*3/uL (ref 0.00–0.07)
Basophils Absolute: 0 10*3/uL (ref 0.0–0.1)
Basophils Relative: 1 %
Eosinophils Absolute: 1 10*3/uL — ABNORMAL HIGH (ref 0.0–0.5)
Eosinophils Relative: 17 %
HCT: 35 % — ABNORMAL LOW (ref 36.0–46.0)
Hemoglobin: 11.8 g/dL — ABNORMAL LOW (ref 12.0–15.0)
Immature Granulocytes: 0 %
Lymphocytes Relative: 19 %
Lymphs Abs: 1.2 10*3/uL (ref 0.7–4.0)
MCH: 32.4 pg (ref 26.0–34.0)
MCHC: 33.7 g/dL (ref 30.0–36.0)
MCV: 96.2 fL (ref 80.0–100.0)
Monocytes Absolute: 0.4 10*3/uL (ref 0.1–1.0)
Monocytes Relative: 7 %
Neutro Abs: 3.4 10*3/uL (ref 1.7–7.7)
Neutrophils Relative %: 56 %
Platelets: 249 10*3/uL (ref 150–400)
RBC: 3.64 MIL/uL — ABNORMAL LOW (ref 3.87–5.11)
RDW: 13.1 % (ref 11.5–15.5)
WBC: 6.1 10*3/uL (ref 4.0–10.5)
nRBC: 0 % (ref 0.0–0.2)

## 2022-11-21 MED ORDER — BENZONATATE 200 MG PO CAPS: 200.0000 mg | ORAL_CAPSULE | Freq: Three times a day (TID) | ORAL | 0 refills | Status: DC | PRN

## 2022-11-21 MED ORDER — SODIUM CHLORIDE 0.9 % IV SOLN
Freq: Once | INTRAVENOUS | Status: AC
  Filled 2022-11-21: qty 250

## 2022-11-21 MED ORDER — HEPARIN SOD (PORK) LOCK FLUSH 100 UNIT/ML IV SOLN
500.0000 [IU] | Freq: Once | INTRAVENOUS | Status: AC | PRN
  Administered 2022-11-21: 500 [IU]
  Filled 2022-11-21: qty 5

## 2022-11-21 MED ORDER — SODIUM CHLORIDE 0.9 % IV SOLN
200.0000 mg | Freq: Once | INTRAVENOUS | Status: AC
  Administered 2022-11-21: 200 mg via INTRAVENOUS
  Filled 2022-11-21: qty 8

## 2022-11-21 NOTE — Patient Instructions (Signed)
Instrucciones al darle de alta: Discharge Instructions Gracias por elegir al [facility] de Cncer de [place] para brindarle atencin mdica de oncologa y Music therapist.   Si usted tiene una cita de laboratorio con [place], por favor vaya directamente [place] y regstrese en el rea de Control and instrumentation engineer.   Use ropa cmoda y [place] para tener fcil acceso a las vas del Portacath (acceso venoso de Engineer, site duracin) o la lnea PICC (catter central colocado por va perifrica).   Nos esforzamos por ofrecerle tiempo de calidad con su proveedor. Es posible que tenga que volver a programar su cita si llega tarde (15 minutos o ms).  El llegar tarde le afecta a usted y a otros pacientes cuyas citas son posteriores a Merchandiser, retail.  Adems, si usted falta a tres o ms citas sin avisar a la oficina, puede ser retirado(a) de la clnica a discrecin del proveedor.      Para las solicitudes de renovacin de recetas, pida a su farmacia que se ponga en contacto con nuestra oficina y deje que transcurran 49 horas para que se complete el proceso de las renovaciones.    Hoy usted recibi los siguientes agentes de quimioterapia e/o inmunoterapia KEYTRUDA      Para ayudar a prevenir las nuseas y los vmitos despus de su tratamiento, le recomendamos que tome su medicamento para las nuseas segn las indicaciones.  LOS SNTOMAS QUE DEBEN COMUNICARSE INMEDIATAMENTE SE INDICAN A CONTINUACIN: *FIEBRE SUPERIOR A 100.4 F (38 C) O MS *ESCALOFROS O SUDORACIN *NUSEAS Y VMITOS QUE NO SE CONTROLAN CON EL MEDICAMENTO PARA LAS NUSEAS *DIFICULTAD INUSUAL PARA RESPIRAR  *MORETONES O HEMORRAGIAS NO HABITUALES *PROBLEMAS URINARIOS (dolor o ardor al Garment/textile technologist o frecuencia para Garment/textile technologist) *PROBLEMAS INTESTINALES (diarrea inusual, estreimiento, dolor cerca del ano) SENSIBILIDAD EN LA BOCA Y EN LA GARGANTA CON O SIN LA PRESENCIA DE LCERAS (dolor de garganta, llagas en la boca o dolor de muelas/dientes) ERUPCIN,  HINCHAZN O DOLORES INUSUALES FLUJO VAGINAL INUSUAL O PICAZN/RASQUIA    Los puntos marcados con un asterisco ( *) indican una posible emergencia y debe hacer un seguimiento tan pronto como le sea posible o vaya al Departamento de Emergencias si se le presenta algn problema.  Por favor, muestre la [facility] DE ADVERTENCIA DE Windy Canny DE ADVERTENCIA DE Benay Spice al registrarse en [address] de Emergencias y a la enfermera de triaje.  Si tiene preguntas despus de su visita o necesita cancelar o volver a programar su cita, por favor pngase en contacto con [facility] [place]  [phone]  y [place] instrucciones. Las horas de oficina son de 8:00 a.m. a 4:30 p.m. de lunes a viernes. Por favor, tenga en cuenta que los mensajes de voz que se dejan despus de las 4:00 p.m. posiblemente no se devolvern hasta el siguiente da de [place].  Cerramos los fines de semana y [employer]. En todo momento tiene acceso a una enfermera para preguntas urgentes. Por favor, llame al nmero principal de la clnica  [phone] y [place] instrucciones.   Para cualquier pregunta que no sea de carcter urgente, tambin puede ponerse en contacto con su proveedor [employer] Ahora ofrecemos visitas electrnicas para cualquier persona mayor de 18 aos que solicite atencin mdica en lnea para los sntomas que no sean urgentes. Para ms detalles vaya a [email or web address].   Tambin puede bajar la aplicacin de MyChart! Vaya a la tienda de aplicaciones, busque "MyChart", abra la aplicacin, seleccione  [place], e ingrese con su nombre de usuario y la contrasea de Pharmacist, community.  Las mscaras son opcionales en los centros de Hotel manager. Si desea que su equipo de cuidados mdicos use una [employer] atienden, por favor hgaselo saber al personal. Bethann Berkshire una persona de apoyo que tenga por lo menos 16 aos para que le acompae a sus  citas. Pembrolizumab Injection Qu es este medicamento? El PEMBROLIZUMAB es un anticuerpo monoclonal. [place] para tratar ciertos tipos de cncer. Este medicamento puede ser utilizado para otros usos; si tiene alguna pregunta consulte con su proveedor de atencin mdica o con su farmacutico. MARCAS COMUNES: Keytruda Qu le debo informar a mi profesional de la salud antes de tomar este medicamento? Necesitan saber si usted presenta alguno de los siguientes problemas o situaciones: enfermedades autoinmunes tales como enfermedad de Crohn, colitis ulcerativa o lupus ha tenido o planea tener un trasplante alognico de Social research officer, government ([place] las clulas madre de Theatre manager persona) antecedentes de trasplante de rganos antecedentes de radiacin en el pecho problemas del sistema nervioso, tales como miastenia grave o sndrome de Curator una reaccin alrgica o inusual al pembrolizumab, a otros medicamentos, alimentos, colorantes o conservantes si est embarazada o buscando quedar embarazada si est amamantando a un beb Cmo debo utilizar este medicamento? Este medicamento se administra mediante infusin en una vena. Lo administra un profesional de Technical sales engineer en un hospital o en un entorno clnico. Se le entregar una Gua del medicamento (MedGuide, su nombre en ingls) especial antes de cada tratamiento. Asegrese de leer esta informacin cada vez cuidadosamente. Hable con su pediatra para informarse acerca del uso de este medicamento en nios. Aunque este medicamento se puede recetar a nios tan pequeos como de 6 meses de edad con ciertas afecciones, existen precauciones que deben tomarse. Sobredosis: Pngase en contacto inmediatamente con un centro toxicolgico o una sala de urgencia si usted cree que haya tomado demasiado medicamento. ATENCIN: [employer] es solo para usted. No comparta este medicamento con nadie. Qu sucede si me olvido de una dosis? Es importante no olvidar ninguna dosis.  Informe a su mdico o a su profesional de la salud si no puede asistir a Photographer. Qu puede interactuar con este medicamento? No se han estudiado las interacciones. Puede ser que esta lista no menciona todas las posibles interacciones. Informe a su profesional de [employer]	[place] de [employer] productos a base de hierbas, medicamentos de [place] o suplementos nutritivos que est tomando. Si usted fuma, consume bebidas alcohlicas o si utiliza drogas ilegales, indqueselo tambin a su profesional de [employer]	[place]. Algunas sustancias pueden interactuar con su medicamento. A qu debo estar atento al usar [employer]? Se supervisar su estado de salud atentamente mientras reciba este medicamento. Usted podra necesitar realizarse [employer] de sangre mientras est usando [address]. No debe quedar embarazada mientras est usando este medicamento o por 4 meses despus de dejar de usarlo. Las mujeres deben informar a su mdico si estn buscando quedar embarazadas o si creen que podran estar embarazadas. Existe la posibilidad de efectos secundarios graves en un beb sin nacer. Para obtener ms informacin, hable con su profesional de la salud o su farmacutico. No debe amamantar a un beb mientras est usando este medicamento o durante 4 meses despus de la ltima dosis. Qu efectos secundarios puedo tener al [employer] este medicamento? Efectos secundarios que debe informar a su mdico o a Barrister's clerk de la salud tan pronto como sea posible: Chief of Staff, tales como erupcin  cutnea, comezn/picazn o urticaria, e hinchazn de la cara, los labios o la lengua con sangre o de color negro y aspecto alquitranado problemas para respirar cambios en la visin dolor en el pecho escalofros confusin estreimiento tos diarrea mareo, sensacin de desmayo o aturdimiento frecuencia cardiaca rpida o irregular fiebre enrojecimiento dolor en las articulaciones recuentos sanguneos bajos: este  medicamento podra reducir la cantidad de glbulos blancos, glbulos rojos y plaquetas. Su riesgo de infeccin y sangrado podra ser mayor. dolor muscular debilidad muscular dolor, hormigueo o entumecimiento de las manos o los pies dolor de cabeza persistente enrojecimiento, formacin de ampollas, descamacin o distensin de la piel, incluso dentro de la boca signos y sntomas de niveles elevados de azcar en la sangre, tales como mareo; boca seca; piel seca; aliento frutal; nuseas; dolor de estmago; aumento del apetito o la sed; aumento de la frecuencia urinaria signos y sntomas de lesin renal, tales como dificultad para Garment/textile technologist o cambios en la cantidad de orina signos y sntomas de lesin en el hgado, como orina oscura, heces claras, prdida del apetito, nuseas, dolor en la regin abdominal superior derecha, color amarillento de los ojos o la piel sudoracin ganglios linfticos inflamados prdida de peso Efectos secundarios que generalmente no requieren atencin mdica (infrmelos a su mdico o a su profesional de la salud si persisten o si son molestos): disminucin del apetito cada del cabello cansancio Puede ser que esta lista no menciona todos los posibles efectos secundarios. Comunquese a su mdico por asesoramiento mdico [employer]. Usted puede informar los efectos secundarios a la FDA por telfono al 1-800-FDA-1088. Dnde debo guardar mi medicina? Este medicamento se administra en hospitales o clnicas, y no necesitar guardarlo en su domicilio. ATENCIN: Este folleto es un resumen. Puede ser que no cubra toda la posible informacin. Si usted tiene preguntas acerca de esta medicina, consulte con su mdico, su farmacutico o su profesional de Technical sales engineer.  2023 Elsevier/Gold Standard (2020-03-25 00:00:00)

## 2022-11-21 NOTE — Progress Notes (Signed)
Cough/wheeze has not improved that worsens later in the day.

## 2022-11-21 NOTE — Consult Note (Signed)
NEW PATIENT EVALUATION  Name: Susan [name]  MRN: [id number]  Date:   11/21/2022     DOB: 09-Jun-1956   This 66 y.o. 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  REFERRING PHYSICIAN: Frazier Richards, MD  CHIEF COMPLAINT: No chief complaint on file.   DIAGNOSIS: The encounter diagnosis was Malignant neoplasm of lower-inner quadrant of right breast of female, estrogen receptor negative ([place]).   PREVIOUS INVESTIGATIONS:  Mammograms and MRI scans PET scans all reviewed Pathology report reviewed Clinical notes reviewed  HPI: Patient is a 66 year old female who had self palpated a right breast mass in February 23.  Diagnostic mammogram showed a mass at the 5:30 position of the right breast 4 cm from the nipple measuring 1.8 cm in greatest dimension.  There is overlying skin thickening.  No axillary adenopathy was noted.  Biopsy was positive and showed grade 3 triple negative invasive mammary carcinoma.  MRI of the breast confirmed 3 cm mass in the right breast at the 5:30 position.  There is overall skin thickening with malignancy extending to the skin with mild tethering.  No evidence of disease in the left breast and no adenopathy was detected.  PET scan also demonstrated hyper hypermetabolic 2 cm right breast mass consistent with no neoplasm.  No evidence of metastatic disease or evidence of axillary or supraclavicular adenopathy on PET/CT was identified.  Patient underwent neoadjuvant chemotherapy with the keynote 522 regimen including CarboTaxol Keytruda.  She underwent a wide local excision showing a complete pathologic response.  She has been on maintenance Keytruda I been asked to evaluate her for adjuvant radiation therapy.  She is doing well specifically denies breast tenderness cough or bone  pain.  Her exam a an interpreter was present.  PLANNED TREATMENT REGIMEN: Hypofractionated right whole breast radiation  PAST MEDICAL HISTORY:  has a past medical history of Breast cancer, right breast ([place]), Diabetes mellitus without complication ([place]), Family history of prostate cancer, Gastritis, Heart murmur, History of chemotherapy, History of pancreatitis, Hyperlipidemia, Hypertension, Palpitations, Port-A-Cath in place, and Vitamin B12 deficiency.    PAST SURGICAL HISTORY:  Past Surgical History:  Procedure Laterality Date   BREAST BIOPSY Right 11/2015   benign   BREAST BIOPSY Right 01/24/2022   INVASIVE MAMMARY CARCINOMA, NO SPECIAL TYPE   BREAST CYST EXCISION Right    CESAREAN SECTION     2x   CHOLECYSTECTOMY     IR CV LINE INJECTION  03/28/2022   PARTIAL MASTECTOMY WITH AXILLARY SENTINEL LYMPH NODE BIOPSY Right 08/16/2022   Procedure: PARTIAL MASTECTOMY WITH AXILLARY SENTINEL LYMPH NODE BIOPSY -- RF guided;  Surgeon: Herbert Pun, MD;  Location: [facility] ORS;  Service: General;  Laterality: Right;   PORTACATH PLACEMENT N/A 03/01/2022   Procedure: INSERTION PORT-A-CATH;  Surgeon: Herbert Pun, MD;  Location: [facility] ORS;  Service: General;  Laterality: N/A;    FAMILY HISTORY: family history includes Breast cancer in her sister; Heart disease in her father; Prostate cancer in her paternal grandfather; Stomach cancer in her cousin.  SOCIAL HISTORY:  reports that she has never smoked. She has never used smokeless tobacco. She reports that she does not drink alcohol and does not use drugs.  ALLERGIES: Contrast media  [iodinated contrast media]  MEDICATIONS:  Current Outpatient Medications  Medication Sig Dispense Refill   albuterol (VENTOLIN HFA) 108 (  90 Base) MCG/ACT inhaler Inhale 2 puffs into the lungs every 6 (six) hours as needed for wheezing or shortness of breath. 8 g 2   aspirin 81 MG EC tablet Take 1 tablet by mouth daily. (Patient not taking: Reported on  05/15/2022)     atorvastatin (LIPITOR) 80 MG tablet Take 80 mg by mouth at bedtime.     benzonatate (TESSALON) 200 MG capsule Take 1 capsule (200 mg total) by mouth 3 (three) times daily as needed for cough. 20 capsule 0   DULoxetine (CYMBALTA) 60 MG capsule Take 60 mg by mouth at bedtime.     enalapril (VASOTEC) 20 MG tablet Take 20 mg by mouth every morning.     fenofibrate (TRICOR) 145 MG tablet Take 145 mg by mouth every morning.     fluticasone (FLONASE) 50 MCG/ACT nasal spray Place 2 sprays into both nostrils daily as needed for allergies or rhinitis.     folic acid (FOLVITE) 1 MG tablet Take 1 tablet (1 mg total) by mouth daily. 30 tablet 3   gabapentin (NEURONTIN) 300 MG capsule Take 300 mg by mouth in the morning and at bedtime.     glipiZIDE (GLUCOTROL XL) 2.5 MG 24 hr tablet Take 2.5 mg by mouth daily with breakfast.     hydrochlorothiazide (MICROZIDE) 12.5 MG capsule Take 12.5 mg by mouth every morning.     Melatonin 5 MG CAPS Take 5 mg by mouth as needed.     meloxicam (MOBIC) 15 MG tablet Take 15 mg by mouth daily as needed for pain. (Patient not taking: Reported on 07/31/2022)     metoprolol succinate (TOPROL XL) 25 MG 24 hr tablet Take 1 tablet (25 mg total) by mouth daily. 90 tablet 3   tiZANidine (ZANAFLEX) 4 MG tablet Take 4 mg by mouth every 8 (eight) hours as needed for muscle spasms. (Patient not taking: Reported on 10/31/2022)     traMADol (ULTRAM) 50 MG tablet Take 1 tablet (50 mg total) by mouth every 6 (six) hours as needed. (Patient not taking: Reported on 10/31/2022) 20 tablet 0   vitamin C (ASCORBIC ACID) 500 MG tablet Take 500 mg by mouth daily.     No current facility-administered medications for this encounter.    ECOG PERFORMANCE STATUS:  0 - Asymptomatic  REVIEW OF SYSTEMS: Patient denies any weight loss, fatigue, weakness, fever, chills or night sweats. Patient denies any loss of vision, blurred vision. Patient denies any ringing  of the ears or hearing loss.  No irregular heartbeat. Patient denies heart murmur or history of fainting. Patient denies any chest pain or pain radiating to her upper extremities. Patient denies any shortness of breath, difficulty breathing at night, cough or hemoptysis. Patient denies any swelling in the lower legs. Patient denies any nausea vomiting, vomiting of blood, or coffee ground material in the vomitus. Patient denies any stomach pain. Patient states has had normal bowel movements no significant constipation or diarrhea. Patient denies any dysuria, hematuria or significant nocturia. Patient denies any problems walking, swelling in the joints or loss of balance. Patient denies any skin changes, loss of hair or loss of weight. Patient denies any excessive worrying or anxiety or significant depression. Patient denies any problems with insomnia. Patient denies excessive thirst, polyuria, polydipsia. Patient denies any swollen glands, patient denies easy bruising or easy bleeding. Patient denies any recent infections, allergies or URI. Patient "s visual fields have not changed significantly in recent time.   PHYSICAL EXAM: There were no vitals  taken for this visit. Patient status post wide local excision of the right breast incisions well-healed no dominant masses noted in either breast.  No axillary or supraclavicular adenopathy is identified.  Well-developed well-nourished patient in NAD. HEENT reveals PERLA, EOMI, discs not visualized.  Oral cavity is clear. No oral mucosal lesions are identified. Neck is clear without evidence of cervical or supraclavicular adenopathy. Lungs are clear to A&P. Cardiac examination is essentially unremarkable with regular rate and rhythm without murmur rub or thrill. Abdomen is benign with no organomegaly or masses noted. Motor sensory and DTR levels are equal and symmetric in the upper and lower extremities. Cranial nerves II through XII are grossly intact. Proprioception is intact. No peripheral  adenopathy or edema is identified. No motor or sensory levels are noted. Crude visual fields are within normal range.  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  Risks and benefits of treatment including skin reaction fatigue alteration of blood counts possible inclusion of superficial lung all were reviewed in detail with the patient through our interpreter.  I have set her up for CT simulation tomorrow.  Patient comprehends my recommendations well.  I would like to take this opportunity to thank you for allowing me to participate in the care of your patient.Noreene Filbert, MD

## 2022-11-21 NOTE — Progress Notes (Signed)
Hematology/Oncology Consult note [facility]  [phone] [phone]  Patient Care Team: Frazier Richards, MD as PCP - General (Family Medicine) Kate Sable, MD as PCP - Cardiology (Cardiology) Theodore Demark, RN (Inactive) as Oncology Nurse Navigator Sindy Guadeloupe, MD as Consulting Physician (Oncology)   Name of the patient: Susan [name]  [id number]  1956-06-14   Date of visit: 11/21/22  Diagnosis- clinical prognostic stage IIb invasive mammary carcinoma of the right breast cT2 N0 M0 triple negative    Chief complaint/ Reason for visit-on treatment assessment prior to cycle 4 of adjuvant Keytruda  Heme/Onc history: Patient is a 66 year old female who self palpated a right breast lump in February 2023 that led to a diagnostic right breast mammogram.  It showed a suspicious mass at the 5:30 position of the right breast 4 cm from the nipple measuring 1.8 x 1.6 x 1.2 cm.  There may be a thickened echogenic rim which would bring the measurement to 2.9 x 1.9 x 2.3 cm.  Overlying skin thickening.  No axillary adenopathy.  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      Patient completed neoadjuvant intent with keynote 522 regimen starting with CarboTaxol Keytruda chemotherapy which she completed in August 2023.  Patient then underwent mastectomy with sentinel lymph node biopsy which showed a pathological complete response.  She is currently receiving adjuvant Keytruda  Interval history-patient has been having dry nonproductive cough for the last couple of months.  Denies any exertional shortness of breath fever or sputum production.  Occasional wheezing for which she was given as needed albuterol but does not think that has helped much.  She has tried over-the-counter Robitussin for her cough as well.  ECOG PS- 1 Pain scale- 0   Review of systems- Review  of Systems  Constitutional:  Negative for chills, fever, malaise/fatigue and weight loss.  HENT:  Negative for congestion, ear discharge and nosebleeds.   Eyes:  Negative for blurred vision.  Respiratory:  Positive for cough. Negative for hemoptysis, sputum production, shortness of breath and wheezing.   Cardiovascular:  Negative for chest pain, palpitations, orthopnea and claudication.  Gastrointestinal:  Negative for abdominal pain, blood in stool, constipation, diarrhea, heartburn, melena, nausea and vomiting.  Genitourinary:  Negative for dysuria, flank pain, frequency, hematuria and urgency.  Musculoskeletal:  Negative for back pain, joint pain and myalgias.  Skin:  Negative for rash.  Neurological:  Negative for dizziness, tingling, focal weakness, seizures, weakness and headaches.  Endo/Heme/Allergies:  Does not bruise/bleed easily.  Psychiatric/Behavioral:  Negative for depression and suicidal ideas. The patient does not have insomnia.       Allergies  Allergen Reactions   Contrast Media  [Iodinated Contrast Media] Nausea And Vomiting and Other (See Comments)    Pt was pre medicated 03/28/22 and 3 ML of contrast injected per [facility] A Cath check.  Pt reported no problems.      Past Medical History:  Diagnosis Date   Breast cancer, right breast ([place])    Diabetes mellitus without complication ([place])    Family history of prostate cancer    Gastritis    Heart murmur    History of chemotherapy    History of pancreatitis    Hyperlipidemia    Hypertension    Palpitations    Port-A-Cath in place    Vitamin B12 deficiency      Past  Surgical History:  Procedure Laterality Date   BREAST BIOPSY Right 11/2015   benign   BREAST BIOPSY Right 01/24/2022   INVASIVE MAMMARY CARCINOMA, NO SPECIAL TYPE   BREAST CYST EXCISION Right    CESAREAN SECTION     2x   CHOLECYSTECTOMY     IR CV LINE INJECTION  03/28/2022   PARTIAL MASTECTOMY WITH AXILLARY SENTINEL LYMPH NODE BIOPSY Right  08/16/2022   Procedure: PARTIAL MASTECTOMY WITH AXILLARY SENTINEL LYMPH NODE BIOPSY -- RF guided;  Surgeon: Herbert Pun, MD;  Location: [facility] ORS;  Service: General;  Laterality: Right;   PORTACATH PLACEMENT N/A 03/01/2022   Procedure: INSERTION PORT-A-CATH;  Surgeon: Herbert Pun, MD;  Location: [facility] ORS;  Service: General;  Laterality: N/A;    Social History   Socioeconomic History   Marital status: Married    Spouse name: Not on file   Number of children: Not on file   Years of education: Not on file   Highest education level: Not on file  Occupational History   Not on file  Tobacco Use   Smoking status: Never   Smokeless tobacco: Never  Vaping Use   Vaping Use: Never used  Substance and Sexual Activity   Alcohol use: Never   Drug use: Never   Sexual activity: Not on file  Other Topics Concern   Not on file  Social History Narrative   Not on file   Social Determinants of Health   Financial Resource Strain: Low Risk  (02/24/2022)   Overall Financial Resource Strain (CARDIA)    Difficulty of Paying Living Expenses: Not very hard  Food Insecurity: No Food Insecurity (02/24/2022)   Hunger Vital Sign    Worried About Running Out of Food in the Last Year: Never true    Ran Out of Food in the Last Year: Never true  Transportation Needs: No Transportation Needs (02/24/2022)   PRAPARE - Hydrologist (Medical): No    Lack of Transportation (Non-Medical): No  Physical Activity: Inactive (02/24/2022)   Exercise Vital Sign    Days of Exercise per Week: 0 days    Minutes of Exercise per Session: 0 min  Stress: No Stress Concern Present (02/24/2022)   [place]    Feeling of Stress : Only a little  Social Connections: Moderately Integrated (02/24/2022)   Social Connection and Isolation Panel [NHANES]    Frequency of Communication with Friends and Family: Three times a week     Frequency of Social Gatherings with Friends and Family: Three times a week    Attends Religious Services: 1 to 4 times per year    Active Member of Clubs or Organizations: No    Attends Archivist Meetings: Never    Marital Status: Married  Human resources officer Violence: Not At Risk (02/24/2022)   Humiliation, Afraid, Rape, and Kick questionnaire    Fear of Current or Ex-Partner: No    Emotionally Abused: No    Physically Abused: No    Sexually Abused: No    Family History  Problem Relation Age of Onset   Heart disease Father    Breast cancer Sister        53s   Prostate cancer Paternal Grandfather    Stomach cancer Cousin      Current Outpatient Medications:    albuterol (VENTOLIN HFA) 108 (90 Base) MCG/ACT inhaler, Inhale 2 puffs into the lungs every 6 (six) hours as needed for wheezing  or shortness of breath., Disp: 8 g, Rfl: 2   atorvastatin (LIPITOR) 80 MG tablet, Take 80 mg by mouth at bedtime., Disp: , Rfl:    benzonatate (TESSALON) 200 MG capsule, Take 1 capsule (200 mg total) by mouth 3 (three) times daily as needed for cough., Disp: 20 capsule, Rfl: 0   DULoxetine (CYMBALTA) 60 MG capsule, Take 60 mg by mouth at bedtime., Disp: , Rfl:    enalapril (VASOTEC) 20 MG tablet, Take 20 mg by mouth every morning., Disp: , Rfl:    fenofibrate (TRICOR) 145 MG tablet, Take 145 mg by mouth every morning., Disp: , Rfl:    fluticasone (FLONASE) 50 MCG/ACT nasal spray, Place 2 sprays into both nostrils daily as needed for allergies or rhinitis., Disp: , Rfl:    folic acid (FOLVITE) 1 MG tablet, Take 1 tablet (1 mg total) by mouth daily., Disp: 30 tablet, Rfl: 3   gabapentin (NEURONTIN) 300 MG capsule, Take 300 mg by mouth in the morning and at bedtime., Disp: , Rfl:    glipiZIDE (GLUCOTROL XL) 2.5 MG 24 hr tablet, Take 2.5 mg by mouth daily with breakfast., Disp: , Rfl:    hydrochlorothiazide (MICROZIDE) 12.5 MG capsule, Take 12.5 mg by mouth every morning., Disp: , Rfl:     Melatonin 5 MG CAPS, Take 5 mg by mouth as needed., Disp: , Rfl:    metoprolol succinate (TOPROL XL) 25 MG 24 hr tablet, Take 1 tablet (25 mg total) by mouth daily., Disp: 90 tablet, Rfl: 3   vitamin C (ASCORBIC ACID) 500 MG tablet, Take 500 mg by mouth daily., Disp: , Rfl:    aspirin 81 MG EC tablet, Take 1 tablet by mouth daily. (Patient not taking: Reported on 05/15/2022), Disp: , Rfl:    meloxicam (MOBIC) 15 MG tablet, Take 15 mg by mouth daily as needed for pain. (Patient not taking: Reported on 07/31/2022), Disp: , Rfl:    tiZANidine (ZANAFLEX) 4 MG tablet, Take 4 mg by mouth every 8 (eight) hours as needed for muscle spasms. (Patient not taking: Reported on 10/31/2022), Disp: , Rfl:    traMADol (ULTRAM) 50 MG tablet, Take 1 tablet (50 mg total) by mouth every 6 (six) hours as needed. (Patient not taking: Reported on 10/31/2022), Disp: 20 tablet, Rfl: 0 No current facility-administered medications for this visit.  Facility-Administered Medications Ordered in Other Visits:    heparin lock flush 100 unit/mL, 500 Units, Intracatheter, Once PRN, Sindy Guadeloupe, MD   pembrolizumab [facility]) 200 mg in sodium chloride 0.9 % 50 mL chemo infusion, 200 mg, Intravenous, Once, Sindy Guadeloupe, MD  Physical exam:  Vitals:   11/21/22 1300  BP: (!) 141/76  Pulse: 82  Resp: 16  Temp: 98.8 F (37.1 C)  TempSrc: Tympanic  SpO2: 97%  Weight: 119 lb 12.8 oz (54.3 kg)  Height: 5' (1.524 m)   Physical Exam Cardiovascular:     Rate and Rhythm: Normal rate and regular rhythm.     Heart sounds: Normal heart sounds.  Pulmonary:     Effort: Pulmonary effort is normal.     Breath sounds: Normal breath sounds.  Abdominal:     General: Bowel sounds are normal.     Palpations: Abdomen is soft.  Skin:    General: Skin is warm and dry.  Neurological:     Mental Status: She is alert and oriented to person, place, and time.        Latest Ref Rng & Units 11/21/2022  1:33 PM  CMP  Glucose 70 - 99  mg/dL 184   BUN 8 - 23 mg/dL 27   Creatinine 0.44 - 1.00 mg/dL 0.68   Sodium 135 - 145 mmol/L 136   Potassium 3.5 - 5.1 mmol/L 3.7   Chloride 98 - 111 mmol/L 100   CO2 22 - 32 mmol/L 26   Calcium 8.9 - 10.3 mg/dL 8.9   Total Protein 6.5 - 8.1 g/dL 7.0   Total Bilirubin 0.3 - 1.2 mg/dL 0.4   Alkaline Phos 38 - 126 U/L 53   AST 15 - 41 U/L 27   ALT 0 - 44 U/L 27       Latest Ref Rng & Units 11/21/2022    1:33 PM  CBC  WBC 4.0 - 10.5 K/uL 6.1   Hemoglobin 12.0 - 15.0 g/dL 11.8   Hematocrit 36.0 - 46.0 % 35.0   Platelets 150 - 400 K/uL 249      Assessment and plan- Patient is a 66 y.o. female with triple negative breast cancer stage IIb and cT2 N0 M0.  She is s/p neoadjuvant chemotherapy as per keynote 522 regimen.  She had right lumpectomy with sentinel lymph node biopsy which showed a pathological complete response.  She is here for on treatment assessment prior to cycle 4 of adjuvant Keytruda  Counts okay to proceed with cycle 4 of adjuvant Keytruda today and I will see her back in 3 weeks for cycle 5.  Patient has not yet been seen by radiation oncology and I am wondering the referral ASAP.  Dr. Donella Stade will be seeing her tomorrow and she will start adjuvant radiation next week.  Cough: Likely symptoms are viral.  I have sent her Tessalon Perles since Robitussin has not helped her.  I am also getting an x-ray today.  I do not think that she is having any overt pneumonitis from [facility].  Clinically patient is stable.  Lung sounds are normal and there is no overt shortness of breath or productive sputum    Visit Diagnosis 1. Malignant neoplasm of lower-inner quadrant of right breast of female, estrogen receptor negative (HCC)   2. Other cough   3. Encounter for antineoplastic immunotherapy      Dr. Randa Evens, MD, MPH [facility] at [facility] [phone] 11/21/2022 2:51 PM

## 2022-11-22 ENCOUNTER — Ambulatory Visit: Admission: RE | Admit: 2022-11-22 | Payer: Medicare HMO | Source: Ambulatory Visit

## 2022-11-22 ENCOUNTER — Other Ambulatory Visit: Payer: Self-pay

## 2022-11-22 DIAGNOSIS — Z171 Estrogen receptor negative status [ER-]: Secondary | ICD-10-CM | POA: Insufficient documentation

## 2022-11-22 DIAGNOSIS — C50312 Malignant neoplasm of lower-inner quadrant of left female breast: Secondary | ICD-10-CM | POA: Insufficient documentation

## 2022-11-23 ENCOUNTER — Other Ambulatory Visit: Payer: Self-pay

## 2022-11-23 ENCOUNTER — Other Ambulatory Visit: Payer: Self-pay | Admitting: Oncology

## 2022-11-29 DIAGNOSIS — C50312 Malignant neoplasm of lower-inner quadrant of left female breast: Secondary | ICD-10-CM | POA: Diagnosis not present

## 2022-11-30 ENCOUNTER — Ambulatory Visit
Admission: RE | Admit: 2022-11-30 | Discharge: 2022-11-30 | Disposition: A | Discharge status: Home / Self Care | Payer: Medicare HMO | Source: Ambulatory Visit | Attending: Family Medicine | Admitting: Family Medicine

## 2022-11-30 ENCOUNTER — Other Ambulatory Visit: Payer: Self-pay | Admitting: Family Medicine

## 2022-11-30 DIAGNOSIS — R059 Cough, unspecified: Secondary | ICD-10-CM

## 2022-11-30 DIAGNOSIS — J209 Acute bronchitis, unspecified: Secondary | ICD-10-CM | POA: Diagnosis not present

## 2022-11-30 DIAGNOSIS — R0602 Shortness of breath: Secondary | ICD-10-CM | POA: Diagnosis not present

## 2022-12-01 ENCOUNTER — Other Ambulatory Visit: Payer: Self-pay | Admitting: *Deleted

## 2022-12-01 DIAGNOSIS — C50311 Malignant neoplasm of lower-inner quadrant of right female breast: Secondary | ICD-10-CM

## 2022-12-03 ENCOUNTER — Emergency Department: Payer: Medicare HMO

## 2022-12-03 ENCOUNTER — Other Ambulatory Visit: Payer: Self-pay

## 2022-12-03 ENCOUNTER — Inpatient Hospital Stay
Admission: EM | Admit: 2022-12-03 | Discharge: 2022-12-05 | DRG: 202 | Disposition: A | Discharge status: Home / Self Care | Payer: Medicare HMO | Attending: Internal Medicine | Admitting: Internal Medicine

## 2022-12-03 ENCOUNTER — Encounter: Payer: Self-pay | Admitting: Emergency Medicine

## 2022-12-03 DIAGNOSIS — J40 Bronchitis, not specified as acute or chronic: Secondary | ICD-10-CM | POA: Diagnosis not present

## 2022-12-03 DIAGNOSIS — Z8042 Family history of malignant neoplasm of prostate: Secondary | ICD-10-CM | POA: Diagnosis not present

## 2022-12-03 DIAGNOSIS — F418 Other specified anxiety disorders: Secondary | ICD-10-CM | POA: Diagnosis not present

## 2022-12-03 DIAGNOSIS — Z791 Long term (current) use of non-steroidal anti-inflammatories (NSAID): Secondary | ICD-10-CM

## 2022-12-03 DIAGNOSIS — I110 Hypertensive heart disease with heart failure: Secondary | ICD-10-CM | POA: Diagnosis present

## 2022-12-03 DIAGNOSIS — Z8249 Family history of ischemic heart disease and other diseases of the circulatory system: Secondary | ICD-10-CM

## 2022-12-03 DIAGNOSIS — E785 Hyperlipidemia, unspecified: Secondary | ICD-10-CM | POA: Diagnosis present

## 2022-12-03 DIAGNOSIS — Z7984 Long term (current) use of oral hypoglycemic drugs: Secondary | ICD-10-CM | POA: Diagnosis not present

## 2022-12-03 DIAGNOSIS — J189 Pneumonia, unspecified organism: Secondary | ICD-10-CM | POA: Diagnosis not present

## 2022-12-03 DIAGNOSIS — R911 Solitary pulmonary nodule: Secondary | ICD-10-CM | POA: Diagnosis present

## 2022-12-03 DIAGNOSIS — Z1152 Encounter for screening for COVID-19: Secondary | ICD-10-CM | POA: Diagnosis not present

## 2022-12-03 DIAGNOSIS — F419 Anxiety disorder, unspecified: Secondary | ICD-10-CM | POA: Diagnosis present

## 2022-12-03 DIAGNOSIS — R0902 Hypoxemia: Secondary | ICD-10-CM

## 2022-12-03 DIAGNOSIS — F32A Depression, unspecified: Secondary | ICD-10-CM | POA: Diagnosis present

## 2022-12-03 DIAGNOSIS — Z171 Estrogen receptor negative status [ER-]: Secondary | ICD-10-CM | POA: Diagnosis not present

## 2022-12-03 DIAGNOSIS — Z853 Personal history of malignant neoplasm of breast: Secondary | ICD-10-CM

## 2022-12-03 DIAGNOSIS — J9601 Acute respiratory failure with hypoxia: Secondary | ICD-10-CM | POA: Diagnosis not present

## 2022-12-03 DIAGNOSIS — E119 Type 2 diabetes mellitus without complications: Secondary | ICD-10-CM | POA: Diagnosis present

## 2022-12-03 DIAGNOSIS — Z803 Family history of malignant neoplasm of breast: Secondary | ICD-10-CM | POA: Diagnosis not present

## 2022-12-03 DIAGNOSIS — Z9221 Personal history of antineoplastic chemotherapy: Secondary | ICD-10-CM

## 2022-12-03 DIAGNOSIS — Z79899 Other long term (current) drug therapy: Secondary | ICD-10-CM

## 2022-12-03 DIAGNOSIS — C50311 Malignant neoplasm of lower-inner quadrant of right female breast: Secondary | ICD-10-CM

## 2022-12-03 DIAGNOSIS — Z800 Family history of malignant neoplasm of digestive organs: Secondary | ICD-10-CM

## 2022-12-03 DIAGNOSIS — I5A Non-ischemic myocardial injury (non-traumatic): Secondary | ICD-10-CM

## 2022-12-03 DIAGNOSIS — R0602 Shortness of breath: Principal | ICD-10-CM

## 2022-12-03 DIAGNOSIS — J209 Acute bronchitis, unspecified: Secondary | ICD-10-CM | POA: Diagnosis present

## 2022-12-03 DIAGNOSIS — I5032 Chronic diastolic (congestive) heart failure: Secondary | ICD-10-CM

## 2022-12-03 DIAGNOSIS — Z7969 Long term (current) use of other immunomodulators and immunosuppressants: Secondary | ICD-10-CM

## 2022-12-03 DIAGNOSIS — I10 Essential (primary) hypertension: Secondary | ICD-10-CM | POA: Diagnosis not present

## 2022-12-03 DIAGNOSIS — Z7982 Long term (current) use of aspirin: Secondary | ICD-10-CM | POA: Diagnosis not present

## 2022-12-03 DIAGNOSIS — Z603 Acculturation difficulty: Secondary | ICD-10-CM | POA: Diagnosis present

## 2022-12-03 LAB — LIPID PANEL
Cholesterol: 191 mg/dL (ref 0–200)
HDL: 44 mg/dL (ref >40)
LDL Cholesterol: 103 mg/dL — ABNORMAL HIGH (ref 0–99)
Total CHOL/HDL Ratio: 4.3 RATIO
Triglycerides: 218 mg/dL — ABNORMAL HIGH (ref <150)
VLDL: 44 mg/dL — ABNORMAL HIGH (ref 0–40)

## 2022-12-03 LAB — CBC
HCT: 37.5 % (ref 36.0–46.0)
Hemoglobin: 12.5 g/dL (ref 12.0–15.0)
MCH: 32.2 pg (ref 26.0–34.0)
MCHC: 33.3 g/dL (ref 30.0–36.0)
MCV: 96.6 fL (ref 80.0–100.0)
Platelets: 243 10*3/uL (ref 150–400)
RBC: 3.88 MIL/uL (ref 3.87–5.11)
RDW: 13.3 % (ref 11.5–15.5)
WBC: 6.5 10*3/uL (ref 4.0–10.5)
nRBC: 0 % (ref 0.0–0.2)

## 2022-12-03 LAB — BASIC METABOLIC PANEL
Anion gap: 9 (ref 5–15)
BUN: 21 mg/dL (ref 8–23)
CO2: 29 mmol/L (ref 22–32)
Calcium: 9.8 mg/dL (ref 8.9–10.3)
Chloride: 99 mmol/L (ref 98–111)
Creatinine, Ser: 0.69 mg/dL (ref 0.44–1.00)
GFR, Estimated: >60 mL/min (ref >60)
Glucose, Bld: 198 mg/dL — ABNORMAL HIGH (ref 70–99)
Potassium: 3.8 mmol/L (ref 3.5–5.1)
Sodium: 137 mmol/L (ref 135–145)

## 2022-12-03 LAB — RESP PANEL BY RT-PCR (RSV, FLU A&B, COVID)  RVPGX2
Influenza A by PCR: NEGATIVE
Influenza B by PCR: NEGATIVE
Resp Syncytial Virus by PCR: NEGATIVE
SARS Coronavirus 2 by RT PCR: NEGATIVE

## 2022-12-03 LAB — TROPONIN I (HIGH SENSITIVITY)
Troponin I (High Sensitivity): 23 ng/L — ABNORMAL HIGH (ref <18)
Troponin I (High Sensitivity): 25 ng/L — ABNORMAL HIGH (ref <18)
Troponin I (High Sensitivity): 15 ng/L (ref <18)

## 2022-12-03 LAB — CBG MONITORING, ED
Glucose-Capillary: 341 mg/dL — ABNORMAL HIGH (ref 70–99)
Glucose-Capillary: 263 mg/dL — ABNORMAL HIGH (ref 70–99)
Glucose-Capillary: 176 mg/dL — ABNORMAL HIGH (ref 70–99)
Glucose-Capillary: 156 mg/dL — ABNORMAL HIGH (ref 70–99)

## 2022-12-03 LAB — STREP PNEUMONIAE URINARY ANTIGEN: Strep Pneumo Urinary Antigen: NEGATIVE

## 2022-12-03 LAB — PROCALCITONIN
Procalcitonin: <0.1 ng/mL
Procalcitonin: <0.1 ng/mL

## 2022-12-03 LAB — LACTIC ACID, PLASMA: Lactic Acid, Venous: 1.5 mmol/L (ref 0.5–1.9)

## 2022-12-03 LAB — HIV ANTIBODY (ROUTINE TESTING W REFLEX): HIV Screen 4th Generation wRfx: NONREACTIVE

## 2022-12-03 LAB — BRAIN NATRIURETIC PEPTIDE: B Natriuretic Peptide: 14.7 pg/mL (ref 0.0–100.0)

## 2022-12-03 MED ORDER — IPRATROPIUM-ALBUTEROL 0.5-2.5 (3) MG/3ML IN SOLN
3.0000 mL | RESPIRATORY_TRACT | Status: DC | PRN
  Administered 2022-12-03: 3 mL via RESPIRATORY_TRACT
  Filled 2022-12-03: qty 3

## 2022-12-03 MED ORDER — ASPIRIN 81 MG PO TBEC
81.0000 mg | DELAYED_RELEASE_TABLET | Freq: Every day | ORAL | Status: DC
  Administered 2022-12-03 – 2022-12-05 (×3): 81 mg via ORAL
  Filled 2022-12-03 (×3): qty 1

## 2022-12-03 MED ORDER — ALBUTEROL SULFATE (2.5 MG/3ML) 0.083% IN NEBU
3.0000 mL | INHALATION_SOLUTION | RESPIRATORY_TRACT | Status: DC | PRN
  Administered 2022-12-04 – 2022-12-05 (×2): 3 mL via RESPIRATORY_TRACT
  Filled 2022-12-03 (×2): qty 3

## 2022-12-03 MED ORDER — INSULIN ASPART 100 UNIT/ML IJ SOLN
0.0000 [IU] | Freq: Three times a day (TID) | INTRAMUSCULAR | Status: DC
  Administered 2022-12-03: 2 [IU] via SUBCUTANEOUS
  Administered 2022-12-03: 5 [IU] via SUBCUTANEOUS
  Administered 2022-12-03: 7 [IU] via SUBCUTANEOUS
  Administered 2022-12-04: 5 [IU] via SUBCUTANEOUS
  Administered 2022-12-04 – 2022-12-05 (×2): 2 [IU] via SUBCUTANEOUS
  Filled 2022-12-03 (×6): qty 1

## 2022-12-03 MED ORDER — DULOXETINE HCL 30 MG PO CPEP
60.0000 mg | ORAL_CAPSULE | Freq: Every day | ORAL | Status: DC
  Administered 2022-12-03 – 2022-12-05 (×3): 60 mg via ORAL
  Filled 2022-12-03: qty 2
  Filled 2022-12-03: qty 1
  Filled 2022-12-03: qty 2

## 2022-12-03 MED ORDER — FLUTICASONE PROPIONATE 50 MCG/ACT NA SUSP: 2.0000 | Freq: Every day | NASAL | Status: DC | PRN

## 2022-12-03 MED ORDER — FENOFIBRATE 160 MG PO TABS
160.0000 mg | ORAL_TABLET | Freq: Every day | ORAL | Status: DC
  Administered 2022-12-03 – 2022-12-05 (×3): 160 mg via ORAL
  Filled 2022-12-03 (×3): qty 1

## 2022-12-03 MED ORDER — SODIUM CHLORIDE 0.9 % IV SOLN: 2.0000 g | INTRAVENOUS | Status: DC

## 2022-12-03 MED ORDER — DM-GUAIFENESIN ER 30-600 MG PO TB12: 1.0000 | ORAL_TABLET | Freq: Two times a day (BID) | ORAL | Status: DC | PRN

## 2022-12-03 MED ORDER — GABAPENTIN 300 MG PO CAPS
300.0000 mg | ORAL_CAPSULE | Freq: Two times a day (BID) | ORAL | Status: DC
  Administered 2022-12-03 – 2022-12-05 (×5): 300 mg via ORAL
  Filled 2022-12-03 (×5): qty 1

## 2022-12-03 MED ORDER — VITAMIN C 500 MG PO TABS
500.0000 mg | ORAL_TABLET | Freq: Every day | ORAL | Status: DC
  Administered 2022-12-03 – 2022-12-05 (×3): 500 mg via ORAL
  Filled 2022-12-03 (×3): qty 1

## 2022-12-03 MED ORDER — SODIUM CHLORIDE 0.9 % IV SOLN: 1.0000 g | Freq: Once | INTRAVENOUS | Status: DC

## 2022-12-03 MED ORDER — ACETYLCYSTEINE 20 % IN SOLN
4.0000 mL | Freq: Two times a day (BID) | RESPIRATORY_TRACT | Status: DC
  Administered 2022-12-03 – 2022-12-05 (×4): 4 mL via RESPIRATORY_TRACT
  Filled 2022-12-03 (×5): qty 4

## 2022-12-03 MED ORDER — ENALAPRIL MALEATE 10 MG PO TABS
20.0000 mg | ORAL_TABLET | Freq: Every day | ORAL | Status: DC
  Administered 2022-12-03 – 2022-12-05 (×3): 20 mg via ORAL
  Filled 2022-12-03 (×2): qty 1
  Filled 2022-12-03 (×3): qty 2

## 2022-12-03 MED ORDER — ONDANSETRON HCL 4 MG/2ML IJ SOLN: 4.0000 mg | Freq: Four times a day (QID) | INTRAMUSCULAR | Status: DC | PRN

## 2022-12-03 MED ORDER — ATORVASTATIN CALCIUM 20 MG PO TABS
80.0000 mg | ORAL_TABLET | Freq: Every day | ORAL | Status: DC
  Administered 2022-12-03 – 2022-12-05 (×3): 80 mg via ORAL
  Filled 2022-12-03 (×3): qty 4

## 2022-12-03 MED ORDER — INSULIN ASPART 100 UNIT/ML IJ SOLN: 0.0000 [IU] | Freq: Every day | INTRAMUSCULAR | Status: DC

## 2022-12-03 MED ORDER — MELATONIN 5 MG PO TABS
5.0000 mg | ORAL_TABLET | ORAL | Status: DC | PRN
  Administered 2022-12-04: 5 mg via ORAL
  Filled 2022-12-03: qty 1

## 2022-12-03 MED ORDER — METOPROLOL SUCCINATE ER 25 MG PO TB24
25.0000 mg | ORAL_TABLET | Freq: Every day | ORAL | Status: DC
  Administered 2022-12-03 – 2022-12-05 (×3): 25 mg via ORAL
  Filled 2022-12-03 (×3): qty 1

## 2022-12-03 MED ORDER — ACETAMINOPHEN 650 MG RE SUPP: 650.0000 mg | Freq: Four times a day (QID) | RECTAL | Status: DC | PRN

## 2022-12-03 MED ORDER — GLIPIZIDE ER 2.5 MG PO TB24: 2.5000 mg | ORAL_TABLET | Freq: Every day | ORAL | Status: DC

## 2022-12-03 MED ORDER — HYDRALAZINE HCL 20 MG/ML IJ SOLN: 5.0000 mg | INTRAMUSCULAR | Status: DC | PRN

## 2022-12-03 MED ORDER — FOLIC ACID 1 MG PO TABS
1.0000 mg | ORAL_TABLET | Freq: Every day | ORAL | Status: DC
  Administered 2022-12-03 – 2022-12-05 (×3): 1 mg via ORAL
  Filled 2022-12-03 (×3): qty 1

## 2022-12-03 MED ORDER — ENOXAPARIN SODIUM 40 MG/0.4ML IJ SOSY
40.0000 mg | PREFILLED_SYRINGE | INTRAMUSCULAR | Status: DC
  Administered 2022-12-03 – 2022-12-04 (×2): 40 mg via SUBCUTANEOUS
  Filled 2022-12-03 (×2): qty 0.4

## 2022-12-03 MED ORDER — DIPHENHYDRAMINE HCL 50 MG/ML IJ SOLN
25.0000 mg | Freq: Once | INTRAMUSCULAR | Status: AC
  Administered 2022-12-03: 25 mg via INTRAVENOUS
  Filled 2022-12-03: qty 1

## 2022-12-03 MED ORDER — SODIUM CHLORIDE 0.9 % IV SOLN
1.0000 g | Freq: Once | INTRAVENOUS | Status: AC
  Administered 2022-12-03: 1 g via INTRAVENOUS
  Filled 2022-12-03: qty 10

## 2022-12-03 MED ORDER — HYDROCHLOROTHIAZIDE 12.5 MG PO TABS: 12.5000 mg | ORAL_TABLET | Freq: Every day | ORAL | Status: DC

## 2022-12-03 MED ORDER — SODIUM CHLORIDE 0.9 % IV SOLN
500.0000 mg | Freq: Once | INTRAVENOUS | Status: AC
  Administered 2022-12-03: 500 mg via INTRAVENOUS
  Filled 2022-12-03: qty 5

## 2022-12-03 MED ORDER — BENZONATATE 100 MG PO CAPS
200.0000 mg | ORAL_CAPSULE | Freq: Three times a day (TID) | ORAL | Status: DC | PRN
  Filled 2022-12-03: qty 2

## 2022-12-03 MED ORDER — ONDANSETRON HCL 4 MG PO TABS: 4.0000 mg | ORAL_TABLET | Freq: Four times a day (QID) | ORAL | Status: DC | PRN

## 2022-12-03 MED ORDER — SODIUM CHLORIDE 0.9 % IV SOLN
500.0000 mg | INTRAVENOUS | Status: DC
  Administered 2022-12-04: 500 mg via INTRAVENOUS
  Filled 2022-12-03: qty 5

## 2022-12-03 MED ORDER — SODIUM CHLORIDE 0.9 % IV SOLN
2.0000 g | INTRAVENOUS | Status: DC
  Administered 2022-12-04: 2 g via INTRAVENOUS
  Filled 2022-12-03: qty 2

## 2022-12-03 MED ORDER — SODIUM CHLORIDE 0.9 % IV SOLN: INTRAVENOUS | Status: DC

## 2022-12-03 MED ORDER — ENALAPRIL MALEATE 10 MG PO TABS: 20.0000 mg | ORAL_TABLET | Freq: Every day | ORAL | Status: DC

## 2022-12-03 MED ORDER — TIZANIDINE HCL 2 MG PO TABS: 4.0000 mg | ORAL_TABLET | Freq: Three times a day (TID) | ORAL | Status: DC | PRN

## 2022-12-03 MED ORDER — ACETAMINOPHEN 325 MG PO TABS
650.0000 mg | ORAL_TABLET | Freq: Four times a day (QID) | ORAL | Status: DC | PRN
  Administered 2022-12-04: 650 mg via ORAL
  Filled 2022-12-03: qty 2

## 2022-12-03 MED ORDER — MAGNESIUM HYDROXIDE 400 MG/5ML PO SUSP: 30.0000 mL | Freq: Every day | ORAL | Status: DC | PRN

## 2022-12-03 MED ORDER — METHYLPREDNISOLONE SODIUM SUCC 125 MG IJ SOLR
125.0000 mg | Freq: Once | INTRAMUSCULAR | Status: AC
  Administered 2022-12-03: 125 mg via INTRAVENOUS
  Filled 2022-12-03: qty 2

## 2022-12-03 MED ORDER — IOHEXOL 350 MG/ML SOLN
75.0000 mL | Freq: Once | INTRAVENOUS | Status: AC | PRN
  Administered 2022-12-03: 60 mL via INTRAVENOUS

## 2022-12-03 MED ORDER — IPRATROPIUM-ALBUTEROL 0.5-2.5 (3) MG/3ML IN SOLN
3.0000 mL | Freq: Once | RESPIRATORY_TRACT | Status: AC
  Administered 2022-12-03: 3 mL via RESPIRATORY_TRACT
  Filled 2022-12-03: qty 3

## 2022-12-03 MED ORDER — TRAZODONE HCL 50 MG PO TABS
25.0000 mg | ORAL_TABLET | Freq: Every evening | ORAL | Status: DC | PRN
  Filled 2022-12-03: qty 1

## 2022-12-03 NOTE — H&P (Signed)
History and Physical    Susan [name] YYT:[id number] DOB: [birth year] DOA: 12/03/2022  Referring MD/NP/PA:   PCP: Frazier Richards, MD   Patient coming from:  The patient is coming from home.   Chief Complaint: SOB  HPI: Susan [name] is a 66 y.o. female with medical history significant of hypertension, hyperlipidemia, diabetes mellitus, diastolic CHF, depression with anxiety, pancreatitis, gastritis, vitamin B12 deficiency, right breast cancer (s/p of her mastectomy, on Keytruda), who presents with shortness breath.  Per patient and her grandson at the bedside, she has shortness of breath for several months, which has been progressively worsening in the past 2 weeks.  She has cough with yellow-colored sputum production.  She has chest pressure feeling sometimes, denies active chest pain to me.  No fever or chills.  She is not using oxygen normally, but was found to have oxygen desaturation to 88% on room air, which improved to 95% on 4 L oxygen.  Patient denies nausea, vomiting, diarrhea or abdominal pain.  No symptoms of UTI. She had an outpatient CXR that was done few days ago and was restarted on doxycycline antibiotics.  Reports taking 2 days of this doxycycline, without improvement.  Data reviewed independently and ED Course: pt was found to have WBC 6.5, lactic acid 1.5, procalcitonin<0.10, negative PCR for COVID, flu and RSV.  GFR> 60.  Temperature normal, blood pressure 122/69, heart rate 109, 98, RR 24, 17.  Chest x-ray negative.  CTA negative for PE, but showed possible left lower lobe infiltration and mucoid plug.  Patient is admitted to telemetry bed as inpatient.   CTA 1. No evidence of arterial dilatation or embolus. 2. Increased bronchial thickening with scattered segmental and subsegmental bronchial impactions, likely mucoid impactions. 3. Asymmetric increased haziness in the posterior left lower lobe which could be early bronchopneumonia or related to air trapping. 4.  Geographic areas of mosaic attenuation in the upper and right lower lobes most consistent with small airways disease with air trapping. 5. New 6 mm subsolid ground-glass nodule in the right upper lobe apex. Initial follow-up with CT at 6 months is recommended to confirm persistence. If persistent, repeat CT is recommended every 2 years until 5 years of stability has been established. This recommendation follows the consensus statement: Guidelines for Management of Incidental Pulmonary Nodules Detected on CT Images: From the Fleischner Society 2017; Radiology 2017; 284:228-243. 6. Increased skin thickening over the right breast. Differential diagnosis includes XRT, mastitis, and tumor invasion of dermal lymphatics. 7. Aortic atherosclerosis. 8. Small hiatal hernia with increased thickening of the distal thoracic esophagus consistent with esophagitis. 9. Hepatic steatosis.   Aortic Atherosclerosis (ICD10-I70.0).   EKG: I have personally reviewed.  Sinus rhythm, QTc 467, LAE, no ischemic change   Review of Systems:   General: no fevers, chills, no body weight gain,  has fatigue HEENT: no blurry vision, hearing changes or sore throat Respiratory: has dyspnea, coughing, no wheezing CV: no chest pain, no palpitations GI: no nausea, vomiting, abdominal pain, diarrhea, constipation GU: no dysuria, burning on urination, increased urinary frequency, hematuria  Ext: no leg edema Neuro: no unilateral weakness, numbness, or tingling, no vision change or hearing loss Skin: no rash, no skin tear. MSK: No muscle spasm, no deformity, no limitation of range of movement in spin Heme: No easy bruising.  Travel history: No recent long distant travel.   Allergy: No Active Allergies  Past Medical History:  Diagnosis Date   Breast cancer, right breast ([place])  Diabetes mellitus without complication (HCC)    Family history of prostate cancer    Gastritis    Heart murmur    History of  chemotherapy    History of pancreatitis    Hyperlipidemia    Hypertension    Palpitations    Port-A-Cath in place    Vitamin B12 deficiency     Past Surgical History:  Procedure Laterality Date   BREAST BIOPSY Right 11/2015   benign   BREAST BIOPSY Right 01/24/2022   INVASIVE MAMMARY CARCINOMA, NO SPECIAL TYPE   BREAST CYST EXCISION Right    CESAREAN SECTION     2x   CHOLECYSTECTOMY     IR CV LINE INJECTION  03/28/2022   PARTIAL MASTECTOMY WITH AXILLARY SENTINEL LYMPH NODE BIOPSY Right 08/16/2022   Procedure: PARTIAL MASTECTOMY WITH AXILLARY SENTINEL LYMPH NODE BIOPSY -- RF guided;  Surgeon: Herbert Pun, MD;  Location: [facility] ORS;  Service: General;  Laterality: Right;   PORTACATH PLACEMENT N/A 03/01/2022   Procedure: INSERTION PORT-A-CATH;  Surgeon: Herbert Pun, MD;  Location: [facility] ORS;  Service: General;  Laterality: N/A;    Social History:  reports that she has never smoked. She has never used smokeless tobacco. She reports that she does not drink alcohol and does not use drugs.  Family History:  Family History  Problem Relation Age of Onset   Heart disease Father    Breast cancer Sister        71s   Prostate cancer Paternal Grandfather    Stomach cancer Cousin      Prior to Admission medications   Medication Sig Start Date End Date Taking? Authorizing Provider  aspirin 81 MG EC tablet Take 1 tablet by mouth daily. Patient not taking: Reported on 05/15/2022 11/02/16 07/12/30  [provider]  atorvastatin (LIPITOR) 80 MG tablet Take 80 mg by mouth at bedtime. 03/25/20   [provider]  benzonatate (TESSALON) 200 MG capsule Take 1 capsule (200 mg total) by mouth 3 (three) times daily as needed for cough. 11/21/22   Sindy Guadeloupe, MD  DULoxetine (CYMBALTA) 60 MG capsule Take 60 mg by mouth at bedtime. 03/09/20   [provider]  enalapril (VASOTEC) 20 MG tablet Take 20 mg by mouth every morning. 02/23/20   [provider]   fenofibrate (TRICOR) 145 MG tablet Take 145 mg by mouth every morning.    [provider]  fluticasone (FLONASE) 50 MCG/ACT nasal spray Place 2 sprays into both nostrils daily as needed for allergies or rhinitis. 04/05/20   [provider]  folic acid (FOLVITE) 1 MG tablet Take 1 tablet (1 mg total) by mouth daily. 04/17/22   Sindy Guadeloupe, MD  gabapentin (NEURONTIN) 300 MG capsule Take 300 mg by mouth in the morning and at bedtime. 03/01/20   [provider]  glipiZIDE (GLUCOTROL XL) 2.5 MG 24 hr tablet Take 2.5 mg by mouth daily with breakfast. 03/09/20   [provider]  hydrochlorothiazide (MICROZIDE) 12.5 MG capsule Take 12.5 mg by mouth every morning.    [provider]  Melatonin 5 MG CAPS Take 5 mg by mouth as needed.    [provider]  meloxicam (MOBIC) 15 MG tablet Take 15 mg by mouth daily as needed for pain. Patient not taking: Reported on 07/31/2022    [provider]  metoprolol succinate (TOPROL XL) 25 MG 24 hr tablet Take 1 tablet (25 mg total) by mouth daily. 04/30/20   Kate Sable, MD  tiZANidine (  ZANAFLEX) 4 MG tablet Take 4 mg by mouth every 8 (eight) hours as needed for muscle spasms. Patient not taking: Reported on 10/31/2022 04/21/20   [provider]  traMADol (ULTRAM) 50 MG tablet Take 1 tablet (50 mg total) by mouth every 6 (six) hours as needed. Patient not taking: Reported on 10/31/2022 08/16/22 08/16/23  Herbert Pun, MD  VENTOLIN HFA [phone] Base) MCG/ACT inhaler Inhale 2 puffs by mouth into the lungs every 6 hours as needed for difficulty breathing. 11/24/22   Sindy Guadeloupe, MD  vitamin C (ASCORBIC ACID) 500 MG tablet Take 500 mg by mouth daily.    [provider]  prochlorperazine (COMPAZINE) 10 MG tablet Take 1 tablet (10 mg total) by mouth every 6 (six) hours as needed (Nausea or vomiting). Patient not taking: Reported on 08/29/2022 02/21/22 08/29/22  Sindy Guadeloupe, MD     Physical Exam: Vitals:   12/03/22 0900 12/03/22 1200 12/03/22 1202 12/03/22 1212  BP: 136/65 (!) 141/69  (!) 141/69  Pulse: 93 91    Resp: 19 (!) 21    Temp:   98.4 F (36.9 C)   TempSrc:   Oral   SpO2: 97% 97%    Weight:      Height:       General: Not in acute distress HEENT:       Eyes: PERRL, EOMI, no scleral icterus.       ENT: No discharge from the ears and nose, no pharynx injection, no tonsillar enlargement.        Neck: No JVD, no bruit, no mass felt. Heme: No neck lymph node enlargement. Cardiac: S1/S2, RRR, No murmurs, No gallops or rubs. Respiratory: No rales, wheezing, rhonchi or rubs. GI: Soft, nondistended, nontender, no rebound pain, no organomegaly, BS present. GU: No hematuria Ext: No pitting leg edema bilaterally. 1+DP/PT pulse bilaterally. Musculoskeletal: No joint deformities, No joint redness or warmth, no limitation of ROM in spin. Skin: No rashes.  Neuro: Alert, oriented X3, cranial nerves II-XII grossly intact, moves all extremities normally.  Psych: Patient is not psychotic, no suicidal or hemocidal ideation.  Labs on Admission: I have personally reviewed following labs and imaging studies  CBC: Recent Labs  Lab 12/03/22 0102  WBC 6.5  HGB 12.5  HCT 37.5  MCV 96.6  PLT 585   Basic Metabolic Panel: Recent Labs  Lab 12/03/22 0102  NA 137  K 3.8  CL 99  CO2 29  GLUCOSE 198*  BUN 21  CREATININE 0.69  CALCIUM 9.8   GFR: Estimated Creatinine Clearance: 48.3 mL/min (by C-G formula based on SCr of 0.69 mg/dL). Liver Function Tests: No results for input(s): "AST", "ALT", "ALKPHOS", "BILITOT", "PROT", "ALBUMIN" in the last 168 hours. No results for input(s): "LIPASE", "AMYLASE" in the last 168 hours. No results for input(s): "AMMONIA" in the last 168 hours. Coagulation Profile: No results for input(s): "INR", "PROTIME" in the last 168 hours. Cardiac Enzymes: No results for input(s): "CKTOTAL", "CKMB", "CKMBINDEX", "TROPONINI" in the  last 168 hours. BNP (last 3 results) No results for input(s): "PROBNP" in the last 8760 hours. HbA1C: No results for input(s): "HGBA1C" in the last 72 hours. CBG: Recent Labs  Lab 12/03/22 0851 12/03/22 1205  GLUCAP 341* 263*   Lipid Profile: Recent Labs    12/03/22 0102  CHOL 191  HDL 44  LDLCALC 103*  TRIG 218*  CHOLHDL 4.3   Thyroid Function Tests: No results for input(s): "TSH", "T4TOTAL", "FREET4", "T3FREE", "THYROIDAB" in the last  72 hours. Anemia Panel: No results for input(s): "VITAMINB12", "FOLATE", "FERRITIN", "TIBC", "IRON", "RETICCTPCT" in the last 72 hours. Urine analysis: No results found for: "COLORURINE", "APPEARANCEUR", "LABSPEC", "PHURINE", "GLUCOSEU", "HGBUR", "BILIRUBINUR", "KETONESUR", "PROTEINUR", "UROBILINOGEN", "NITRITE", "LEUKOCYTESUR" Sepsis Labs: '@LABRCNTIP'$ (procalcitonin:4,lacticidven:4) ) Recent Results (from the past 240 hour(s))  Resp panel by RT-PCR (RSV, Flu A&B, Covid) Anterior Nasal Swab     Status: None   Collection Time: 12/03/22  1:02 AM   Specimen: Anterior Nasal Swab  Result Value Ref Range Status   SARS Coronavirus 2 by RT PCR NEGATIVE NEGATIVE Final    Comment: (NOTE) SARS-CoV-2 target nucleic acids are NOT DETECTED.  The SARS-CoV-2 RNA is generally detectable in upper respiratory specimens during the acute phase of infection. The lowest concentration of SARS-CoV-2 viral copies this assay can detect is 138 copies/mL. A negative result does not preclude SARS-Cov-2 infection and should not be used as the sole basis for treatment or other patient management decisions. A negative result may occur with  improper specimen collection/handling, submission of specimen other than nasopharyngeal swab, presence of viral mutation(s) within the areas targeted by this assay, and inadequate number of viral copies(<138 copies/mL). A negative result must be combined with clinical observations, patient history, and epidemiological information.  The expected result is Negative.  Fact Sheet for Patients:  EntrepreneurPulse.com.au  Fact Sheet for Healthcare Providers:  IncredibleEmployment.be  This test is no t yet approved or cleared by the [place] FDA and  has been authorized for detection and/or diagnosis of SARS-CoV-2 by FDA under an Emergency Use Authorization (EUA). This EUA will remain  in effect (meaning this test can be used) for the duration of the COVID-19 declaration under Section 564(b)(1) of the Act, 21 U.S.C.section 360bbb-3(b)(1), unless the authorization is terminated  or revoked sooner.       Influenza A by PCR NEGATIVE NEGATIVE Final   Influenza B by PCR NEGATIVE NEGATIVE Final    Comment: (NOTE) The Xpert Xpress SARS-CoV-2/FLU/RSV plus assay is intended as an aid in the diagnosis of influenza from Nasopharyngeal swab specimens and should not be used as a sole basis for treatment. Nasal washings and aspirates are unacceptable for Xpert Xpress SARS-CoV-2/FLU/RSV testing.  Fact Sheet for Patients: EntrepreneurPulse.com.au  Fact Sheet for Healthcare Providers: IncredibleEmployment.be  This test is not yet approved or cleared by the [place] FDA and has been authorized for detection and/or diagnosis of SARS-CoV-2 by FDA under an Emergency Use Authorization (EUA). This EUA will remain in effect (meaning this test can be used) for the duration of the COVID-19 declaration under Section 564(b)(1) of the Act, 21 U.S.C. section 360bbb-3(b)(1), unless the authorization is terminated or revoked.     Resp Syncytial Virus by PCR NEGATIVE NEGATIVE Final    Comment: (NOTE) Fact Sheet for Patients: EntrepreneurPulse.com.au  Fact Sheet for Healthcare Providers: IncredibleEmployment.be  This test is not yet approved or cleared by the [place] FDA and has been authorized for detection and/or  diagnosis of SARS-CoV-2 by FDA under an Emergency Use Authorization (EUA). This EUA will remain in effect (meaning this test can be used) for the duration of the COVID-19 declaration under Section 564(b)(1) of the Act, 21 U.S.C. section 360bbb-3(b)(1), unless the authorization is terminated or revoked.  Performed at [facility], [address] [place], [place] [address]   Blood culture (routine x 2)     Status: None (Preliminary result)   Collection Time: 12/03/22  1:02 AM   Specimen: BLOOD  Result Value Ref Range Status  Specimen Description BLOOD RIGHT ANTECUBITAL  Final   Special Requests   Final    BOTTLES DRAWN AEROBIC AND ANAEROBIC Blood Culture adequate volume   Culture   Final    NO GROWTH < 12 HOURS Performed at [facility], [address] [place], [place] [address]    Report Status PENDING  Incomplete  Blood culture (routine x 2)     Status: None (Preliminary result)   Collection Time: 12/03/22  1:02 AM   Specimen: BLOOD  Result Value Ref Range Status   Specimen Description BLOOD BLOOD LEFT ARM  Final   Special Requests   Final    BOTTLES DRAWN AEROBIC AND ANAEROBIC Blood Culture adequate volume   Culture   Final    NO GROWTH < 12 HOURS Performed at [facility], [address] [place], [place] [address]    Report Status PENDING  Incomplete     Radiological Exams on Admission: CT Angio Chest PE W and/or Wo Contrast  Result Date: 12/03/2022 CLINICAL DATA:  Right-sided breast cancer with partial right mastectomy 08/16/2022, presents with tachycardia, hypoxia and tachypnea. EXAM: CT ANGIOGRAPHY CHEST WITH CONTRAST TECHNIQUE: Multidetector CT imaging of the chest was performed using the standard protocol during bolus administration of intravenous contrast. Multiplanar CT image reconstructions and MIPs were obtained to evaluate the vascular anatomy. RADIATION DOSE REDUCTION: This exam was performed according to the departmental  dose-optimization program which includes automated exposure control, adjustment of the mA and/or kV according to patient size and/or use of iterative reconstruction technique. CONTRAST:  48m OMNIPAQUE IOHEXOL 350 MG/ML SOLN COMPARISON:  Portable chest today, portable chest 11/30/2022 and 09/23/2022, and PET-CT 02/17/2022. FINDINGS: Cardiovascular: Left chest MediPort with IJ approach catheter terminating in the distal SVC. The cardiac size is normal. No coronary calcification or pericardial effusion is seen. There is mild aortic atherosclerosis. There is no aortic dissection, aneurysm or stenosis. The great vessels are clear. Pulmonary arteries are normal caliber without evidence of thromboemboli. Pulmonary veins are decompressed. Mediastinum/Nodes: Mild elevation right hemidiaphragm, unchanged. Small hiatal hernia. There is no intrathoracic or axillary adenopathy. There is increased thickening of the distal thoracic esophagus consistent with esophagitis. Thyroid gland and thoracic trachea are unremarkable. Lungs/Pleura: No pleural effusion, thickening or pneumothorax. There is diffuse increased bronchial thickening. There are scattered segmental and subsegmental bronchial impactions in the left upper and both lower lobes, likely mucoid impactions. There is asymmetric increased haziness in the posterior left lower lobe which could be early bronchopneumonia or related to air trapping. Additional geographic areas of mosaic attenuation in the upper and right lower lobes are most consistent with small airways disease with air trapping. No confluent airspace consolidation is seen. There is mild posterior atelectasis asymmetrically left lower lobe. There is new demonstration of a 6 mm subsolid ground-glass nodule in the right upper lobe apex on 5:33. No other nodules are seen. Upper Abdomen: No acute abnormality. No adrenal mass. Mild hepatic steatosis. Musculoskeletal: There is increased skin thickening over the right  breast. Differential diagnosis includes XRT, mastitis, and tumor invasion of dermal lymphatics. There are coarse interstitial changes in the inferior right breast where the hypermetabolic mass was previously noted, most likely due to postsurgical change. Follow-up as indicated. There is mild thoracic kyphosis and mild anterior wedging of multiple mid to lower thoracic spine vertebral bodies, with spondylosis and bridging enthesopathy. No destructive bone lesion is seen. Review of the MIP images confirms the above findings. IMPRESSION: 1. No evidence of arterial dilatation or embolus.  2. Increased bronchial thickening with scattered segmental and subsegmental bronchial impactions, likely mucoid impactions. 3. Asymmetric increased haziness in the posterior left lower lobe which could be early bronchopneumonia or related to air trapping. 4. Geographic areas of mosaic attenuation in the upper and right lower lobes most consistent with small airways disease with air trapping. 5. New 6 mm subsolid ground-glass nodule in the right upper lobe apex. Initial follow-up with CT at 6 months is recommended to confirm persistence. If persistent, repeat CT is recommended every 2 years until 5 years of stability has been established. This recommendation follows the consensus statement: Guidelines for Management of Incidental Pulmonary Nodules Detected on CT Images: From the Fleischner Society 2017; Radiology 2017; 284:228-243. 6. Increased skin thickening over the right breast. Differential diagnosis includes XRT, mastitis, and tumor invasion of dermal lymphatics. 7. Aortic atherosclerosis. 8. Small hiatal hernia with increased thickening of the distal thoracic esophagus consistent with esophagitis. 9. Hepatic steatosis. Aortic Atherosclerosis (ICD10-I70.0). Electronically Signed   By: Telford Nab M.D.   On: 12/03/2022 03:03   DG Chest Portable 1 View  Result Date: 12/03/2022 CLINICAL DATA:  Shortness of breath EXAM:  PORTABLE CHEST 1 VIEW COMPARISON:  11/30/2022 FINDINGS: Cardiac shadow is stable. Left chest wall port is again seen and stable. Lungs are well aerated bilaterally. No focal infiltrate or effusion is seen. No bony abnormality is noted. IMPRESSION: No acute abnormality noted. Electronically Signed   By: Inez Catalina M.D.   On: 12/03/2022 01:03      Assessment/Plan Principal Problem:   CAP (community acquired pneumonia) Active Problems:   Chronic diastolic CHF (congestive heart failure) (HCC)   Essential hypertension   Diabetes mellitus without complication (HCC)   HLD (hyperlipidemia)   Depression with anxiety   Myocardial injury   Malignant neoplasm of lower-inner quadrant of right breast of female, estrogen receptor negative ([place])   Lung nodule   Assessment and Plan:   Possible CAP (community acquired pneumonia): Patient has 4L of new oxygen requirement, but no acute respiratory distress during the interview.  CTA negative for PE, does showed possible left lower lobe infiltration and mucoid plug.  Patient had transient tachypnea with RR 24 --> 17, heart rate 109 --> 90, no fever or leukocytosis.  Lactic acid normal 1.5.  Clinically does not seem to have sepsis.  - Admit to tele bed as inpt - IV Rocephin and azithromycin - Mucomyst nebulizer - Mucinex for cough  - Bronchodilators - Urine legionella and S. pneumococcal antigen - Follow up blood culture x2, sputum culture  Chronic diastolic CHF (congestive heart failure) ([place]): 2D echo on 05/22/2021 showed EF of 60 to 65% with grade 1 diastolic dysfunction.  Patient does not have leg edema.  Does not seem to have CHF exacerbation. -check BNP  Essential hypertension: -Enalapril, metoprolol -Hold HCTZ -IV hydralazine as needed  Diabetes mellitus without complication [facility]): Recent A1c 6.6, well-controlled.  Patient is taking glipizide. -Sliding scale insulin  HLD (hyperlipidemia) -Fenofibrate, Lipitor  Depression with  anxiety: -Continue home medications  Myocardial injury: Troponin level 23, 25, 15 -Check A1c, FLP -Aspirin, Lipitor, fenofibrate  Malignant neoplasm of lower-inner quadrant of right breast of female, estrogen receptor negative ([place]): S/p of mastectomy currently on Keytruda -Follow-up with Dr. Janese Banks of oncology  Lung nodule: CTA showed 6 mm of nodule on right upper lobe -This needs to be followed up with PCP and oncology to make sure it is not metastasized disease   DVT ppx:  SQ Lovenox  Code  Status: Full code  Family Communication:   Yes, patient's grandson   at bed side.  Disposition Plan:  Anticipate discharge back to previous environment  Consults called:  none  Admission status and Level of care: Telemetry Medical:     as inpt      Dispo: The patient is from: Home              Anticipated d/c is to: Home              Anticipated d/c date is: 2 days              Patient currently is not medically stable to d/c.    Severity of Illness:  The appropriate patient status for this patient is INPATIENT. Inpatient status is judged to be reasonable and necessary in order to provide the required intensity of service to ensure the patient's safety. The patient's presenting symptoms, physical exam findings, and initial radiographic and laboratory data in the context of their chronic comorbidities is felt to place them at high risk for further clinical deterioration. Furthermore, it is not anticipated that the patient will be medically stable for discharge from the hospital within 2 midnights of admission.   * I certify that at the point of admission it is my clinical judgment that the patient will require inpatient hospital care spanning beyond 2 midnights from the point of admission due to high intensity of service, high risk for further deterioration and high frequency of surveillance required.*       Date of Service 12/03/2022    Ivor Costa [facility]   If 7PM-7AM,  please contact night-coverage [email or web address] 12/03/2022, 4:03 PM

## 2022-12-03 NOTE — ED Notes (Signed)
Pt room air saturation 86% placed back on 3l [place]

## 2022-12-03 NOTE — ED Triage Notes (Addendum)
Pt arrived via POV with family reports shortness of breath for several months but worse over the past week, pt has cough present.  Pt's cough is productive with phlegm.  Pt has been able to eat and drink.   Pt has had some pressure and tightness in chest.   Pt hypoxic on arrival at 88% on RA, pt placed on 4LNC. Pt currently receiving immunotherapy for breast CA  Pt has port on the L chest

## 2022-12-03 NOTE — ED Provider Notes (Signed)
[facility] Provider Note    Event Date/Time   First MD Initiated Contact with Patient 12/03/22 0038     (approximate)   History   Shortness of Breath   HPI  Susan [name] is a 66 y.o. female who presents to the ED for evaluation of Shortness of Breath    I review oncology clinic visit from 12/19.  History of breast cancer on the right.  Completed chemotherapy, mastectomy with lymph node biopsy.  Currently getting adjuvant Keytruda.  Was complaining of nonproductive cough for a matter of months at this visit.   Patient presents to the ED via EMS for evaluation of progressively worsening chronic cough and shortness of breath.  Reports multiple weeks of this cough, and has been productive for the past 2 weeks.  She had an outpatient CXR that was done few days ago and was restarted on doxycycline antibiotics.  Reports taking 2 days of this doxycycline.   Despite this, she reports worsening symptoms and sensation of difficulty breathing and not getting enough oxygen  Physical Exam   Triage Vital Signs: ED Triage Vitals  Enc Vitals Group     BP 12/03/22 0012 (!) 165/78     Pulse Rate 12/03/22 0012 (!) 109     Resp 12/03/22 0012 (!) 24     Temp 12/03/22 0012 98.5 F (36.9 C)     Temp Source 12/03/22 0012 Oral     SpO2 12/03/22 0012 (!) 88 %     Weight 12/03/22 0027 119 lb 12.8 oz (54.3 kg)     Height 12/03/22 0027 5' (1.524 m)     Head Circumference --      Peak Flow --      Pain Score --      Pain Loc --      Pain Edu? --      Excl. in [place]? --     Most recent vital signs: Vitals:   12/03/22 0500 12/03/22 0514  BP: 122/69   Pulse: 93   Resp: 17   Temp:  98.3 F (36.8 C)  SpO2: 95%     General: Awake, no distress.  CV:  Good peripheral perfusion.  Tachycardic and regular Resp:  Mild tachypnea to the low 20s.  Not distressed.  Left-sided wheezing and crackles worse than the right Abd:  No distention.  Soft and benign MSK:  No deformity  noted.  Neuro:  No focal deficits appreciated. Other:     ED Results / Procedures / Treatments   Labs (all labs ordered are listed, but only abnormal results are displayed) Labs Reviewed  BASIC METABOLIC PANEL - Abnormal; Notable for the following components:      Result Value   Glucose, Bld 198 (*)    All other components within normal limits  TROPONIN I (HIGH SENSITIVITY) - Abnormal; Notable for the following components:   Troponin I (High Sensitivity) 23 (*)    All other components within normal limits  TROPONIN I (HIGH SENSITIVITY) - Abnormal; Notable for the following components:   Troponin I (High Sensitivity) 25 (*)    All other components within normal limits  RESP PANEL BY RT-PCR (RSV, FLU A&B, COVID)  RVPGX2  CULTURE, BLOOD (ROUTINE X 2)  CULTURE, BLOOD (ROUTINE X 2)  CBC  PROCALCITONIN  LACTIC ACID, PLASMA  LACTIC ACID, PLASMA  HIV ANTIBODY (ROUTINE TESTING W REFLEX)    EKG Sinus tachycardia with a rate of 106 bpm.  Normal axis and  intervals.  For signs of acute ischemia.  From his baseline  RADIOLOGY CXR interpreted by me without evidence of acute cardiopulmonary pathology.  Official radiology report(s): CT Angio Chest PE W and/or Wo Contrast  Result Date: 12/03/2022 CLINICAL DATA:  Right-sided breast cancer with partial right mastectomy 08/16/2022, presents with tachycardia, hypoxia and tachypnea. EXAM: CT ANGIOGRAPHY CHEST WITH CONTRAST TECHNIQUE: Multidetector CT imaging of the chest was performed using the standard protocol during bolus administration of intravenous contrast. Multiplanar CT image reconstructions and MIPs were obtained to evaluate the vascular anatomy. RADIATION DOSE REDUCTION: This exam was performed according to the departmental dose-optimization program which includes automated exposure control, adjustment of the mA and/or kV according to patient size and/or use of iterative reconstruction technique. CONTRAST:  42m OMNIPAQUE IOHEXOL 350  MG/ML SOLN COMPARISON:  Portable chest today, portable chest 11/30/2022 and 09/23/2022, and PET-CT 02/17/2022. FINDINGS: Cardiovascular: Left chest MediPort with IJ approach catheter terminating in the distal SVC. The cardiac size is normal. No coronary calcification or pericardial effusion is seen. There is mild aortic atherosclerosis. There is no aortic dissection, aneurysm or stenosis. The great vessels are clear. Pulmonary arteries are normal caliber without evidence of thromboemboli. Pulmonary veins are decompressed. Mediastinum/Nodes: Mild elevation right hemidiaphragm, unchanged. Small hiatal hernia. There is no intrathoracic or axillary adenopathy. There is increased thickening of the distal thoracic esophagus consistent with esophagitis. Thyroid gland and thoracic trachea are unremarkable. Lungs/Pleura: No pleural effusion, thickening or pneumothorax. There is diffuse increased bronchial thickening. There are scattered segmental and subsegmental bronchial impactions in the left upper and both lower lobes, likely mucoid impactions. There is asymmetric increased haziness in the posterior left lower lobe which could be early bronchopneumonia or related to air trapping. Additional geographic areas of mosaic attenuation in the upper and right lower lobes are most consistent with small airways disease with air trapping. No confluent airspace consolidation is seen. There is mild posterior atelectasis asymmetrically left lower lobe. There is new demonstration of a 6 mm subsolid ground-glass nodule in the right upper lobe apex on 5:33. No other nodules are seen. Upper Abdomen: No acute abnormality. No adrenal mass. Mild hepatic steatosis. Musculoskeletal: There is increased skin thickening over the right breast. Differential diagnosis includes XRT, mastitis, and tumor invasion of dermal lymphatics. There are coarse interstitial changes in the inferior right breast where the hypermetabolic mass was previously noted,  most likely due to postsurgical change. Follow-up as indicated. There is mild thoracic kyphosis and mild anterior wedging of multiple mid to lower thoracic spine vertebral bodies, with spondylosis and bridging enthesopathy. No destructive bone lesion is seen. Review of the MIP images confirms the above findings. IMPRESSION: 1. No evidence of arterial dilatation or embolus. 2. Increased bronchial thickening with scattered segmental and subsegmental bronchial impactions, likely mucoid impactions. 3. Asymmetric increased haziness in the posterior left lower lobe which could be early bronchopneumonia or related to air trapping. 4. Geographic areas of mosaic attenuation in the upper and right lower lobes most consistent with small airways disease with air trapping. 5. New 6 mm subsolid ground-glass nodule in the right upper lobe apex. Initial follow-up with CT at 6 months is recommended to confirm persistence. If persistent, repeat CT is recommended every 2 years until 5 years of stability has been established. This recommendation follows the consensus statement: Guidelines for Management of Incidental Pulmonary Nodules Detected on CT Images: From the Fleischner Society 2017; Radiology 2017; 284:228-243. 6. Increased skin thickening over the right breast. Differential diagnosis includes XRT, mastitis,  and tumor invasion of dermal lymphatics. 7. Aortic atherosclerosis. 8. Small hiatal hernia with increased thickening of the distal thoracic esophagus consistent with esophagitis. 9. Hepatic steatosis. Aortic Atherosclerosis (ICD10-I70.0). Electronically Signed   By: Telford Nab M.D.   On: 12/03/2022 03:03   DG Chest Portable 1 View  Result Date: 12/03/2022 CLINICAL DATA:  Shortness of breath EXAM: PORTABLE CHEST 1 VIEW COMPARISON:  11/30/2022 FINDINGS: Cardiac shadow is stable. Left chest wall port is again seen and stable. Lungs are well aerated bilaterally. No focal infiltrate or effusion is seen. No bony  abnormality is noted. IMPRESSION: No acute abnormality noted. Electronically Signed   By: Inez Catalina M.D.   On: 12/03/2022 01:03    PROCEDURES and INTERVENTIONS:  .1-3 Lead EKG Interpretation  Performed by: Vladimir Crofts, MD Authorized by: Vladimir Crofts, MD     Interpretation: abnormal     ECG rate:  104   ECG rate assessment: tachycardic     Rhythm: sinus tachycardia     Ectopy: none     Conduction: normal   .Critical Care  Performed by: Vladimir Crofts, MD Authorized by: Vladimir Crofts, MD   Critical care provider statement:    Critical care time (minutes):  30   Critical care was necessary to treat or prevent imminent or life-threatening deterioration of the following conditions:  Respiratory failure   Critical care was time spent personally by me on the following activities:  Development of treatment plan with patient or surrogate, discussions with consultants, evaluation of patient's response to treatment, examination of patient, ordering and review of laboratory studies, ordering and review of radiographic studies, ordering and performing treatments and interventions, pulse oximetry, re-evaluation of patient's condition and review of old charts   Medications  atorvastatin (LIPITOR) tablet 80 mg (has no administration in time range)  enalapril (VASOTEC) tablet 20 mg (has no administration in time range)  fenofibrate tablet 160 mg (has no administration in time range)  hydrochlorothiazide (HYDRODIURIL) tablet 12.5 mg (has no administration in time range)  metoprolol succinate (TOPROL-XL) 24 hr tablet 25 mg (has no administration in time range)  DULoxetine (CYMBALTA) DR capsule 60 mg (has no administration in time range)  melatonin tablet 5 mg (has no administration in time range)  gabapentin (NEURONTIN) capsule 300 mg (has no administration in time range)  ascorbic acid (VITAMIN C) tablet 500 mg (has no administration in time range)  benzonatate (TESSALON) capsule 200 mg (has no  administration in time range)  fluticasone (FLONASE) 50 MCG/ACT nasal spray 2 spray (has no administration in time range)  folic acid (FOLVITE) tablet 1 mg (has no administration in time range)  glipiZIDE (GLUCOTROL XL) 24 hr tablet 2.5 mg (has no administration in time range)  enoxaparin (LOVENOX) injection 40 mg (has no administration in time range)  0.9 %  sodium chloride infusion (has no administration in time range)  azithromycin (ZITHROMAX) 500 mg in sodium chloride 0.9 % 250 mL IVPB (has no administration in time range)  acetaminophen (TYLENOL) tablet 650 mg (has no administration in time range)    Or  acetaminophen (TYLENOL) suppository 650 mg (has no administration in time range)  traZODone (DESYREL) tablet 25 mg (has no administration in time range)  magnesium hydroxide (MILK OF MAGNESIA) suspension 30 mL (has no administration in time range)  ondansetron (ZOFRAN) tablet 4 mg (has no administration in time range)    Or  ondansetron (ZOFRAN) injection 4 mg (has no administration in time range)  cefTRIAXone (ROCEPHIN) 1 g in  sodium chloride 0.9 % 100 mL IVPB (has no administration in time range)    Followed by  cefTRIAXone (ROCEPHIN) 2 g in sodium chloride 0.9 % 100 mL IVPB (has no administration in time range)  ipratropium-albuterol (DUONEB) 0.5-2.5 (3) MG/3ML nebulizer solution 3 mL (3 mLs Nebulization Given 12/03/22 0124)  methylPREDNISolone sodium succinate (SOLU-MEDROL) 125 mg/2 mL injection 125 mg (125 mg Intravenous Given 12/03/22 0124)  diphenhydrAMINE (BENADRYL) injection 25 mg (25 mg Intravenous Given 12/03/22 0124)  iohexol (OMNIPAQUE) 350 MG/ML injection 75 mL (60 mLs Intravenous Contrast Given 12/03/22 0212)  cefTRIAXone (ROCEPHIN) 1 g in sodium chloride 0.9 % 100 mL IVPB (0 g Intravenous Stopped 12/03/22 0501)  azithromycin (ZITHROMAX) 500 mg in sodium chloride 0.9 % 250 mL IVPB (0 mg Intravenous Stopped 12/03/22 0602)     IMPRESSION / MDM / ASSESSMENT AND PLAN / ED  COURSE  I reviewed the triage vital signs and the nursing notes.  Differential diagnosis includes, but is not limited to, pneumothorax, pneumonia, PE, viral syndrome, mucous plugging, ACS  {Patient presents with symptoms of an acute illness or injury that is potentially life-threatening.  66 year old presents with worsening chronic cough with evidence of possible pneumonia and sepsis versus viral syndrome and mucous plugging causing hypoxia, ultimately requiring medical admission.  She is tachycardic and tachypneic, with a new O2 requirement requiring nasal cannula.  EKG is nonischemic and troponins are flat.  Remainder of serum workup is quite reassuring with a normal CBC.  Metabolic panel with mild hyperglycemia without acidosis.  Negative procalcitonin is not suggestive of bacterial disease.  CTA done as patient is an active cancer patient with new hypoxia and clear CXR, without evidence of PE.  Possible pneumonia versus just mucous plugging from a viral bronchitis.  Considering her associated hypoxia, we will cover for CAP and consult with medicine for admission      FINAL CLINICAL IMPRESSION(S) / ED DIAGNOSES   Final diagnoses:  Shortness of breath  Hypoxia     Rx / DC Orders   ED Discharge Orders     None        Note:  This document was prepared using Dragon voice recognition software and may include unintentional dictation errors.   Vladimir Crofts, MD 12/03/22 [phone]

## 2022-12-03 NOTE — ED Notes (Signed)
Pt assisted to bedside toilet and then back to bed with monitor reinitiated.

## 2022-12-04 ENCOUNTER — Encounter: Payer: Self-pay | Admitting: Internal Medicine

## 2022-12-04 ENCOUNTER — Encounter: Payer: Self-pay | Admitting: Oncology

## 2022-12-04 DIAGNOSIS — E119 Type 2 diabetes mellitus without complications: Secondary | ICD-10-CM | POA: Diagnosis not present

## 2022-12-04 DIAGNOSIS — J40 Bronchitis, not specified as acute or chronic: Secondary | ICD-10-CM | POA: Diagnosis not present

## 2022-12-04 DIAGNOSIS — E785 Hyperlipidemia, unspecified: Secondary | ICD-10-CM | POA: Diagnosis not present

## 2022-12-04 DIAGNOSIS — I5032 Chronic diastolic (congestive) heart failure: Secondary | ICD-10-CM | POA: Diagnosis not present

## 2022-12-04 LAB — BASIC METABOLIC PANEL
Anion gap: 9 (ref 5–15)
BUN: 25 mg/dL — ABNORMAL HIGH (ref 8–23)
CO2: 29 mmol/L (ref 22–32)
Calcium: 9.2 mg/dL (ref 8.9–10.3)
Chloride: 99 mmol/L (ref 98–111)
Creatinine, Ser: 0.59 mg/dL (ref 0.44–1.00)
GFR, Estimated: >60 mL/min (ref >60)
Glucose, Bld: 139 mg/dL — ABNORMAL HIGH (ref 70–99)
Potassium: 3.8 mmol/L (ref 3.5–5.1)
Sodium: 137 mmol/L (ref 135–145)

## 2022-12-04 LAB — CBC
HCT: 34.8 % — ABNORMAL LOW (ref 36.0–46.0)
Hemoglobin: 11.8 g/dL — ABNORMAL LOW (ref 12.0–15.0)
MCH: 32.7 pg (ref 26.0–34.0)
MCHC: 33.9 g/dL (ref 30.0–36.0)
MCV: 96.4 fL (ref 80.0–100.0)
Platelets: 251 10*3/uL (ref 150–400)
RBC: 3.61 MIL/uL — ABNORMAL LOW (ref 3.87–5.11)
RDW: 13.3 % (ref 11.5–15.5)
WBC: 7.2 10*3/uL (ref 4.0–10.5)
nRBC: 0 % (ref 0.0–0.2)

## 2022-12-04 LAB — GLUCOSE, CAPILLARY
Glucose-Capillary: 281 mg/dL — ABNORMAL HIGH (ref 70–99)
Glucose-Capillary: 185 mg/dL — ABNORMAL HIGH (ref 70–99)
Glucose-Capillary: 98 mg/dL (ref 70–99)
Glucose-Capillary: 169 mg/dL — ABNORMAL HIGH (ref 70–99)

## 2022-12-04 MED ORDER — GLIPIZIDE ER 5 MG PO TB24
5.0000 mg | ORAL_TABLET | Freq: Every day | ORAL | Status: DC
  Administered 2022-12-05: 5 mg via ORAL
  Filled 2022-12-04: qty 1

## 2022-12-04 MED ORDER — AZITHROMYCIN 500 MG PO TABS: 500.0000 mg | ORAL_TABLET | Freq: Every day | ORAL | Status: DC

## 2022-12-04 MED ORDER — AMOXICILLIN-POT CLAVULANATE 875-125 MG PO TABS
1.0000 | ORAL_TABLET | Freq: Two times a day (BID) | ORAL | Status: DC
  Administered 2022-12-05: 1 via ORAL
  Filled 2022-12-04: qty 1

## 2022-12-04 NOTE — Progress Notes (Signed)
PHARMACIST - PHYSICIAN COMMUNICATION  CONCERNING: Antibiotic IV to Oral Route Change Policy  RECOMMENDATION: This patient is receiving azithromycin by the intravenous route.  Based on criteria approved by the Pharmacy and Therapeutics Committee, the antibiotic(s) is/are being converted to the equivalent oral dose form(s).   DESCRIPTION: These criteria include: Patient being treated for a respiratory tract infection, urinary tract infection, cellulitis or clostridium difficile associated diarrhea if on metronidazole The patient is not neutropenic and does not exhibit a GI malabsorption state The patient is eating (either orally or via tube) and/or has been taking other orally administered medications for a least 24 hours The patient is improving clinically and has a Tmax < 100.5  If you have questions about this conversion, please contact the Pharmacy Department   Wilford Merryfield B Dorella Laster 12/04/22    

## 2022-12-04 NOTE — Progress Notes (Signed)
[facility] NAME: Susan [name]    MR#:  [id number]  DATE OF BIRTH:  March 23, 1956  SUBJECTIVE:   Son at bedside. Patient is Spanish speaking. Resting quietly. No respiratory distress. Coughing up some phlegm. No fever. Tolerating PO diet. Vitals stable. Sats 100% on 2 liter.  Does not wear oxygen at home   VITALS:  Blood pressure 117/64, pulse 74, temperature 97.7 F (36.5 C), temperature source Oral, resp. rate 18, height 5' (1.524 m), weight 54.3 kg, SpO2 99 %.  PHYSICAL EXAMINATION:   GENERAL:  67 y.o.-year-old patient lying in the bed with no acute distress.  LUNGS: Normal breath sounds bilaterally, no wheezing CARDIOVASCULAR: S1, S2 normal. No murmur   ABDOMEN: Soft, nontender, nondistended. Bowel sounds present.  EXTREMITIES: No  edema b/l.    NEUROLOGIC: nonfocal  patient is alert and awake SKIN: No obvious rash, lesion, or ulcer.   LABORATORY PANEL:  CBC Recent Labs  Lab 12/04/22 0627  WBC 7.2  HGB 11.8*  HCT 34.8*  PLT 251    Chemistries  Recent Labs  Lab 12/04/22 0627  NA 137  K 3.8  CL 99  CO2 29  GLUCOSE 139*  BUN 25*  CREATININE 0.59  CALCIUM 9.2   Cardiac Enzymes No results for input(s): "TROPONINI" in the last 168 hours. RADIOLOGY:  CT Angio Chest PE W and/or Wo Contrast  Result Date: 12/03/2022 CLINICAL DATA:  Right-sided breast cancer with partial right mastectomy 08/16/2022, presents with tachycardia, hypoxia and tachypnea. EXAM: CT ANGIOGRAPHY CHEST WITH CONTRAST TECHNIQUE: Multidetector CT imaging of the chest was performed using the standard protocol during bolus administration of intravenous contrast. Multiplanar CT image reconstructions and MIPs were obtained to evaluate the vascular anatomy. RADIATION DOSE REDUCTION: This exam was performed according to the departmental dose-optimization program which includes automated exposure control, adjustment of the mA and/or kV according to  patient size and/or use of iterative reconstruction technique. CONTRAST:  71m OMNIPAQUE IOHEXOL 350 MG/ML SOLN COMPARISON:  Portable chest today, portable chest 11/30/2022 and 09/23/2022, and PET-CT 02/17/2022. FINDINGS: Cardiovascular: Left chest MediPort with IJ approach catheter terminating in the distal SVC. The cardiac size is normal. No coronary calcification or pericardial effusion is seen. There is mild aortic atherosclerosis. There is no aortic dissection, aneurysm or stenosis. The great vessels are clear. Pulmonary arteries are normal caliber without evidence of thromboemboli. Pulmonary veins are decompressed. Mediastinum/Nodes: Mild elevation right hemidiaphragm, unchanged. Small hiatal hernia. There is no intrathoracic or axillary adenopathy. There is increased thickening of the distal thoracic esophagus consistent with esophagitis. Thyroid gland and thoracic trachea are unremarkable. Lungs/Pleura: No pleural effusion, thickening or pneumothorax. There is diffuse increased bronchial thickening. There are scattered segmental and subsegmental bronchial impactions in the left upper and both lower lobes, likely mucoid impactions. There is asymmetric increased haziness in the posterior left lower lobe which could be early bronchopneumonia or related to air trapping. Additional geographic areas of mosaic attenuation in the upper and right lower lobes are most consistent with small airways disease with air trapping. No confluent airspace consolidation is seen. There is mild posterior atelectasis asymmetrically left lower lobe. There is new demonstration of a 6 mm subsolid ground-glass nodule in the right upper lobe apex on 5:33. No other nodules are seen. Upper Abdomen: No acute abnormality. No adrenal mass. Mild hepatic steatosis. Musculoskeletal: There is increased skin thickening over the right breast. Differential diagnosis includes XRT, mastitis, and tumor invasion of dermal lymphatics.  There are coarse  interstitial changes in the inferior right breast where the hypermetabolic mass was previously noted, most likely due to postsurgical change. Follow-up as indicated. There is mild thoracic kyphosis and mild anterior wedging of multiple mid to lower thoracic spine vertebral bodies, with spondylosis and bridging enthesopathy. No destructive bone lesion is seen. Review of the MIP images confirms the above findings. IMPRESSION: 1. No evidence of arterial dilatation or embolus. 2. Increased bronchial thickening with scattered segmental and subsegmental bronchial impactions, likely mucoid impactions. 3. Asymmetric increased haziness in the posterior left lower lobe which could be early bronchopneumonia or related to air trapping. 4. Geographic areas of mosaic attenuation in the upper and right lower lobes most consistent with small airways disease with air trapping. 5. New 6 mm subsolid ground-glass nodule in the right upper lobe apex. Initial follow-up with CT at 6 months is recommended to confirm persistence. If persistent, repeat CT is recommended every 2 years until 5 years of stability has been established. This recommendation follows the consensus statement: Guidelines for Management of Incidental Pulmonary Nodules Detected on CT Images: From the Fleischner Society 2017; Radiology 2017; 284:228-243. 6. Increased skin thickening over the right breast. Differential diagnosis includes XRT, mastitis, and tumor invasion of dermal lymphatics. 7. Aortic atherosclerosis. 8. Small hiatal hernia with increased thickening of the distal thoracic esophagus consistent with esophagitis. 9. Hepatic steatosis. Aortic Atherosclerosis (ICD10-I70.0). Electronically Signed   By: Telford Nab M.D.   On: 12/03/2022 03:03   DG Chest Portable 1 View  Result Date: 12/03/2022 CLINICAL DATA:  Shortness of breath EXAM: PORTABLE CHEST 1 VIEW COMPARISON:  11/30/2022 FINDINGS: Cardiac shadow is stable. Left chest wall port is again seen and  stable. Lungs are well aerated bilaterally. No focal infiltrate or effusion is seen. No bony abnormality is noted. IMPRESSION: No acute abnormality noted. Electronically Signed   By: Inez Catalina M.D.   On: 12/03/2022 01:03    Assessment and [place] is a 67 y.o. female with medical history significant of hypertension, hyperlipidemia, diabetes mellitus, diastolic CHF, depression with anxiety, pancreatitis, gastritis, vitamin B12 deficiency, right breast cancer (s/p of her mastectomy, on Keytruda), who presents with shortness breath.  he has cough with yellow-colored sputum production.  She has chest pressure feeling sometimes, denies active chest pain to me.  No fever or chills.  She is not using oxygen normally, but was found to have oxygen desaturation to 88% on room air, which improved to 95% on 4 L oxygen.  Pt is 100% on 2 liters  Acute bronchitis with transient hypoxia --no obvious consolidation on CXR  --mucous plugging noted on CT chest --no respiratory distress --sats 100% on 2 liters--wean her off oxygen --change to po abxs --pro-calcitonin neg --wbc normal, BC neg  Chronic diastolic CHF (congestive heart failure) ([place]): 2D echo on 05/22/2021 showed EF of 60 to 65% with grade 1 diastolic dysfunction.  Patient does not have leg edema.  Does not seem to have CHF exacerbation. -check BNP   Essential hypertension: -Enalapril, metoprolol -IV hydralazine as needed   Diabetes mellitus without complication [facility]): Recent A1c 6.6, well-controlled.  Patient is taking glipizide. -Sliding scale insulin   HLD (hyperlipidemia) -Fenofibrate, Lipitor   Depression with anxiety: -Continue home medications   Myocardial injury: Troponin level 23, 25, 15 --Aspirin, Lipitor, fenofibrate  No cp, EKG no acute changes   Malignant neoplasm of lower-inner quadrant of right breast of female, estrogen receptor negative ([place]): S/p of mastectomy currently  on Keytruda -Follow-up with Dr.  Janese Banks of oncology   Lung nodule: CTA showed 6 mm of nodule on right upper lobe -This needs to be followed up with PCP and oncology to make sure it is not metastasized disease     DVT ppx:  SQ Lovenox   Code Status: Full code   Family Communication:   Yes, patient's grandson   at bed side.     Level of care: Telemetry Medical Status is: Inpatient Remains inpatient appropriate because: bronchitis If remains stable, d/c home 12/05/22    TOTAL TIME TAKING CARE OF THIS PATIENT: 35 minutes.  >50% time spent on counselling and coordination of care  Note: This dictation was prepared with Dragon dictation along with smaller phrase technology. Any transcriptional errors that result from this process are unintentional.  Fritzi Mandes M.D    [facility]   CC: Primary care physician; Frazier Richards, MD

## 2022-12-05 ENCOUNTER — Ambulatory Visit: Payer: Medicare HMO

## 2022-12-05 DIAGNOSIS — I10 Essential (primary) hypertension: Secondary | ICD-10-CM | POA: Diagnosis not present

## 2022-12-05 DIAGNOSIS — I5032 Chronic diastolic (congestive) heart failure: Secondary | ICD-10-CM | POA: Diagnosis not present

## 2022-12-05 DIAGNOSIS — C50311 Malignant neoplasm of lower-inner quadrant of right female breast: Secondary | ICD-10-CM | POA: Diagnosis not present

## 2022-12-05 DIAGNOSIS — J40 Bronchitis, not specified as acute or chronic: Secondary | ICD-10-CM | POA: Diagnosis not present

## 2022-12-05 LAB — GLUCOSE, CAPILLARY: Glucose-Capillary: 155 mg/dL — ABNORMAL HIGH (ref 70–99)

## 2022-12-05 LAB — HEMOGLOBIN A1C
Hgb A1c MFr Bld: 7.1 % — ABNORMAL HIGH (ref 4.8–5.6)
Mean Plasma Glucose: 157 mg/dL

## 2022-12-05 MED ORDER — DM-GUAIFENESIN ER 30-600 MG PO TB12: 1.0000 | ORAL_TABLET | Freq: Two times a day (BID) | ORAL | 0 refills | Status: DC

## 2022-12-05 MED ORDER — AMOXICILLIN-POT CLAVULANATE 875-125 MG PO TABS: 1.0000 | ORAL_TABLET | Freq: Two times a day (BID) | ORAL | 0 refills | Status: AC

## 2022-12-05 MED ORDER — ASPIRIN 81 MG PO TBEC: 81.0000 mg | DELAYED_RELEASE_TABLET | Freq: Every day | ORAL | 0 refills | Status: AC

## 2022-12-05 NOTE — Telephone Encounter (Signed)
Please look into this

## 2022-12-05 NOTE — TOC CM/SW Note (Signed)
  Transition of Care [facility]) Screening Note   Patient Details  Name: Susan [name] Date of Birth: November 11, 1956   Transition of Care [facility]) CM/SW Contact:    Gerilyn Pilgrim, LCSW Phone Number: 12/05/2022, 10:47 AM    Transition of Care Department [facility]) has reviewed patient and no TOC needs have been identified at this time. We will continue to monitor patient advancement through interdisciplinary progression rounds. If new patient transition needs arise, please place a TOC consult.

## 2022-12-05 NOTE — Care Management Important Message (Signed)
Important Message  Patient Details  Name: Susan [name] MRN: [id number] Date of Birth: August 21, 1956   Medicare Important Message Given:  N/A - LOS <3 / Initial given by admissions     Juliann Pulse A Cammy Sanjurjo 12/05/2022, 11:18 AM

## 2022-12-05 NOTE — Discharge Summary (Signed)
Physician Discharge Summary   Patient: Susan [name] MRN: [id number] DOB: [birth year]  Admit date:     12/03/2022  Discharge date: 12/05/22  Discharge Physician: Fritzi Mandes   PCP: Frazier Richards, MD   Recommendations at discharge:   follow-up PCP in 1 to 2 weeks  Discharge Diagnoses: Acute Bronchitis/Bronchopneumonia  Assessment and Plan  Susan [name] is a 67 y.o. female with medical history significant of hypertension, hyperlipidemia, diabetes mellitus, diastolic CHF, depression with anxiety, pancreatitis, gastritis, vitamin B12 deficiency, right breast cancer (s/p of her mastectomy, on Keytruda), who presents with shortness breath.  he has cough with yellow-colored sputum production.  She has chest pressure feeling sometimes, denies active chest pain to me.  No fever or chills.  She is not using oxygen normally, but was found to have oxygen desaturation to 88% on room air, which improved to 95% on 4 L oxygen.  Pt is 100% on 2 liters   Acute bronchitis with transient hypoxia --no obvious consolidation on CXR  --mucous plugging noted on CT chest --no respiratory distress --sats 100% on 2 liters--wean her off oxygen --change to po abxs --pro-calcitonin neg --wbc normal, BC neg   Chronic diastolic CHF (congestive heart failure) ([place]): 2D echo on 05/22/2021 showed EF of 60 to 65% with grade 1 diastolic dysfunction.  Patient does not have leg edema.  Does not seem to have CHF exacerbation. -BNP 14.7   Essential hypertension: -Enalapril, metoprolol -IV hydralazine as needed   Diabetes mellitus without complication [facility]): Recent A1c 6.6, well-controlled.  Patient is taking glipizide. -Sliding scale insulin   HLD (hyperlipidemia) -Fenofibrate, Lipitor   Depression with anxiety: -Continue home medications   Myocardial injury: Troponin level 23, 25, 15 --Aspirin, Lipitor, fenofibrate  No cp, EKG no acute changes   Malignant neoplasm of lower-inner quadrant of right  breast of female, estrogen receptor negative ([place]): S/p of mastectomy currently on Keytruda -Follow-up with Dr. Janese Banks of oncology   Lung nodule: CTA showed 6 mm of nodule on right upper lobe -This needs to be followed up with PCP and oncology to make sure it is not metastasized disease     DVT ppx:  SQ Lovenox   Code Status: Full code   Family Communication:   Yes, patient's dter  at bed side via video spanish interpreter     Disposition: Home Diet recommendation:  Discharge Diet Orders (From admission, onward)     Start     Ordered   12/05/22 0000  Diet - low sodium heart healthy        12/05/22 1013           Cardiac diet DISCHARGE MEDICATION: Allergies as of 12/05/2022   No Active Allergies      Medication List     STOP taking these medications    doxycycline 100 MG capsule Commonly known as: VIBRAMYCIN   guaiFENesin-codeine 100-10 MG/5ML syrup Commonly known as: ROBITUSSIN AC       TAKE these medications    amoxicillin-clavulanate 875-125 MG tablet Commonly known as: AUGMENTIN Take 1 tablet by mouth every 12 (twelve) hours for 6 days.   ascorbic acid 500 MG tablet Commonly known as: VITAMIN C Take 500 mg by mouth daily.   aspirin EC 81 MG tablet Take 1 tablet (81 mg total) by mouth daily.   atorvastatin 80 MG tablet Commonly known as: LIPITOR Take 80 mg by mouth at bedtime.   benzonatate 200 MG capsule Commonly known as: TESSALON Take 1  capsule (200 mg total) by mouth 3 (three) times daily as needed for cough.   dextromethorphan-guaiFENesin 30-600 MG 12hr tablet Commonly known as: MUCINEX DM Take 1 tablet by mouth 2 (two) times daily.   DULoxetine 60 MG capsule Commonly known as: CYMBALTA Take 60 mg by mouth at bedtime.   enalapril 20 MG tablet Commonly known as: VASOTEC Take 20 mg by mouth every morning.   fenofibrate 145 MG tablet Commonly known as: TRICOR Take 145 mg by mouth every morning.   fluticasone 50 MCG/ACT nasal  spray Commonly known as: FLONASE Place 1-2 sprays into both nostrils daily as needed for allergies or rhinitis.   gabapentin 300 MG capsule Commonly known as: NEURONTIN Take 300 mg by mouth 3 (three) times daily.   glipiZIDE 5 MG 24 hr tablet Commonly known as: GLUCOTROL XL Take 5 mg by mouth daily with breakfast.   hydrochlorothiazide 12.5 MG capsule Commonly known as: MICROZIDE Take 12.5 mg by mouth daily.   Melatonin 5 MG Caps Take 5 mg by mouth at bedtime as needed (sleep).   metoprolol succinate 25 MG 24 hr tablet Commonly known as: Toprol XL Take 1 tablet (25 mg total) by mouth daily.   albuterol (2.5 MG/3ML) 0.083% nebulizer solution Commonly known as: PROVENTIL Take 2.5 mg by nebulization every 4 (four) hours as needed for wheezing or shortness of breath.   Ventolin HFA 108 (90 Base) MCG/ACT inhaler Generic drug: albuterol Inhale 2 puffs by mouth into the lungs every 6 hours as needed for difficulty breathing.        Follow-up Information     Frazier Richards, MD. Schedule an appointment as soon as possible for a visit in 1 week(s).   Specialty: Family Medicine Why: hospital f/u Contact information: [place] [address] [phone]                Discharge Exam: Danley Danker Weights   12/03/22 0027  Weight: 54.3 kg   GENERAL:  67 y.o.-year-old patient lying in the bed with no acute distress.  LUNGS: Normal breath sounds bilaterally, no wheezing CARDIOVASCULAR: S1, S2 normal. No murmur   ABDOMEN: Soft, nontender, nondistended. Bowel sounds present.  EXTREMITIES: No  edema b/l.    NEUROLOGIC: nonfocal  patient is alert and awake SKIN: No obvious rash, lesion, or ulcer.     Condition at discharge: fair  The results of significant diagnostics from this hospitalization (including imaging, microbiology, ancillary and laboratory) are listed below for reference.   Imaging Studies: CT Angio Chest PE W and/or Wo Contrast  Result Date:  12/03/2022 CLINICAL DATA:  Right-sided breast cancer with partial right mastectomy 08/16/2022, presents with tachycardia, hypoxia and tachypnea. EXAM: CT ANGIOGRAPHY CHEST WITH CONTRAST TECHNIQUE: Multidetector CT imaging of the chest was performed using the standard protocol during bolus administration of intravenous contrast. Multiplanar CT image reconstructions and MIPs were obtained to evaluate the vascular anatomy. RADIATION DOSE REDUCTION: This exam was performed according to the departmental dose-optimization program which includes automated exposure control, adjustment of the mA and/or kV according to patient size and/or use of iterative reconstruction technique. CONTRAST:  68m OMNIPAQUE IOHEXOL 350 MG/ML SOLN COMPARISON:  Portable chest today, portable chest 11/30/2022 and 09/23/2022, and PET-CT 02/17/2022. FINDINGS: Cardiovascular: Left chest MediPort with IJ approach catheter terminating in the distal SVC. The cardiac size is normal. No coronary calcification or pericardial effusion is seen. There is mild aortic atherosclerosis. There is no aortic dissection, aneurysm or stenosis. The great vessels are clear.  Pulmonary arteries are normal caliber without evidence of thromboemboli. Pulmonary veins are decompressed. Mediastinum/Nodes: Mild elevation right hemidiaphragm, unchanged. Small hiatal hernia. There is no intrathoracic or axillary adenopathy. There is increased thickening of the distal thoracic esophagus consistent with esophagitis. Thyroid gland and thoracic trachea are unremarkable. Lungs/Pleura: No pleural effusion, thickening or pneumothorax. There is diffuse increased bronchial thickening. There are scattered segmental and subsegmental bronchial impactions in the left upper and both lower lobes, likely mucoid impactions. There is asymmetric increased haziness in the posterior left lower lobe which could be early bronchopneumonia or related to air trapping. Additional geographic areas of  mosaic attenuation in the upper and right lower lobes are most consistent with small airways disease with air trapping. No confluent airspace consolidation is seen. There is mild posterior atelectasis asymmetrically left lower lobe. There is new demonstration of a 6 mm subsolid ground-glass nodule in the right upper lobe apex on 5:33. No other nodules are seen. Upper Abdomen: No acute abnormality. No adrenal mass. Mild hepatic steatosis. Musculoskeletal: There is increased skin thickening over the right breast. Differential diagnosis includes XRT, mastitis, and tumor invasion of dermal lymphatics. There are coarse interstitial changes in the inferior right breast where the hypermetabolic mass was previously noted, most likely due to postsurgical change. Follow-up as indicated. There is mild thoracic kyphosis and mild anterior wedging of multiple mid to lower thoracic spine vertebral bodies, with spondylosis and bridging enthesopathy. No destructive bone lesion is seen. Review of the MIP images confirms the above findings. IMPRESSION: 1. No evidence of arterial dilatation or embolus. 2. Increased bronchial thickening with scattered segmental and subsegmental bronchial impactions, likely mucoid impactions. 3. Asymmetric increased haziness in the posterior left lower lobe which could be early bronchopneumonia or related to air trapping. 4. Geographic areas of mosaic attenuation in the upper and right lower lobes most consistent with small airways disease with air trapping. 5. New 6 mm subsolid ground-glass nodule in the right upper lobe apex. Initial follow-up with CT at 6 months is recommended to confirm persistence. If persistent, repeat CT is recommended every 2 years until 5 years of stability has been established. This recommendation follows the consensus statement: Guidelines for Management of Incidental Pulmonary Nodules Detected on CT Images: From the Fleischner Society 2017; Radiology 2017; 284:228-243. 6.  Increased skin thickening over the right breast. Differential diagnosis includes XRT, mastitis, and tumor invasion of dermal lymphatics. 7. Aortic atherosclerosis. 8. Small hiatal hernia with increased thickening of the distal thoracic esophagus consistent with esophagitis. 9. Hepatic steatosis. Aortic Atherosclerosis (ICD10-I70.0). Electronically Signed   By: Telford Nab M.D.   On: 12/03/2022 03:03   DG Chest 2 View  Result Date: 12/03/2022 CLINICAL DATA:  Chronic cough EXAM: CHEST - 2 VIEW COMPARISON:  09/23/2022 FINDINGS: Cardiac shadow is within normal limits. Left chest wall port is noted in satisfactory position. Lungs are well aerated bilaterally. No focal infiltrate or effusion is seen. Degenerative changes of the thoracic spine are noted. Mild chronic interstitial changes are seen. IMPRESSION: No acute abnormality noted. Electronically Signed   By: Inez Catalina M.D.   On: 12/03/2022 01:03   DG Chest Portable 1 View  Result Date: 12/03/2022 CLINICAL DATA:  Shortness of breath EXAM: PORTABLE CHEST 1 VIEW COMPARISON:  11/30/2022 FINDINGS: Cardiac shadow is stable. Left chest wall port is again seen and stable. Lungs are well aerated bilaterally. No focal infiltrate or effusion is seen. No bony abnormality is noted. IMPRESSION: No acute abnormality noted. Electronically Signed   By:  Inez Catalina M.D.   On: 12/03/2022 01:03    Microbiology: Results for orders placed or performed during the hospital encounter of 12/03/22  Resp panel by RT-PCR (RSV, Flu A&B, Covid) Anterior Nasal Swab     Status: None   Collection Time: 12/03/22  1:02 AM   Specimen: Anterior Nasal Swab  Result Value Ref Range Status   SARS Coronavirus 2 by RT PCR NEGATIVE NEGATIVE Final    Comment: (NOTE) SARS-CoV-2 target nucleic acids are NOT DETECTED.  The SARS-CoV-2 RNA is generally detectable in upper respiratory specimens during the acute phase of infection. The lowest concentration of SARS-CoV-2 viral copies this  assay can detect is 138 copies/mL. A negative result does not preclude SARS-Cov-2 infection and should not be used as the sole basis for treatment or other patient management decisions. A negative result may occur with  improper specimen collection/handling, submission of specimen other than nasopharyngeal swab, presence of viral mutation(s) within the areas targeted by this assay, and inadequate number of viral copies(<138 copies/mL). A negative result must be combined with clinical observations, patient history, and epidemiological information. The expected result is Negative.  Fact Sheet for Patients:  EntrepreneurPulse.com.au  Fact Sheet for Healthcare Providers:  IncredibleEmployment.be  This test is no t yet approved or cleared by the [place] FDA and  has been authorized for detection and/or diagnosis of SARS-CoV-2 by FDA under an Emergency Use Authorization (EUA). This EUA will remain  in effect (meaning this test can be used) for the duration of the COVID-19 declaration under Section 564(b)(1) of the Act, 21 U.S.C.section 360bbb-3(b)(1), unless the authorization is terminated  or revoked sooner.       Influenza A by PCR NEGATIVE NEGATIVE Final   Influenza B by PCR NEGATIVE NEGATIVE Final    Comment: (NOTE) The Xpert Xpress SARS-CoV-2/FLU/RSV plus assay is intended as an aid in the diagnosis of influenza from Nasopharyngeal swab specimens and should not be used as a sole basis for treatment. Nasal washings and aspirates are unacceptable for Xpert Xpress SARS-CoV-2/FLU/RSV testing.  Fact Sheet for Patients: EntrepreneurPulse.com.au  Fact Sheet for Healthcare Providers: IncredibleEmployment.be  This test is not yet approved or cleared by the [place] FDA and has been authorized for detection and/or diagnosis of SARS-CoV-2 by FDA under an Emergency Use Authorization (EUA). This EUA will  remain in effect (meaning this test can be used) for the duration of the COVID-19 declaration under Section 564(b)(1) of the Act, 21 U.S.C. section 360bbb-3(b)(1), unless the authorization is terminated or revoked.     Resp Syncytial Virus by PCR NEGATIVE NEGATIVE Final    Comment: (NOTE) Fact Sheet for Patients: EntrepreneurPulse.com.au  Fact Sheet for Healthcare Providers: IncredibleEmployment.be  This test is not yet approved or cleared by the [place] FDA and has been authorized for detection and/or diagnosis of SARS-CoV-2 by FDA under an Emergency Use Authorization (EUA). This EUA will remain in effect (meaning this test can be used) for the duration of the COVID-19 declaration under Section 564(b)(1) of the Act, 21 U.S.C. section 360bbb-3(b)(1), unless the authorization is terminated or revoked.  Performed at [facility], [address] [place], [place] [address]   Blood culture (routine x 2)     Status: None (Preliminary result)   Collection Time: 12/03/22  1:02 AM   Specimen: BLOOD  Result Value Ref Range Status   Specimen Description BLOOD RIGHT ANTECUBITAL  Final   Special Requests   Final    BOTTLES DRAWN AEROBIC AND  ANAEROBIC Blood Culture adequate volume   Culture   Final    NO GROWTH 2 DAYS Performed at [facility], [address] [place], [place] [address]    Report Status PENDING  Incomplete  Blood culture (routine x 2)     Status: None (Preliminary result)   Collection Time: 12/03/22  1:02 AM   Specimen: BLOOD  Result Value Ref Range Status   Specimen Description BLOOD BLOOD LEFT ARM  Final   Special Requests   Final    BOTTLES DRAWN AEROBIC AND ANAEROBIC Blood Culture adequate volume   Culture   Final    NO GROWTH 2 DAYS Performed at [facility], [address] [place], [place] [address]    Report Status PENDING  Incomplete    Labs: CBC: Recent Labs  Lab 12/03/22 0102  12/04/22 0627  WBC 6.5 7.2  HGB 12.5 11.8*  HCT 37.5 34.8*  MCV 96.6 96.4  PLT 243 546   Basic Metabolic Panel: Recent Labs  Lab 12/03/22 0102 12/04/22 0627  NA 137 137  K 3.8 3.8  CL 99 99  CO2 29 29  GLUCOSE 198* 139*  BUN 21 25*  CREATININE 0.69 0.59  CALCIUM 9.8 9.2   CBG: Recent Labs  Lab 12/04/22 0929 12/04/22 1206 12/04/22 1728 12/04/22 2007 12/05/22 0800  GLUCAP 281* 185* 98 169* 155*    Discharge time spent: greater than 30 minutes.  Signed: Fritzi Mandes, MD [facility] 12/05/2022

## 2022-12-06 ENCOUNTER — Ambulatory Visit: Payer: Medicare HMO

## 2022-12-06 ENCOUNTER — Ambulatory Visit
Admission: RE | Admit: 2022-12-06 | Discharge: 2022-12-06 | Disposition: A | Discharge status: Home / Self Care | Payer: Medicare HMO | Source: Ambulatory Visit | Attending: Radiation Oncology | Admitting: Radiation Oncology

## 2022-12-06 ENCOUNTER — Other Ambulatory Visit: Payer: Self-pay

## 2022-12-06 DIAGNOSIS — Z5112 Encounter for antineoplastic immunotherapy: Secondary | ICD-10-CM | POA: Insufficient documentation

## 2022-12-06 DIAGNOSIS — C50312 Malignant neoplasm of lower-inner quadrant of left female breast: Secondary | ICD-10-CM | POA: Insufficient documentation

## 2022-12-06 DIAGNOSIS — Z79899 Other long term (current) drug therapy: Secondary | ICD-10-CM | POA: Diagnosis not present

## 2022-12-06 DIAGNOSIS — Z171 Estrogen receptor negative status [ER-]: Secondary | ICD-10-CM | POA: Insufficient documentation

## 2022-12-06 DIAGNOSIS — C50311 Malignant neoplasm of lower-inner quadrant of right female breast: Secondary | ICD-10-CM | POA: Diagnosis present

## 2022-12-06 LAB — LEGIONELLA PNEUMOPHILA SEROGP 1 UR AG: L. pneumophila Serogp 1 Ur Ag: NEGATIVE

## 2022-12-07 ENCOUNTER — Other Ambulatory Visit: Payer: Self-pay

## 2022-12-07 ENCOUNTER — Ambulatory Visit
Admission: RE | Admit: 2022-12-07 | Discharge: 2022-12-07 | Disposition: A | Discharge status: Home / Self Care | Payer: Medicare HMO | Source: Ambulatory Visit | Attending: Radiation Oncology | Admitting: Radiation Oncology

## 2022-12-07 DIAGNOSIS — Z5112 Encounter for antineoplastic immunotherapy: Secondary | ICD-10-CM | POA: Diagnosis not present

## 2022-12-07 LAB — RAD ONC ARIA SESSION SUMMARY
Course Elapsed Days: 0
Plan Fractions Treated to Date: 1
Plan Prescribed Dose Per Fraction: 2.66 Gy
Plan Total Fractions Prescribed: 16
Plan Total Prescribed Dose: 42.56 Gy
Reference Point Dosage Given to Date: 2.66 Gy
Reference Point Session Dosage Given: 2.66 Gy
Session Number: 1

## 2022-12-08 ENCOUNTER — Ambulatory Visit
Admission: RE | Admit: 2022-12-08 | Discharge: 2022-12-08 | Disposition: A | Discharge status: Home / Self Care | Payer: Medicare HMO | Source: Ambulatory Visit | Attending: Radiation Oncology | Admitting: Radiation Oncology

## 2022-12-08 ENCOUNTER — Other Ambulatory Visit: Payer: Self-pay

## 2022-12-08 DIAGNOSIS — Z5112 Encounter for antineoplastic immunotherapy: Secondary | ICD-10-CM | POA: Diagnosis not present

## 2022-12-08 LAB — RAD ONC ARIA SESSION SUMMARY
Course Elapsed Days: 1
Plan Fractions Treated to Date: 2
Plan Prescribed Dose Per Fraction: 2.66 Gy
Plan Total Fractions Prescribed: 16
Plan Total Prescribed Dose: 42.56 Gy
Reference Point Dosage Given to Date: 5.32 Gy
Reference Point Session Dosage Given: 2.66 Gy
Session Number: 2

## 2022-12-08 LAB — CULTURE, BLOOD (ROUTINE X 2)
Culture: NO GROWTH
Culture: NO GROWTH
Special Requests: ADEQUATE
Special Requests: ADEQUATE

## 2022-12-11 ENCOUNTER — Other Ambulatory Visit: Payer: Self-pay

## 2022-12-11 ENCOUNTER — Ambulatory Visit
Admission: RE | Admit: 2022-12-11 | Discharge: 2022-12-11 | Disposition: A | Discharge status: Home / Self Care | Payer: Medicare HMO | Source: Ambulatory Visit | Attending: Radiation Oncology | Admitting: Radiation Oncology

## 2022-12-11 DIAGNOSIS — Z5112 Encounter for antineoplastic immunotherapy: Secondary | ICD-10-CM | POA: Diagnosis not present

## 2022-12-11 LAB — RAD ONC ARIA SESSION SUMMARY
Course Elapsed Days: 4
Plan Fractions Treated to Date: 3
Plan Prescribed Dose Per Fraction: 2.66 Gy
Plan Total Fractions Prescribed: 16
Plan Total Prescribed Dose: 42.56 Gy
Reference Point Dosage Given to Date: 7.98 Gy
Reference Point Session Dosage Given: 2.66 Gy
Session Number: 3

## 2022-12-12 ENCOUNTER — Ambulatory Visit
Admission: RE | Admit: 2022-12-12 | Discharge: 2022-12-12 | Disposition: A | Discharge status: Home / Self Care | Payer: Medicare HMO | Source: Ambulatory Visit | Attending: Radiation Oncology | Admitting: Radiation Oncology

## 2022-12-12 ENCOUNTER — Inpatient Hospital Stay (HOSPITAL_BASED_OUTPATIENT_CLINIC_OR_DEPARTMENT_OTHER): Payer: Medicare HMO | Admitting: Oncology

## 2022-12-12 ENCOUNTER — Inpatient Hospital Stay: Payer: Medicare HMO | Attending: Oncology

## 2022-12-12 ENCOUNTER — Encounter: Payer: Self-pay | Admitting: Oncology

## 2022-12-12 ENCOUNTER — Other Ambulatory Visit: Payer: Self-pay

## 2022-12-12 ENCOUNTER — Inpatient Hospital Stay: Payer: Medicare HMO

## 2022-12-12 VITALS — BP 119/75 | HR 86 | Temp 99.0°F | Wt 120.4 lb

## 2022-12-12 DIAGNOSIS — R058 Other specified cough: Secondary | ICD-10-CM

## 2022-12-12 DIAGNOSIS — Z5112 Encounter for antineoplastic immunotherapy: Secondary | ICD-10-CM | POA: Diagnosis not present

## 2022-12-12 DIAGNOSIS — C50311 Malignant neoplasm of lower-inner quadrant of right female breast: Secondary | ICD-10-CM | POA: Insufficient documentation

## 2022-12-12 DIAGNOSIS — Z171 Estrogen receptor negative status [ER-]: Secondary | ICD-10-CM | POA: Insufficient documentation

## 2022-12-12 DIAGNOSIS — Z79899 Other long term (current) drug therapy: Secondary | ICD-10-CM | POA: Insufficient documentation

## 2022-12-12 LAB — T4, FREE: Free T4: 0.82 ng/dL (ref 0.61–1.12)

## 2022-12-12 LAB — COMPREHENSIVE METABOLIC PANEL
ALT: 24 U/L (ref 0–44)
AST: 26 U/L (ref 15–41)
Albumin: 3.8 g/dL (ref 3.5–5.0)
Alkaline Phosphatase: 63 U/L (ref 38–126)
Anion gap: 10 (ref 5–15)
BUN: 24 mg/dL — ABNORMAL HIGH (ref 8–23)
CO2: 27 mmol/L (ref 22–32)
Calcium: 8.7 mg/dL — ABNORMAL LOW (ref 8.9–10.3)
Chloride: 99 mmol/L (ref 98–111)
Creatinine, Ser: 0.71 mg/dL (ref 0.44–1.00)
GFR, Estimated: >60 mL/min (ref >60)
Glucose, Bld: 235 mg/dL — ABNORMAL HIGH (ref 70–99)
Potassium: 4 mmol/L (ref 3.5–5.1)
Sodium: 136 mmol/L (ref 135–145)
Total Bilirubin: 0.5 mg/dL (ref 0.3–1.2)
Total Protein: 6.1 g/dL — ABNORMAL LOW (ref 6.5–8.1)

## 2022-12-12 LAB — TSH: TSH: 2.286 u[IU]/mL (ref 0.350–4.500)

## 2022-12-12 LAB — CBC WITH DIFFERENTIAL/PLATELET
Abs Immature Granulocytes: 0.02 10*3/uL (ref 0.00–0.07)
Basophils Absolute: 0 10*3/uL (ref 0.0–0.1)
Basophils Relative: 1 %
Eosinophils Absolute: 1.1 10*3/uL — ABNORMAL HIGH (ref 0.0–0.5)
Eosinophils Relative: 16 %
HCT: 34.2 % — ABNORMAL LOW (ref 36.0–46.0)
Hemoglobin: 11.7 g/dL — ABNORMAL LOW (ref 12.0–15.0)
Immature Granulocytes: 0 %
Lymphocytes Relative: 18 %
Lymphs Abs: 1.2 10*3/uL (ref 0.7–4.0)
MCH: 32.7 pg (ref 26.0–34.0)
MCHC: 34.2 g/dL (ref 30.0–36.0)
MCV: 95.5 fL (ref 80.0–100.0)
Monocytes Absolute: 0.5 10*3/uL (ref 0.1–1.0)
Monocytes Relative: 8 %
Neutro Abs: 3.7 10*3/uL (ref 1.7–7.7)
Neutrophils Relative %: 57 %
Platelets: 243 10*3/uL (ref 150–400)
RBC: 3.58 MIL/uL — ABNORMAL LOW (ref 3.87–5.11)
RDW: 13.2 % (ref 11.5–15.5)
WBC: 6.6 10*3/uL (ref 4.0–10.5)
nRBC: 0 % (ref 0.0–0.2)

## 2022-12-12 LAB — RAD ONC ARIA SESSION SUMMARY
Course Elapsed Days: 5
Plan Fractions Treated to Date: 4
Plan Prescribed Dose Per Fraction: 2.66 Gy
Plan Total Fractions Prescribed: 16
Plan Total Prescribed Dose: 42.56 Gy
Reference Point Dosage Given to Date: 10.64 Gy
Reference Point Session Dosage Given: 2.66 Gy
Session Number: 4

## 2022-12-12 MED ORDER — SODIUM CHLORIDE 0.9 % IV SOLN
200.0000 mg | Freq: Once | INTRAVENOUS | Status: AC
  Administered 2022-12-12: 200 mg via INTRAVENOUS
  Filled 2022-12-12: qty 200

## 2022-12-12 MED ORDER — TRAZODONE HCL 50 MG PO TABS: 50.0000 mg | ORAL_TABLET | Freq: Every day | ORAL | 1 refills | Status: DC

## 2022-12-12 MED ORDER — SODIUM CHLORIDE 0.9% FLUSH
10.0000 mL | INTRAVENOUS | Status: DC | PRN
  Administered 2022-12-12: 10 mL
  Filled 2022-12-12: qty 10

## 2022-12-12 MED ORDER — HEPARIN SOD (PORK) LOCK FLUSH 100 UNIT/ML IV SOLN
500.0000 [IU] | Freq: Once | INTRAVENOUS | Status: AC | PRN
  Administered 2022-12-12: 500 [IU]
  Filled 2022-12-12: qty 5

## 2022-12-12 MED ORDER — SODIUM CHLORIDE 0.9 % IV SOLN
Freq: Once | INTRAVENOUS | Status: AC
  Filled 2022-12-12: qty 250

## 2022-12-12 NOTE — Patient Instructions (Signed)
Instrucciones al darle de alta: Discharge Instructions Gracias por elegir al [facility] de Cncer de [place] para brindarle atencin mdica de oncologa y Music therapist.   Si usted tiene una cita de laboratorio con [place], por favor vaya directamente [place] y regstrese en el rea de Control and instrumentation engineer.   Use ropa cmoda y [place] para tener fcil acceso a las vas del Portacath (acceso venoso de Engineer, site duracin) o la lnea PICC (catter central colocado por va perifrica).   Nos esforzamos por ofrecerle tiempo de calidad con su proveedor. Es posible que tenga que volver a programar su cita si llega tarde (15 minutos o ms).  El llegar tarde le afecta a usted y a otros pacientes cuyas citas son posteriores a Merchandiser, retail.  Adems, si usted falta a tres o ms citas sin avisar a la oficina, puede ser retirado(a) de la clnica a discrecin del proveedor.      Para las solicitudes de renovacin de recetas, pida a su farmacia que se ponga en contacto con nuestra oficina y deje que transcurran 48 horas para que se complete el proceso de las renovaciones.    Hoy usted recibi los siguientes agentes de quimioterapia e/o inmunoterapia- Keytruda      Para ayudar a prevenir las nuseas y los vmitos despus de su tratamiento, le recomendamos que tome su medicamento para las nuseas segn las indicaciones.  LOS SNTOMAS QUE DEBEN COMUNICARSE INMEDIATAMENTE SE INDICAN A CONTINUACIN: *FIEBRE SUPERIOR A 100.4 F (38 C) O MS *ESCALOFROS O SUDORACIN *NUSEAS Y VMITOS QUE NO SE CONTROLAN CON EL MEDICAMENTO PARA LAS NUSEAS *DIFICULTAD INUSUAL PARA RESPIRAR  *MORETONES O HEMORRAGIAS NO HABITUALES *PROBLEMAS URINARIOS (dolor o ardor al Garment/textile technologist o frecuencia para Garment/textile technologist) *PROBLEMAS INTESTINALES (diarrea inusual, estreimiento, dolor cerca del ano) SENSIBILIDAD EN LA BOCA Y EN LA GARGANTA CON O SIN LA PRESENCIA DE LCERAS (dolor de garganta, llagas en la boca o dolor de muelas/dientes) ERUPCIN,  HINCHAZN O DOLORES INUSUALES FLUJO VAGINAL INUSUAL O PICAZN/RASQUIA    Los puntos marcados con un asterisco ( *) indican una posible emergencia y debe hacer un seguimiento tan pronto como le sea posible o vaya al Departamento de Emergencias si se le presenta algn problema.  Por favor, muestre la [place] DE ADVERTENCIA DE Windy Canny DE ADVERTENCIA DE Benay Spice al registrarse en [address] de Emergencias y a la enfermera de triaje.  Si tiene preguntas despus de su visita o necesita cancelar o volver a programar su cita, por favor pngase en contacto con [facility] [place]  [phone]  y [place] instrucciones. Las horas de oficina son de 8:00 a.m. a 4:30 p.m. de lunes a viernes. Por favor, tenga en cuenta que los mensajes de voz que se dejan despus de las 4:00 p.m. posiblemente no se devolvern hasta el siguiente da de [place].  Cerramos los fines de semana y [employer]. En todo momento tiene acceso a una enfermera para preguntas urgentes. Por favor, llame al nmero principal de la clnica  [phone] y [place] instrucciones.   Para cualquier pregunta que no sea de carcter urgente, tambin puede ponerse en contacto con su proveedor [employer] Ahora ofrecemos visitas electrnicas para cualquier persona mayor de 18 aos que solicite atencin mdica en lnea para los sntomas que no sean urgentes. Para ms detalles vaya a [email or web address].   Tambin puede bajar la aplicacin de MyChart! Vaya a la tienda de aplicaciones, busque "MyChart", abra la aplicacin, seleccione  [place], e ingrese con su nombre de usuario y la contrasea de Pharmacist, community.

## 2022-12-12 NOTE — Progress Notes (Signed)
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  It showed a suspicious mass at the 5:30 position of the right breast 4 cm from the nipple measuring 1.8 x 1.6 x 1.2 cm.  There may be a thickened echogenic rim which would bring the measurement to 2.9 x 1.9 x 2.3 cm.  Overlying skin thickening.  No axillary adenopathy.  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      Patient completed neoadjuvant intent with keynote 522 regimen starting with CarboTaxol Keytruda chemotherapy which she completed in August 2023.  Patient then underwent right lumpectomy  with sentinel lymph node biopsy which showed a pathological complete response.  She is currently receiving adjuvant Keytruda.  She is also receiving adjuvant radiation treatment  Interval history- Patient was recently admitted to the hospital for bronchopneumonia treated with IV antibiotics.  Presently she is doing well although she has some mild residual cough.  Reports occasional trouble sleeping and melatonin is not working  ECOG PS- 1 Pain scale- 0   Review of systems- Review of Systems   Constitutional:  Negative for chills, fever, malaise/fatigue and weight loss.  HENT:  Negative for congestion, ear discharge and nosebleeds.   Eyes:  Negative for blurred vision.  Respiratory:  Positive for cough. Negative for hemoptysis, sputum production, shortness of breath and wheezing.   Cardiovascular:  Negative for chest pain, palpitations, orthopnea and claudication.  Gastrointestinal:  Negative for abdominal pain, blood in stool, constipation, diarrhea, heartburn, melena, nausea and vomiting.  Genitourinary:  Negative for dysuria, flank pain, frequency, hematuria and urgency.  Musculoskeletal:  Negative for back pain, joint pain and myalgias.  Skin:  Negative for rash.  Neurological:  Negative for dizziness, tingling, focal weakness, seizures, weakness and headaches.  Endo/Heme/Allergies:  Does not bruise/bleed easily.  Psychiatric/Behavioral:  Negative for depression and suicidal ideas. The patient has insomnia.       No Active Allergies   Past Medical History:  Diagnosis Date   Breast cancer, right breast ([place])    Diabetes mellitus without complication ([place])    Family history of prostate cancer    Gastritis    Heart murmur    History of chemotherapy    History of pancreatitis    Hyperlipidemia    Hypertension    Palpitations    Port-A-Cath in place    Vitamin B12 deficiency      Past Surgical History:  Procedure Laterality Date   BREAST BIOPSY Right 11/2015   benign   BREAST BIOPSY Right 01/24/2022   INVASIVE MAMMARY CARCINOMA, NO SPECIAL TYPE   BREAST CYST EXCISION Right    CESAREAN SECTION     2x   CHOLECYSTECTOMY  IR CV LINE INJECTION  03/28/2022   PARTIAL MASTECTOMY WITH AXILLARY SENTINEL LYMPH NODE BIOPSY Right 08/16/2022   Procedure: PARTIAL MASTECTOMY WITH AXILLARY SENTINEL LYMPH NODE BIOPSY -- RF guided;  Surgeon: Herbert Pun, MD;  Location: [facility] ORS;  Service: General;  Laterality: Right;   PORTACATH PLACEMENT N/A 03/01/2022    Procedure: INSERTION PORT-A-CATH;  Surgeon: Herbert Pun, MD;  Location: [facility] ORS;  Service: General;  Laterality: N/A;    Social History   Socioeconomic History   Marital status: Married    Spouse name: Not on file   Number of children: Not on file   Years of education: Not on file   Highest education level: Not on file  Occupational History   Not on file  Tobacco Use   Smoking status: Never   Smokeless tobacco: Never  Vaping Use   Vaping Use: Never used  Substance and Sexual Activity   Alcohol use: Never   Drug use: Never   Sexual activity: Not on file  Other Topics Concern   Not on file  Social History Narrative   Not on file   Social Determinants of Health   Financial Resource Strain: Low Risk  (02/24/2022)   Overall Financial Resource Strain (CARDIA)    Difficulty of Paying Living Expenses: Not very hard  Food Insecurity: No Food Insecurity (12/04/2022)   Hunger Vital Sign    Worried About Running Out of Food in the Last Year: Never true    Ran Out of Food in the Last Year: Never true  Transportation Needs: No Transportation Needs (12/04/2022)   PRAPARE - Hydrologist (Medical): No    Lack of Transportation (Non-Medical): No  Physical Activity: Inactive (02/24/2022)   Exercise Vital Sign    Days of Exercise per Week: 0 days    Minutes of Exercise per Session: 0 min  Stress: No Stress Concern Present (02/24/2022)   [place]    Feeling of Stress : Only a little  Social Connections: Moderately Integrated (02/24/2022)   Social Connection and Isolation Panel [NHANES]    Frequency of Communication with Friends and Family: Three times a week    Frequency of Social Gatherings with Friends and Family: Three times a week    Attends Religious Services: 1 to 4 times per year    Active Member of Clubs or Organizations: No    Attends Archivist Meetings: Never     Marital Status: Married  Human resources officer Violence: Not At Risk (12/04/2022)   Humiliation, Afraid, Rape, and Kick questionnaire    Fear of Current or Ex-Partner: No    Emotionally Abused: No    Physically Abused: No    Sexually Abused: No    Family History  Problem Relation Age of Onset   Heart disease Father    Breast cancer Sister        22s   Prostate cancer Paternal Grandfather    Stomach cancer Cousin      Current Outpatient Medications:    albuterol (PROVENTIL) (2.5 MG/3ML) 0.083% nebulizer solution, Take 2.5 mg by nebulization every 4 (four) hours as needed for wheezing or shortness of breath., Disp: , Rfl:    aspirin EC 81 MG tablet, Take 1 tablet (81 mg total) by mouth daily., Disp: 30 tablet, Rfl: 0   atorvastatin (LIPITOR) 80 MG tablet, Take 80 mg by mouth at bedtime., Disp: , Rfl:    benzonatate (TESSALON) 200 MG capsule,  Take 1 capsule (200 mg total) by mouth 3 (three) times daily as needed for cough., Disp: 20 capsule, Rfl: 0   dextromethorphan-guaiFENesin (MUCINEX DM) 30-600 MG 12hr tablet, Take 1 tablet by mouth 2 (two) times daily., Disp: 14 tablet, Rfl: 0   DULoxetine (CYMBALTA) 60 MG capsule, Take 60 mg by mouth at bedtime., Disp: , Rfl:    enalapril (VASOTEC) 20 MG tablet, Take 20 mg by mouth every morning., Disp: , Rfl:    fenofibrate (TRICOR) 145 MG tablet, Take 145 mg by mouth every morning., Disp: , Rfl:    fluticasone (FLONASE) 50 MCG/ACT nasal spray, Place 1-2 sprays into both nostrils daily as needed for allergies or rhinitis., Disp: , Rfl:    gabapentin (NEURONTIN) 300 MG capsule, Take 300 mg by mouth 3 (three) times daily., Disp: , Rfl:    glipiZIDE (GLUCOTROL XL) 5 MG 24 hr tablet, Take 5 mg by mouth daily with breakfast., Disp: , Rfl:    hydrochlorothiazide (MICROZIDE) 12.5 MG capsule, Take 12.5 mg by mouth daily., Disp: , Rfl:    Melatonin 5 MG CAPS, Take 5 mg by mouth at bedtime as needed (sleep)., Disp: , Rfl:    metoprolol succinate (TOPROL XL) 25 MG  24 hr tablet, Take 1 tablet (25 mg total) by mouth daily., Disp: 90 tablet, Rfl: 3   VENTOLIN HFA 108 (90 Base) MCG/ACT inhaler, Inhale 2 puffs by mouth into the lungs every 6 hours as needed for difficulty breathing., Disp: 8 g, Rfl: 1   vitamin C (ASCORBIC ACID) 500 MG tablet, Take 500 mg by mouth daily., Disp: , Rfl:   Physical exam:  Vitals:   12/12/22 0857  BP: 119/75  Pulse: 86  Temp: 99 F (37.2 C)  TempSrc: Tympanic  Weight: 120 lb 6.4 oz (54.6 kg)   Physical Exam Cardiovascular:     Rate and Rhythm: Normal rate and regular rhythm.     Heart sounds: Normal heart sounds.  Pulmonary:     Effort: Pulmonary effort is normal.     Breath sounds: Normal breath sounds.  Abdominal:     General: Bowel sounds are normal.     Palpations: Abdomen is soft.  Skin:    General: Skin is warm and dry.  Neurological:     Mental Status: She is alert and oriented to person, place, and time.         Latest Ref Rng & Units 12/04/2022    6:27 AM  CMP  Glucose 70 - 99 mg/dL 139   BUN 8 - 23 mg/dL 25   Creatinine 0.44 - 1.00 mg/dL 0.59   Sodium 135 - 145 mmol/L 137   Potassium 3.5 - 5.1 mmol/L 3.8   Chloride 98 - 111 mmol/L 99   CO2 22 - 32 mmol/L 29   Calcium 8.9 - 10.3 mg/dL 9.2       Latest Ref Rng & Units 12/12/2022    8:41 AM  CBC  WBC 4.0 - 10.5 K/uL 6.6   Hemoglobin 12.0 - 15.0 g/dL 11.7   Hematocrit 36.0 - 46.0 % 34.2   Platelets 150 - 400 K/uL 243     No images are attached to the encounter.  CT Angio Chest PE W and/or Wo Contrast  Result Date: 12/03/2022 CLINICAL DATA:  Right-sided breast cancer with partial right mastectomy 08/16/2022, presents with tachycardia, hypoxia and tachypnea. EXAM: CT ANGIOGRAPHY CHEST WITH CONTRAST TECHNIQUE: Multidetector CT imaging of the chest was performed using the standard protocol during bolus administration  of intravenous contrast. Multiplanar CT image reconstructions and MIPs were obtained to evaluate the vascular anatomy. RADIATION  DOSE REDUCTION: This exam was performed according to the departmental dose-optimization program which includes automated exposure control, adjustment of the mA and/or kV according to patient size and/or use of iterative reconstruction technique. CONTRAST:  49m OMNIPAQUE IOHEXOL 350 MG/ML SOLN COMPARISON:  Portable chest today, portable chest 11/30/2022 and 09/23/2022, and PET-CT 02/17/2022. FINDINGS: Cardiovascular: Left chest MediPort with IJ approach catheter terminating in the distal SVC. The cardiac size is normal. No coronary calcification or pericardial effusion is seen. There is mild aortic atherosclerosis. There is no aortic dissection, aneurysm or stenosis. The great vessels are clear. Pulmonary arteries are normal caliber without evidence of thromboemboli. Pulmonary veins are decompressed. Mediastinum/Nodes: Mild elevation right hemidiaphragm, unchanged. Small hiatal hernia. There is no intrathoracic or axillary adenopathy. There is increased thickening of the distal thoracic esophagus consistent with esophagitis. Thyroid gland and thoracic trachea are unremarkable. Lungs/Pleura: No pleural effusion, thickening or pneumothorax. There is diffuse increased bronchial thickening. There are scattered segmental and subsegmental bronchial impactions in the left upper and both lower lobes, likely mucoid impactions. There is asymmetric increased haziness in the posterior left lower lobe which could be early bronchopneumonia or related to air trapping. Additional geographic areas of mosaic attenuation in the upper and right lower lobes are most consistent with small airways disease with air trapping. No confluent airspace consolidation is seen. There is mild posterior atelectasis asymmetrically left lower lobe. There is new demonstration of a 6 mm subsolid ground-glass nodule in the right upper lobe apex on 5:33. No other nodules are seen. Upper Abdomen: No acute abnormality. No adrenal mass. Mild hepatic  steatosis. Musculoskeletal: There is increased skin thickening over the right breast. Differential diagnosis includes XRT, mastitis, and tumor invasion of dermal lymphatics. There are coarse interstitial changes in the inferior right breast where the hypermetabolic mass was previously noted, most likely due to postsurgical change. Follow-up as indicated. There is mild thoracic kyphosis and mild anterior wedging of multiple mid to lower thoracic spine vertebral bodies, with spondylosis and bridging enthesopathy. No destructive bone lesion is seen. Review of the MIP images confirms the above findings. IMPRESSION: 1. No evidence of arterial dilatation or embolus. 2. Increased bronchial thickening with scattered segmental and subsegmental bronchial impactions, likely mucoid impactions. 3. Asymmetric increased haziness in the posterior left lower lobe which could be early bronchopneumonia or related to air trapping. 4. Geographic areas of mosaic attenuation in the upper and right lower lobes most consistent with small airways disease with air trapping. 5. New 6 mm subsolid ground-glass nodule in the right upper lobe apex. Initial follow-up with CT at 6 months is recommended to confirm persistence. If persistent, repeat CT is recommended every 2 years until 5 years of stability has been established. This recommendation follows the consensus statement: Guidelines for Management of Incidental Pulmonary Nodules Detected on CT Images: From the Fleischner Society 2017; Radiology 2017; 284:228-243. 6. Increased skin thickening over the right breast. Differential diagnosis includes XRT, mastitis, and tumor invasion of dermal lymphatics. 7. Aortic atherosclerosis. 8. Small hiatal hernia with increased thickening of the distal thoracic esophagus consistent with esophagitis. 9. Hepatic steatosis. Aortic Atherosclerosis (ICD10-I70.0). Electronically Signed   By: KTelford NabM.D.   On: 12/03/2022 03:03   DG Chest 2  View  Result Date: 12/03/2022 CLINICAL DATA:  Chronic cough EXAM: CHEST - 2 VIEW COMPARISON:  09/23/2022 FINDINGS: Cardiac shadow is within normal limits. Left chest wall  port is noted in satisfactory position. Lungs are well aerated bilaterally. No focal infiltrate or effusion is seen. Degenerative changes of the thoracic spine are noted. Mild chronic interstitial changes are seen. IMPRESSION: No acute abnormality noted. Electronically Signed   By: Inez Catalina M.D.   On: 12/03/2022 01:03   DG Chest Portable 1 View  Result Date: 12/03/2022 CLINICAL DATA:  Shortness of breath EXAM: PORTABLE CHEST 1 VIEW COMPARISON:  11/30/2022 FINDINGS: Cardiac shadow is stable. Left chest wall port is again seen and stable. Lungs are well aerated bilaterally. No focal infiltrate or effusion is seen. No bony abnormality is noted. IMPRESSION: No acute abnormality noted. Electronically Signed   By: Inez Catalina M.D.   On: 12/03/2022 01:03     Assessment and plan- Patient is a 67 y.o. female with triple negative breast cancer stage IIb and cT2 N0 M0.  She is s/p neoadjuvant chemotherapy as per keynote 522 regimen.  She had right lumpectomy with sentinel lymph node biopsy which showed a pathological complete response.   She is here for on treatment assessment prior to cycle 5 of adjuvant Keytruda  Counts okay to proceed with cycle 5 of adjuvant Keytruda today.  I will see her back in 3 weeks for cycle 6.  Plan is to complete 9 adjuvant cycles.  Patient is undergoing adjuvant radiation treatment as well which she will complete on 12/05/2022  Insomnia: I will give her prescription for trazodone 50 mg nightly   Visit Diagnosis 1. Malignant neoplasm of lower-inner quadrant of right breast of female, estrogen receptor negative ([place])   2. Encounter for antineoplastic immunotherapy      Dr. Randa Evens, MD, MPH [facility] [phone] 12/12/2022 9:17 AM

## 2022-12-13 ENCOUNTER — Ambulatory Visit: Payer: Medicare HMO

## 2022-12-14 ENCOUNTER — Ambulatory Visit
Admission: RE | Admit: 2022-12-14 | Discharge: 2022-12-14 | Disposition: A | Discharge status: Home / Self Care | Payer: Medicare HMO | Source: Ambulatory Visit | Attending: Radiation Oncology | Admitting: Radiation Oncology

## 2022-12-14 ENCOUNTER — Other Ambulatory Visit: Payer: Self-pay

## 2022-12-14 DIAGNOSIS — Z5112 Encounter for antineoplastic immunotherapy: Secondary | ICD-10-CM | POA: Diagnosis not present

## 2022-12-14 LAB — RAD ONC ARIA SESSION SUMMARY
Course Elapsed Days: 7
Plan Fractions Treated to Date: 5
Plan Prescribed Dose Per Fraction: 2.66 Gy
Plan Total Fractions Prescribed: 16
Plan Total Prescribed Dose: 42.56 Gy
Reference Point Dosage Given to Date: 13.3 Gy
Reference Point Session Dosage Given: 2.66 Gy
Session Number: 5

## 2022-12-15 ENCOUNTER — Other Ambulatory Visit: Payer: Self-pay

## 2022-12-15 ENCOUNTER — Ambulatory Visit
Admission: RE | Admit: 2022-12-15 | Discharge: 2022-12-15 | Disposition: A | Discharge status: Home / Self Care | Payer: Medicare HMO | Source: Ambulatory Visit | Attending: Radiation Oncology | Admitting: Radiation Oncology

## 2022-12-15 DIAGNOSIS — Z5112 Encounter for antineoplastic immunotherapy: Secondary | ICD-10-CM | POA: Diagnosis not present

## 2022-12-15 LAB — RAD ONC ARIA SESSION SUMMARY
Course Elapsed Days: 8
Plan Fractions Treated to Date: 6
Plan Prescribed Dose Per Fraction: 2.66 Gy
Plan Total Fractions Prescribed: 16
Plan Total Prescribed Dose: 42.56 Gy
Reference Point Dosage Given to Date: 15.96 Gy
Reference Point Session Dosage Given: 2.66 Gy
Session Number: 6

## 2022-12-16 ENCOUNTER — Other Ambulatory Visit: Payer: Self-pay

## 2022-12-18 ENCOUNTER — Other Ambulatory Visit: Payer: Self-pay

## 2022-12-18 ENCOUNTER — Ambulatory Visit
Admission: RE | Admit: 2022-12-18 | Discharge: 2022-12-18 | Disposition: A | Discharge status: Home / Self Care | Payer: Medicare HMO | Source: Ambulatory Visit | Attending: Radiation Oncology | Admitting: Radiation Oncology

## 2022-12-18 DIAGNOSIS — Z5112 Encounter for antineoplastic immunotherapy: Secondary | ICD-10-CM | POA: Diagnosis not present

## 2022-12-18 LAB — RAD ONC ARIA SESSION SUMMARY
Course Elapsed Days: 11
Plan Fractions Treated to Date: 7
Plan Prescribed Dose Per Fraction: 2.66 Gy
Plan Total Fractions Prescribed: 16
Plan Total Prescribed Dose: 42.56 Gy
Reference Point Dosage Given to Date: 18.62 Gy
Reference Point Session Dosage Given: 2.66 Gy
Session Number: 7

## 2022-12-19 ENCOUNTER — Other Ambulatory Visit: Payer: Self-pay

## 2022-12-19 ENCOUNTER — Ambulatory Visit
Admission: RE | Admit: 2022-12-19 | Discharge: 2022-12-19 | Disposition: A | Discharge status: Home / Self Care | Payer: Medicare HMO | Source: Ambulatory Visit | Attending: Radiation Oncology | Admitting: Radiation Oncology

## 2022-12-19 DIAGNOSIS — Z5112 Encounter for antineoplastic immunotherapy: Secondary | ICD-10-CM | POA: Diagnosis not present

## 2022-12-19 LAB — RAD ONC ARIA SESSION SUMMARY
Course Elapsed Days: 12
Plan Fractions Treated to Date: 8
Plan Prescribed Dose Per Fraction: 2.66 Gy
Plan Total Fractions Prescribed: 16
Plan Total Prescribed Dose: 42.56 Gy
Reference Point Dosage Given to Date: 21.28 Gy
Reference Point Session Dosage Given: 2.66 Gy
Session Number: 8

## 2022-12-20 ENCOUNTER — Ambulatory Visit
Admission: RE | Admit: 2022-12-20 | Discharge: 2022-12-20 | Disposition: A | Discharge status: Home / Self Care | Payer: Medicare HMO | Source: Ambulatory Visit | Attending: Radiation Oncology | Admitting: Radiation Oncology

## 2022-12-20 ENCOUNTER — Other Ambulatory Visit: Payer: Self-pay

## 2022-12-20 DIAGNOSIS — Z5112 Encounter for antineoplastic immunotherapy: Secondary | ICD-10-CM | POA: Diagnosis not present

## 2022-12-20 LAB — RAD ONC ARIA SESSION SUMMARY
Course Elapsed Days: 13
Plan Fractions Treated to Date: 9
Plan Prescribed Dose Per Fraction: 2.66 Gy
Plan Total Fractions Prescribed: 16
Plan Total Prescribed Dose: 42.56 Gy
Reference Point Dosage Given to Date: 23.94 Gy
Reference Point Session Dosage Given: 2.66 Gy
Session Number: 9

## 2022-12-21 ENCOUNTER — Ambulatory Visit
Admission: RE | Admit: 2022-12-21 | Discharge: 2022-12-21 | Disposition: A | Discharge status: Home / Self Care | Payer: Medicare HMO | Source: Ambulatory Visit | Attending: Radiation Oncology | Admitting: Radiation Oncology

## 2022-12-21 ENCOUNTER — Other Ambulatory Visit: Payer: Self-pay

## 2022-12-21 DIAGNOSIS — Z5112 Encounter for antineoplastic immunotherapy: Secondary | ICD-10-CM | POA: Diagnosis not present

## 2022-12-21 LAB — RAD ONC ARIA SESSION SUMMARY
Course Elapsed Days: 14
Plan Fractions Treated to Date: 10
Plan Prescribed Dose Per Fraction: 2.66 Gy
Plan Total Fractions Prescribed: 16
Plan Total Prescribed Dose: 42.56 Gy
Reference Point Dosage Given to Date: 26.6 Gy
Reference Point Session Dosage Given: 2.66 Gy
Session Number: 10

## 2022-12-22 ENCOUNTER — Ambulatory Visit
Admission: RE | Admit: 2022-12-22 | Discharge: 2022-12-22 | Disposition: A | Discharge status: Home / Self Care | Payer: Medicare HMO | Source: Ambulatory Visit | Attending: Radiation Oncology | Admitting: Radiation Oncology

## 2022-12-22 ENCOUNTER — Other Ambulatory Visit: Payer: Self-pay

## 2022-12-22 DIAGNOSIS — Z5112 Encounter for antineoplastic immunotherapy: Secondary | ICD-10-CM | POA: Diagnosis not present

## 2022-12-22 LAB — RAD ONC ARIA SESSION SUMMARY
Course Elapsed Days: 15
Plan Fractions Treated to Date: 11
Plan Prescribed Dose Per Fraction: 2.66 Gy
Plan Total Fractions Prescribed: 16
Plan Total Prescribed Dose: 42.56 Gy
Reference Point Dosage Given to Date: 29.26 Gy
Reference Point Session Dosage Given: 2.66 Gy
Session Number: 11

## 2022-12-25 ENCOUNTER — Other Ambulatory Visit: Payer: Self-pay

## 2022-12-25 ENCOUNTER — Ambulatory Visit
Admission: RE | Admit: 2022-12-25 | Discharge: 2022-12-25 | Disposition: A | Discharge status: Home / Self Care | Payer: Medicare HMO | Source: Ambulatory Visit | Attending: Radiation Oncology | Admitting: Radiation Oncology

## 2022-12-25 ENCOUNTER — Encounter: Payer: Self-pay | Admitting: Oncology

## 2022-12-25 DIAGNOSIS — Z5112 Encounter for antineoplastic immunotherapy: Secondary | ICD-10-CM | POA: Diagnosis not present

## 2022-12-25 LAB — RAD ONC ARIA SESSION SUMMARY
Course Elapsed Days: 18
Plan Fractions Treated to Date: 12
Plan Prescribed Dose Per Fraction: 2.66 Gy
Plan Total Fractions Prescribed: 16
Plan Total Prescribed Dose: 42.56 Gy
Reference Point Dosage Given to Date: 31.92 Gy
Reference Point Session Dosage Given: 2.66 Gy
Session Number: 12

## 2022-12-26 ENCOUNTER — Ambulatory Visit
Admission: RE | Admit: 2022-12-26 | Discharge: 2022-12-26 | Disposition: A | Discharge status: Home / Self Care | Payer: Medicare HMO | Source: Ambulatory Visit | Attending: Radiation Oncology | Admitting: Radiation Oncology

## 2022-12-26 ENCOUNTER — Other Ambulatory Visit: Payer: Self-pay

## 2022-12-26 ENCOUNTER — Inpatient Hospital Stay: Payer: Medicare HMO

## 2022-12-26 DIAGNOSIS — Z5112 Encounter for antineoplastic immunotherapy: Secondary | ICD-10-CM | POA: Diagnosis not present

## 2022-12-26 LAB — RAD ONC ARIA SESSION SUMMARY
Course Elapsed Days: 19
Plan Fractions Treated to Date: 13
Plan Prescribed Dose Per Fraction: 2.66 Gy
Plan Total Fractions Prescribed: 16
Plan Total Prescribed Dose: 42.56 Gy
Reference Point Dosage Given to Date: 34.58 Gy
Reference Point Session Dosage Given: 2.66 Gy
Session Number: 13

## 2022-12-27 ENCOUNTER — Ambulatory Visit
Admission: RE | Admit: 2022-12-27 | Discharge: 2022-12-27 | Disposition: A | Discharge status: Home / Self Care | Payer: Medicare HMO | Source: Ambulatory Visit | Attending: Radiation Oncology | Admitting: Radiation Oncology

## 2022-12-27 ENCOUNTER — Other Ambulatory Visit: Payer: Self-pay

## 2022-12-27 DIAGNOSIS — Z5112 Encounter for antineoplastic immunotherapy: Secondary | ICD-10-CM | POA: Diagnosis not present

## 2022-12-27 LAB — RAD ONC ARIA SESSION SUMMARY
Course Elapsed Days: 20
Plan Fractions Treated to Date: 14
Plan Prescribed Dose Per Fraction: 2.66 Gy
Plan Total Fractions Prescribed: 16
Plan Total Prescribed Dose: 42.56 Gy
Reference Point Dosage Given to Date: 37.24 Gy
Reference Point Session Dosage Given: 2.66 Gy
Session Number: 14

## 2022-12-28 ENCOUNTER — Ambulatory Visit
Admission: RE | Admit: 2022-12-28 | Discharge: 2022-12-28 | Disposition: A | Discharge status: Home / Self Care | Payer: Medicare HMO | Source: Ambulatory Visit | Attending: Radiation Oncology | Admitting: Radiation Oncology

## 2022-12-28 ENCOUNTER — Other Ambulatory Visit: Payer: Self-pay

## 2022-12-28 DIAGNOSIS — Z5112 Encounter for antineoplastic immunotherapy: Secondary | ICD-10-CM | POA: Diagnosis not present

## 2022-12-28 LAB — RAD ONC ARIA SESSION SUMMARY
Course Elapsed Days: 21
Plan Fractions Treated to Date: 15
Plan Prescribed Dose Per Fraction: 2.66 Gy
Plan Total Fractions Prescribed: 16
Plan Total Prescribed Dose: 42.56 Gy
Reference Point Dosage Given to Date: 39.9 Gy
Reference Point Session Dosage Given: 2.66 Gy
Session Number: 15

## 2022-12-29 ENCOUNTER — Ambulatory Visit: Payer: Medicare HMO

## 2022-12-29 ENCOUNTER — Other Ambulatory Visit: Payer: Self-pay

## 2022-12-29 ENCOUNTER — Other Ambulatory Visit: Payer: Self-pay | Admitting: *Deleted

## 2022-12-29 MED ORDER — ALBUTEROL SULFATE HFA 108 (90 BASE) MCG/ACT IN AERS: INHALATION_SPRAY | RESPIRATORY_TRACT | 2 refills | Status: DC

## 2023-01-01 ENCOUNTER — Ambulatory Visit
Admission: RE | Admit: 2023-01-01 | Discharge: 2023-01-01 | Disposition: A | Discharge status: Home / Self Care | Payer: Medicare HMO | Source: Ambulatory Visit | Attending: Radiation Oncology | Admitting: Radiation Oncology

## 2023-01-01 ENCOUNTER — Ambulatory Visit: Payer: Medicare HMO

## 2023-01-01 ENCOUNTER — Other Ambulatory Visit: Payer: Self-pay

## 2023-01-01 DIAGNOSIS — Z5112 Encounter for antineoplastic immunotherapy: Secondary | ICD-10-CM | POA: Diagnosis not present

## 2023-01-01 LAB — RAD ONC ARIA SESSION SUMMARY
Course Elapsed Days: 25
Plan Fractions Treated to Date: 16
Plan Prescribed Dose Per Fraction: 2.66 Gy
Plan Total Fractions Prescribed: 16
Plan Total Prescribed Dose: 42.56 Gy
Reference Point Dosage Given to Date: 42.56 Gy
Reference Point Session Dosage Given: 2.66 Gy
Session Number: 16

## 2023-01-02 ENCOUNTER — Other Ambulatory Visit: Payer: Self-pay | Admitting: *Deleted

## 2023-01-02 ENCOUNTER — Inpatient Hospital Stay: Payer: Medicare HMO

## 2023-01-02 ENCOUNTER — Other Ambulatory Visit: Payer: Self-pay

## 2023-01-02 ENCOUNTER — Encounter: Payer: Self-pay | Admitting: Oncology

## 2023-01-02 ENCOUNTER — Inpatient Hospital Stay (HOSPITAL_BASED_OUTPATIENT_CLINIC_OR_DEPARTMENT_OTHER): Payer: Medicare HMO | Admitting: Oncology

## 2023-01-02 ENCOUNTER — Ambulatory Visit
Admission: RE | Admit: 2023-01-02 | Discharge: 2023-01-02 | Disposition: A | Discharge status: Home / Self Care | Payer: Medicare HMO | Source: Ambulatory Visit | Attending: Radiation Oncology | Admitting: Radiation Oncology

## 2023-01-02 VITALS — BP 134/69 | HR 78 | Temp 97.3°F | Ht 60.5 in | Wt 119.0 lb

## 2023-01-02 DIAGNOSIS — Z5112 Encounter for antineoplastic immunotherapy: Secondary | ICD-10-CM

## 2023-01-02 DIAGNOSIS — Z171 Estrogen receptor negative status [ER-]: Secondary | ICD-10-CM

## 2023-01-02 DIAGNOSIS — L589 Radiodermatitis, unspecified: Secondary | ICD-10-CM | POA: Diagnosis not present

## 2023-01-02 DIAGNOSIS — G4700 Insomnia, unspecified: Secondary | ICD-10-CM | POA: Diagnosis not present

## 2023-01-02 DIAGNOSIS — C50311 Malignant neoplasm of lower-inner quadrant of right female breast: Secondary | ICD-10-CM | POA: Diagnosis not present

## 2023-01-02 DIAGNOSIS — R911 Solitary pulmonary nodule: Secondary | ICD-10-CM

## 2023-01-02 LAB — CBC WITH DIFFERENTIAL/PLATELET
Abs Immature Granulocytes: 0.01 10*3/uL (ref 0.00–0.07)
Basophils Absolute: 0 10*3/uL (ref 0.0–0.1)
Basophils Relative: 1 %
Eosinophils Absolute: 0.6 10*3/uL — ABNORMAL HIGH (ref 0.0–0.5)
Eosinophils Relative: 11 %
HCT: 36.8 % (ref 36.0–46.0)
Hemoglobin: 12.5 g/dL (ref 12.0–15.0)
Immature Granulocytes: 0 %
Lymphocytes Relative: 11 %
Lymphs Abs: 0.6 10*3/uL — ABNORMAL LOW (ref 0.7–4.0)
MCH: 32.4 pg (ref 26.0–34.0)
MCHC: 34 g/dL (ref 30.0–36.0)
MCV: 95.3 fL (ref 80.0–100.0)
Monocytes Absolute: 0.6 10*3/uL (ref 0.1–1.0)
Monocytes Relative: 11 %
Neutro Abs: 3.5 10*3/uL (ref 1.7–7.7)
Neutrophils Relative %: 66 %
Platelets: 230 10*3/uL (ref 150–400)
RBC: 3.86 MIL/uL — ABNORMAL LOW (ref 3.87–5.11)
RDW: 13.1 % (ref 11.5–15.5)
WBC: 5.3 10*3/uL (ref 4.0–10.5)
nRBC: 0 % (ref 0.0–0.2)

## 2023-01-02 LAB — RAD ONC ARIA SESSION SUMMARY
Course Elapsed Days: 26
Plan Fractions Treated to Date: 1
Plan Prescribed Dose Per Fraction: 2 Gy
Plan Total Fractions Prescribed: 5
Plan Total Prescribed Dose: 10 Gy
Reference Point Dosage Given to Date: 2 Gy
Reference Point Session Dosage Given: 2 Gy
Session Number: 17

## 2023-01-02 LAB — COMPREHENSIVE METABOLIC PANEL
ALT: 24 U/L (ref 0–44)
AST: 30 U/L (ref 15–41)
Albumin: 4 g/dL (ref 3.5–5.0)
Alkaline Phosphatase: 51 U/L (ref 38–126)
Anion gap: 11 (ref 5–15)
BUN: 28 mg/dL — ABNORMAL HIGH (ref 8–23)
CO2: 26 mmol/L (ref 22–32)
Calcium: 8.9 mg/dL (ref 8.9–10.3)
Chloride: 96 mmol/L — ABNORMAL LOW (ref 98–111)
Creatinine, Ser: 0.91 mg/dL (ref 0.44–1.00)
GFR, Estimated: >60 mL/min (ref >60)
Glucose, Bld: 280 mg/dL — ABNORMAL HIGH (ref 70–99)
Potassium: 3.8 mmol/L (ref 3.5–5.1)
Sodium: 133 mmol/L — ABNORMAL LOW (ref 135–145)
Total Bilirubin: 0.5 mg/dL (ref 0.3–1.2)
Total Protein: 6.8 g/dL (ref 6.5–8.1)

## 2023-01-02 MED ORDER — SODIUM CHLORIDE 0.9 % IV SOLN
200.0000 mg | Freq: Once | INTRAVENOUS | Status: AC
  Administered 2023-01-02: 200 mg via INTRAVENOUS
  Filled 2023-01-02: qty 200

## 2023-01-02 MED ORDER — HEPARIN SOD (PORK) LOCK FLUSH 100 UNIT/ML IV SOLN
500.0000 [IU] | Freq: Once | INTRAVENOUS | Status: AC | PRN
  Administered 2023-01-02: 500 [IU]
  Filled 2023-01-02: qty 5

## 2023-01-02 MED ORDER — SODIUM CHLORIDE 0.9 % IV SOLN
Freq: Once | INTRAVENOUS | Status: AC
  Filled 2023-01-02: qty 250

## 2023-01-02 MED ORDER — ZOLPIDEM TARTRATE 5 MG PO TABS: 5.0000 mg | ORAL_TABLET | Freq: Every evening | ORAL | 0 refills | Status: DC | PRN

## 2023-01-02 NOTE — Progress Notes (Signed)
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  It showed a suspicious mass at the 5:30 position of the right breast 4 cm from the nipple measuring 1.8 x 1.6 x 1.2 cm.  There may be a thickened echogenic rim which would bring the measurement to 2.9 x 1.9 x 2.3 cm.  Overlying skin thickening.  No axillary adenopathy.  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      Patient completed neoadjuvant intent with keynote 522 regimen starting with CarboTaxol Keytruda chemotherapy which she completed in August 2023.  Patient then underwent right lumpectomy  with sentinel lymph node biopsy which showed a pathological complete response.  She is currently receiving adjuvant Keytruda.  She is also receiving adjuvant radiation treatment    Interval history-she is tolerating Keytruda well without any significant side effects.  She has mild redness over the skin of her right breast due to ongoing radiation.  She took 1 dose of trazodone for insomnia and 6-7 hours later developed acute abdominal pain and fecal incontinence.  Patient does not want to try trazodone anymore  for insomnia.  ECOG PS- 1 Pain scale- 0   Review of systems- Review of Systems  Constitutional:  Negative for chills, fever, malaise/fatigue and weight loss.  HENT:  Negative for congestion, ear discharge and nosebleeds.   Eyes:  Negative for blurred vision.  Respiratory:  Negative for cough, hemoptysis, sputum production, shortness of breath and wheezing.   Cardiovascular:  Negative for chest pain, palpitations, orthopnea and claudication.  Gastrointestinal:  Negative for abdominal pain, blood in stool, constipation, diarrhea, heartburn, melena, nausea and vomiting.  Genitourinary:  Negative for dysuria, flank pain, frequency, hematuria and urgency.  Musculoskeletal:  Negative for back pain, joint pain and myalgias.  Skin:  Negative for rash.  Neurological:  Negative for dizziness, tingling, focal weakness, seizures, weakness and headaches.  Endo/Heme/Allergies:  Does not bruise/bleed easily.  Psychiatric/Behavioral:  Negative for depression and suicidal ideas. The patient does not have insomnia.       No Active Allergies   Past Medical History:  Diagnosis Date   Breast cancer, right breast ([place])    Diabetes mellitus without complication ([place])    Family history of prostate cancer    Gastritis    Heart murmur    History of chemotherapy    History of pancreatitis    Hyperlipidemia    Hypertension    Palpitations    Port-A-Cath in place    Vitamin B12 deficiency      Past Surgical History:  Procedure Laterality Date   BREAST BIOPSY Right 11/2015   benign   BREAST BIOPSY Right 01/24/2022   INVASIVE MAMMARY CARCINOMA, NO SPECIAL TYPE  BREAST CYST EXCISION Right    CESAREAN SECTION     2x   CHOLECYSTECTOMY     IR CV LINE INJECTION  03/28/2022   PARTIAL MASTECTOMY WITH AXILLARY SENTINEL LYMPH NODE BIOPSY Right 08/16/2022   Procedure: PARTIAL MASTECTOMY WITH AXILLARY SENTINEL LYMPH NODE BIOPSY -- RF guided;  Surgeon: Herbert Pun, MD;  Location: [facility] ORS;   Service: General;  Laterality: Right;   PORTACATH PLACEMENT N/A 03/01/2022   Procedure: INSERTION PORT-A-CATH;  Surgeon: Herbert Pun, MD;  Location: [facility] ORS;  Service: General;  Laterality: N/A;    Social History   Socioeconomic History   Marital status: Married    Spouse name: Not on file   Number of children: Not on file   Years of education: Not on file   Highest education level: Not on file  Occupational History   Not on file  Tobacco Use   Smoking status: Never   Smokeless tobacco: Never  Vaping Use   Vaping Use: Never used  Substance and Sexual Activity   Alcohol use: Never   Drug use: Never   Sexual activity: Not on file  Other Topics Concern   Not on file  Social History Narrative   Not on file   Social Determinants of Health   Financial Resource Strain: Low Risk  (02/24/2022)   Overall Financial Resource Strain (CARDIA)    Difficulty of Paying Living Expenses: Not very hard  Food Insecurity: No Food Insecurity (12/04/2022)   Hunger Vital Sign    Worried About Running Out of Food in the Last Year: Never true    Ran Out of Food in the Last Year: Never true  Transportation Needs: No Transportation Needs (12/04/2022)   PRAPARE - Hydrologist (Medical): No    Lack of Transportation (Non-Medical): No  Physical Activity: Inactive (02/24/2022)   Exercise Vital Sign    Days of Exercise per Week: 0 days    Minutes of Exercise per Session: 0 min  Stress: No Stress Concern Present (02/24/2022)   [place]    Feeling of Stress : Only a little  Social Connections: Moderately Integrated (02/24/2022)   Social Connection and Isolation Panel [NHANES]    Frequency of Communication with Friends and Family: Three times a week    Frequency of Social Gatherings with Friends and Family: Three times a week    Attends Religious Services: 1 to 4 times per year    Active Member of Clubs  or Organizations: No    Attends Archivist Meetings: Never    Marital Status: Married  Human resources officer Violence: Not At Risk (12/04/2022)   Humiliation, Afraid, Rape, and Kick questionnaire    Fear of Current or Ex-Partner: No    Emotionally Abused: No    Physically Abused: No    Sexually Abused: No    Family History  Problem Relation Age of Onset   Heart disease Father    Breast cancer Sister        60s   Prostate cancer Paternal Grandfather    Stomach cancer Cousin      Current Outpatient Medications:    albuterol (PROVENTIL) (2.5 MG/3ML) 0.083% nebulizer solution, Take 2.5 mg by nebulization every 4 (four) hours as needed for wheezing or shortness of breath., Disp: , Rfl:    albuterol (VENTOLIN HFA) 108 (90 Base) MCG/ACT inhaler, Inhale 2 puffs by mouth into the lungs every 6 hours as needed for difficulty  breathing., Disp: 8 g, Rfl: 2   aspirin EC 81 MG tablet, Take 1 tablet (81 mg total) by mouth daily., Disp: 30 tablet, Rfl: 0   atorvastatin (LIPITOR) 80 MG tablet, Take 80 mg by mouth at bedtime., Disp: , Rfl:    DULoxetine (CYMBALTA) 60 MG capsule, Take 60 mg by mouth at bedtime., Disp: , Rfl:    enalapril (VASOTEC) 20 MG tablet, Take 20 mg by mouth every morning., Disp: , Rfl:    fenofibrate (TRICOR) 145 MG tablet, Take 145 mg by mouth every morning., Disp: , Rfl:    fluticasone (FLONASE) 50 MCG/ACT nasal spray, Place 1-2 sprays into both nostrils daily as needed for allergies or rhinitis., Disp: , Rfl:    gabapentin (NEURONTIN) 300 MG capsule, Take 300 mg by mouth 3 (three) times daily., Disp: , Rfl:    glipiZIDE (GLUCOTROL XL) 5 MG 24 hr tablet, Take 5 mg by mouth daily with breakfast., Disp: , Rfl:    hydrochlorothiazide (MICROZIDE) 12.5 MG capsule, Take 12.5 mg by mouth daily., Disp: , Rfl:    Melatonin 5 MG CAPS, Take 5 mg by mouth at bedtime as needed (sleep)., Disp: , Rfl:    metoprolol succinate (TOPROL XL) 25 MG 24 hr tablet, Take 1 tablet (25 mg total) by  mouth daily., Disp: 90 tablet, Rfl: 3   vitamin C (ASCORBIC ACID) 500 MG tablet, Take 500 mg by mouth daily., Disp: , Rfl:    benzonatate (TESSALON) 200 MG capsule, Take 1 capsule (200 mg total) by mouth 3 (three) times daily as needed for cough. (Patient not taking: Reported on 01/02/2023), Disp: 20 capsule, Rfl: 0   dextromethorphan-guaiFENesin (MUCINEX DM) 30-600 MG 12hr tablet, Take 1 tablet by mouth 2 (two) times daily. (Patient not taking: Reported on 01/02/2023), Disp: 14 tablet, Rfl: 0 No current facility-administered medications for this visit.  Facility-Administered Medications Ordered in Other Visits:    heparin lock flush 100 unit/mL, 500 Units, Intracatheter, Once PRN, Sindy Guadeloupe, MD   pembrolizumab [facility]) 200 mg in sodium chloride 0.9 % 50 mL chemo infusion, 200 mg, Intravenous, Once, Sindy Guadeloupe, MD  Physical exam:  Vitals:   01/02/23 0918  BP: 134/69  Pulse: 78  Temp: (!) 97.3 F (36.3 C)  TempSrc: Tympanic  Weight: 119 lb (54 kg)  Height: 5' 0.5" (1.537 m)   Physical Exam Cardiovascular:     Rate and Rhythm: Normal rate and regular rhythm.     Heart sounds: Normal heart sounds.  Pulmonary:     Effort: Pulmonary effort is normal.     Breath sounds: Normal breath sounds.  Skin:    General: Skin is warm and dry.  Neurological:     Mental Status: She is alert and oriented to person, place, and time.   Breast exam: No palpable masses in either breast.  There is mild erythema noted over the skin of the right breast from radiation dermatitis     Latest Ref Rng & Units 01/02/2023    8:51 AM  CMP  Glucose 70 - 99 mg/dL 280   BUN 8 - 23 mg/dL 28   Creatinine 0.44 - 1.00 mg/dL 0.91   Sodium 135 - 145 mmol/L 133   Potassium 3.5 - 5.1 mmol/L 3.8   Chloride 98 - 111 mmol/L 96   CO2 22 - 32 mmol/L 26   Calcium 8.9 - 10.3 mg/dL 8.9   Total Protein 6.5 - 8.1 g/dL 6.8   Total Bilirubin 0.3 - 1.2 mg/dL  0.5   Alkaline Phos 38 - 126 U/L 51   AST 15 - 41 U/L 30    ALT 0 - 44 U/L 24       Latest Ref Rng & Units 01/02/2023    8:51 AM  CBC  WBC 4.0 - 10.5 K/uL 5.3   Hemoglobin 12.0 - 15.0 g/dL 12.5   Hematocrit 36.0 - 46.0 % 36.8   Platelets 150 - 400 K/uL 230     No images are attached to the encounter.  No results found.   Assessment and plan- Patient is a 67 y.o. female  with triple negative breast cancer stage IIb and cT2 N0 M0.  She is s/p neoadjuvant chemotherapy as per keynote 522 regimen.  She had right lumpectomy with sentinel lymph node biopsy which showed a pathological complete response.  She is here for on treatment assessment prior to cycle 6 of adjuvant Keytruda  Counts ok to proceed with cycle 6 of adjuvant Keytruda today.  I will see her back in 3 weeks for cycle 7.  Plan is to complete 9 adjuvant cycles.  Patient is completing adjuvant radiation therapy next week.  She has mild radiation dermatitis but is otherwise doing well.  Insomnia: Could not tolerate trazodone.  I am starting her on Ambien but I explained to her clearly that I will not be able to give it to her long-term and she will need to try weaning herself off after 2 to 3 months or talk to her primary care doctor about continuing sleep medications.  Patient was found to have left lower lobe lung nodule 5 mm on her CT chest in December 2023 and I will follow up with a repeat CT chest in June 2024.    Visit Diagnosis 1. Malignant neoplasm of lower-inner quadrant of right breast of female, estrogen receptor negative ([place])   2. Encounter for antineoplastic immunotherapy   3. Radiation dermatitis   4. Insomnia, unspecified type      Dr. Randa Evens, MD, MPH [facility] [phone] 01/02/2023 10:10 AM

## 2023-01-02 NOTE — Patient Instructions (Signed)
Instrucciones al darle de alta: Discharge Instructions Gracias por elegir al [facility] [place] para brindarle atencin mdica de oncologa y hematologa.   Si usted tiene una cita de laboratorio con el [facility], por favor vaya directamente al [facility] y regstrese en el rea de registro.   Use ropa cmoda y adecuada para tener fcil acceso a las vas del Portacath (acceso venoso de larga duracin) o la lnea PICC (catter central colocado por va perifrica).   Nos esforzamos por ofrecerle tiempo de calidad con su proveedor. Es posible que tenga que volver a programar su cita si llega tarde (15 minutos o ms).  El llegar tarde le afecta a usted y a otros pacientes cuyas citas son posteriores a la suya.  Adems, si usted falta a tres o ms citas sin avisar a la oficina, puede ser retirado(a) de la clnica a discrecin del proveedor.      Para las solicitudes de renovacin de recetas, pida a su farmacia que se ponga en contacto con nuestra oficina y deje que transcurran 72 horas para que se complete el proceso de las renovaciones.    Para ayudar a prevenir las nuseas y los vmitos despus de su tratamiento, le recomendamos que tome su medicamento para las nuseas segn las indicaciones.  LOS SNTOMAS QUE DEBEN COMUNICARSE INMEDIATAMENTE SE INDICAN A CONTINUACIN: *FIEBRE SUPERIOR A 100.4 F (38 C) O MS *ESCALOFROS O SUDORACIN *NUSEAS Y VMITOS QUE NO SE CONTROLAN CON EL MEDICAMENTO PARA LAS NUSEAS *DIFICULTAD INUSUAL PARA RESPIRAR  *MORETONES O HEMORRAGIAS NO HABITUALES *PROBLEMAS URINARIOS (dolor o ardor al orinar o frecuencia para orinar) *PROBLEMAS INTESTINALES (diarrea inusual, estreimiento, dolor cerca del ano) SENSIBILIDAD EN LA BOCA Y EN LA GARGANTA CON O SIN LA PRESENCIA DE LCERAS (dolor de garganta, llagas en la boca o dolor de muelas/dientes) ERUPCIN, HINCHAZN O DOLORES INUSUALES FLUJO VAGINAL INUSUAL O PICAZN/RASQUIA    Los puntos marcados  con un asterisco ( *) indican una posible emergencia y debe hacer un seguimiento tan pronto como le sea posible o vaya al Departamento de Emergencias si se le presenta algn problema.  Por favor, muestre la TARJETA DE ADVERTENCIA DE QUIMIOTERAPIA O LA TARJETA DE ADVERTENCIA DE INMUNOTERAPIA al registrarse en el Departamento de Emergencias y a la enfermera de triaje.  Si tiene preguntas despus de su visita o necesita cancelar o volver a programar su cita, por favor pngase en contacto con [facility]  [phone]  y siga las instrucciones. Las horas de oficina son de 8:00 a.m. a 4:30 p.m. de lunes a viernes. Por favor, tenga en cuenta que los mensajes de voz que se dejan despus de las 4:00 p.m. posiblemente no se devolvern hasta el siguiente da de trabajo.  Cerramos los fines de semana y los das festivos importantes. En todo momento tiene acceso a una enfermera para preguntas urgentes. Por favor, llame al nmero principal de la clnica  [phone] y siga las instrucciones.   Para cualquier pregunta que no sea de carcter urgente, tambin puede ponerse en contacto con su proveedor utilizando MyChart. Ahora ofrecemos visitas electrnicas para cualquier persona mayor de 18 aos que solicite atencin mdica en lnea para los sntomas que no sean urgentes. Para ms detalles vaya a [email or web address].   Tambin puede bajar la aplicacin de MyChart! Vaya a la tienda de aplicaciones, busque "MyChart", abra la aplicacin, seleccione [place], e ingrese con su nombre de usuario y la contrasea de MyChart.  

## 2023-01-02 NOTE — Patient Instructions (Signed)

## 2023-01-03 ENCOUNTER — Ambulatory Visit
Admission: RE | Admit: 2023-01-03 | Discharge: 2023-01-03 | Disposition: A | Discharge status: Home / Self Care | Payer: Medicare HMO | Source: Ambulatory Visit | Attending: Radiation Oncology | Admitting: Radiation Oncology

## 2023-01-03 ENCOUNTER — Ambulatory Visit: Payer: Medicare HMO

## 2023-01-03 ENCOUNTER — Other Ambulatory Visit: Payer: Self-pay

## 2023-01-03 DIAGNOSIS — Z5112 Encounter for antineoplastic immunotherapy: Secondary | ICD-10-CM | POA: Diagnosis not present

## 2023-01-03 LAB — RAD ONC ARIA SESSION SUMMARY
Course Elapsed Days: 27
Plan Fractions Treated to Date: 2
Plan Prescribed Dose Per Fraction: 2 Gy
Plan Total Fractions Prescribed: 5
Plan Total Prescribed Dose: 10 Gy
Reference Point Dosage Given to Date: 4 Gy
Reference Point Session Dosage Given: 2 Gy
Session Number: 18

## 2023-01-04 ENCOUNTER — Ambulatory Visit
Admission: RE | Admit: 2023-01-04 | Discharge: 2023-01-04 | Disposition: A | Discharge status: Home / Self Care | Payer: Medicare HMO | Source: Ambulatory Visit | Attending: Radiation Oncology | Admitting: Radiation Oncology

## 2023-01-04 ENCOUNTER — Ambulatory Visit: Payer: Medicare HMO

## 2023-01-04 ENCOUNTER — Other Ambulatory Visit: Payer: Self-pay

## 2023-01-04 DIAGNOSIS — Z171 Estrogen receptor negative status [ER-]: Secondary | ICD-10-CM | POA: Insufficient documentation

## 2023-01-04 DIAGNOSIS — C50312 Malignant neoplasm of lower-inner quadrant of left female breast: Secondary | ICD-10-CM | POA: Diagnosis present

## 2023-01-04 LAB — RAD ONC ARIA SESSION SUMMARY
Course Elapsed Days: 28
Plan Fractions Treated to Date: 3
Plan Prescribed Dose Per Fraction: 2 Gy
Plan Total Fractions Prescribed: 5
Plan Total Prescribed Dose: 10 Gy
Reference Point Dosage Given to Date: 6 Gy
Reference Point Session Dosage Given: 2 Gy
Session Number: 19

## 2023-01-05 ENCOUNTER — Ambulatory Visit: Payer: Medicare HMO

## 2023-01-05 ENCOUNTER — Ambulatory Visit
Admission: RE | Admit: 2023-01-05 | Discharge: 2023-01-05 | Disposition: A | Discharge status: Home / Self Care | Payer: Medicare HMO | Source: Ambulatory Visit | Attending: Radiation Oncology | Admitting: Radiation Oncology

## 2023-01-05 ENCOUNTER — Other Ambulatory Visit: Payer: Self-pay

## 2023-01-05 DIAGNOSIS — C50312 Malignant neoplasm of lower-inner quadrant of left female breast: Secondary | ICD-10-CM | POA: Diagnosis not present

## 2023-01-05 LAB — RAD ONC ARIA SESSION SUMMARY
Course Elapsed Days: 29
Plan Fractions Treated to Date: 4
Plan Prescribed Dose Per Fraction: 2 Gy
Plan Total Fractions Prescribed: 5
Plan Total Prescribed Dose: 10 Gy
Reference Point Dosage Given to Date: 8 Gy
Reference Point Session Dosage Given: 2 Gy
Session Number: 20

## 2023-01-08 ENCOUNTER — Ambulatory Visit
Admission: RE | Admit: 2023-01-08 | Discharge: 2023-01-08 | Disposition: A | Discharge status: Home / Self Care | Payer: Medicare HMO | Source: Ambulatory Visit | Attending: Radiation Oncology | Admitting: Radiation Oncology

## 2023-01-08 ENCOUNTER — Other Ambulatory Visit: Payer: Self-pay

## 2023-01-08 DIAGNOSIS — C50312 Malignant neoplasm of lower-inner quadrant of left female breast: Secondary | ICD-10-CM | POA: Diagnosis not present

## 2023-01-08 LAB — RAD ONC ARIA SESSION SUMMARY
Course Elapsed Days: 32
Plan Fractions Treated to Date: 5
Plan Prescribed Dose Per Fraction: 2 Gy
Plan Total Fractions Prescribed: 5
Plan Total Prescribed Dose: 10 Gy
Reference Point Dosage Given to Date: 10 Gy
Reference Point Session Dosage Given: 2 Gy
Session Number: 21

## 2023-01-16 ENCOUNTER — Encounter: Payer: Self-pay | Admitting: Oncology

## 2023-01-22 ENCOUNTER — Other Ambulatory Visit: Payer: Self-pay | Admitting: Family Medicine

## 2023-01-22 DIAGNOSIS — R911 Solitary pulmonary nodule: Secondary | ICD-10-CM

## 2023-01-22 DIAGNOSIS — J180 Bronchopneumonia, unspecified organism: Secondary | ICD-10-CM

## 2023-01-23 ENCOUNTER — Ambulatory Visit
Admission: RE | Admit: 2023-01-23 | Discharge: 2023-01-23 | Disposition: A | Discharge status: Home / Self Care | Payer: Medicare HMO | Source: Ambulatory Visit | Attending: Family Medicine | Admitting: Family Medicine

## 2023-01-23 ENCOUNTER — Inpatient Hospital Stay: Payer: Medicare HMO

## 2023-01-23 ENCOUNTER — Encounter: Payer: Self-pay | Admitting: Oncology

## 2023-01-23 ENCOUNTER — Inpatient Hospital Stay: Payer: Medicare HMO | Attending: Oncology | Admitting: Oncology

## 2023-01-23 VITALS — BP 145/76 | HR 77 | Temp 97.0°F | Resp 18 | Ht 62.0 in | Wt 119.6 lb

## 2023-01-23 DIAGNOSIS — C50311 Malignant neoplasm of lower-inner quadrant of right female breast: Secondary | ICD-10-CM

## 2023-01-23 DIAGNOSIS — R911 Solitary pulmonary nodule: Secondary | ICD-10-CM | POA: Diagnosis not present

## 2023-01-23 DIAGNOSIS — Z5112 Encounter for antineoplastic immunotherapy: Secondary | ICD-10-CM | POA: Diagnosis present

## 2023-01-23 DIAGNOSIS — Z171 Estrogen receptor negative status [ER-]: Secondary | ICD-10-CM | POA: Insufficient documentation

## 2023-01-23 DIAGNOSIS — J180 Bronchopneumonia, unspecified organism: Secondary | ICD-10-CM | POA: Insufficient documentation

## 2023-01-23 DIAGNOSIS — Z79899 Other long term (current) drug therapy: Secondary | ICD-10-CM | POA: Diagnosis not present

## 2023-01-23 LAB — CBC WITH DIFFERENTIAL/PLATELET
Abs Immature Granulocytes: 0.03 10*3/uL (ref 0.00–0.07)
Basophils Absolute: 0 10*3/uL (ref 0.0–0.1)
Basophils Relative: 0 %
Eosinophils Absolute: 0.1 10*3/uL (ref 0.0–0.5)
Eosinophils Relative: 2 %
HCT: 36.1 % (ref 36.0–46.0)
Hemoglobin: 12.1 g/dL (ref 12.0–15.0)
Immature Granulocytes: 0 %
Lymphocytes Relative: 18 %
Lymphs Abs: 1.5 10*3/uL (ref 0.7–4.0)
MCH: 32 pg (ref 26.0–34.0)
MCHC: 33.5 g/dL (ref 30.0–36.0)
MCV: 95.5 fL (ref 80.0–100.0)
Monocytes Absolute: 0.8 10*3/uL (ref 0.1–1.0)
Monocytes Relative: 10 %
Neutro Abs: 5.8 10*3/uL (ref 1.7–7.7)
Neutrophils Relative %: 70 %
Platelets: 278 10*3/uL (ref 150–400)
RBC: 3.78 MIL/uL — ABNORMAL LOW (ref 3.87–5.11)
RDW: 12.8 % (ref 11.5–15.5)
WBC: 8.4 10*3/uL (ref 4.0–10.5)
nRBC: 0 % (ref 0.0–0.2)

## 2023-01-23 LAB — COMPREHENSIVE METABOLIC PANEL
ALT: 30 U/L (ref 0–44)
AST: 28 U/L (ref 15–41)
Albumin: 4.1 g/dL (ref 3.5–5.0)
Alkaline Phosphatase: 59 U/L (ref 38–126)
Anion gap: 10 (ref 5–15)
BUN: 29 mg/dL — ABNORMAL HIGH (ref 8–23)
CO2: 26 mmol/L (ref 22–32)
Calcium: 8.8 mg/dL — ABNORMAL LOW (ref 8.9–10.3)
Chloride: 99 mmol/L (ref 98–111)
Creatinine, Ser: 0.74 mg/dL (ref 0.44–1.00)
GFR, Estimated: >60 mL/min (ref >60)
Glucose, Bld: 132 mg/dL — ABNORMAL HIGH (ref 70–99)
Potassium: 3.4 mmol/L — ABNORMAL LOW (ref 3.5–5.1)
Sodium: 135 mmol/L (ref 135–145)
Total Bilirubin: 0.5 mg/dL (ref 0.3–1.2)
Total Protein: 7.1 g/dL (ref 6.5–8.1)

## 2023-01-23 LAB — TSH: TSH: 0.735 u[IU]/mL (ref 0.350–4.500)

## 2023-01-23 MED ORDER — HEPARIN SOD (PORK) LOCK FLUSH 100 UNIT/ML IV SOLN
500.0000 [IU] | Freq: Once | INTRAVENOUS | Status: AC | PRN
  Administered 2023-01-23: 500 [IU]
  Filled 2023-01-23: qty 5

## 2023-01-23 MED ORDER — SODIUM CHLORIDE 0.9 % IV SOLN
Freq: Once | INTRAVENOUS | Status: AC
  Filled 2023-01-23: qty 250

## 2023-01-23 MED ORDER — SODIUM CHLORIDE 0.9 % IV SOLN
200.0000 mg | Freq: Once | INTRAVENOUS | Status: AC
  Administered 2023-01-23: 200 mg via INTRAVENOUS
  Filled 2023-01-23: qty 200

## 2023-01-23 NOTE — Patient Instructions (Signed)
Instrucciones al darle de alta: Discharge Instructions Gracias por elegir al [facility] de Cncer de [place] para br[place]arle atencin mdica de oncologa y Music therapist.   Si usted tiene una cita de laboratorio con [place], por favor vaya directamente [place] y regstrese en el rea de Control and instrumentation engineer.   Use ropa cmoda y [place] para tener fcil acceso a las vas del Portacath (acceso venoso de Engineer, site duracin) o la lnea PICC (catter central colocado por va perifrica).   Nos esforzamos por ofrecerle tiempo de calidad con su proveedor. Es posible que tenga que volver a programar su cita si llega tarde (15 minutos o ms).  El llegar tarde le afecta a usted y a otros pacientes cuyas citas son posteriores a Merchandiser, retail.  Adems, si usted falta a tres o ms citas sin avisar a la oficina, puede ser retirado(a) de la clnica a discrecin del proveedor.      Para las solicitudes de renovacin de recetas, pida a su farmacia que se ponga en contacto con nuestra oficina y deje que transcurran 7 horas para que se complete el proceso de las renovaciones.    Hoy usted recibi los siguientes agentes de quimioterapia e/o inmunoterapia KEYTRUDA      Para ayudar a prevenir las nuseas y los vmitos despus de su tratamiento, le recomendamos que tome su medicamento para las nuseas segn las indicaciones.  LOS SNTOMAS QUE DEBEN COMUNICARSE INMEDIATAMENTE SE INDICAN A CONTINUACIN: *FIEBRE SUPERIOR A 100.4 F (38 C) O MS *ESCALOFROS O SUDORACIN *NUSEAS Y VMITOS QUE NO SE CONTROLAN CON EL MEDICAMENTO PARA LAS NUSEAS *DIFICULTAD INUSUAL PARA RESPIRAR  *MORETONES O HEMORRAGIAS NO HABITUALES *PROBLEMAS URINARIOS (dolor o ardor al Garment/textile technologist o frecuencia para Garment/textile technologist) *PROBLEMAS INTESTINALES (diarrea inusual, estreimiento, dolor cerca del ano) SENSIBILIDAD EN LA BOCA Y EN LA GARGANTA CON O SIN LA PRESENCIA DE LCERAS (dolor de garganta, llagas en la boca o dolor de muelas/dientes) ERUPCIN,  HINCHAZN O DOLORES INUSUALES FLUJO VAGINAL INUSUAL O PICAZN/RASQUIA    Los puntos marcados con un asterisco ( *) indican una posible emergencia y debe hacer un seguimiento tan pronto como le sea posible o vaya al Departamento de Emergencias si se le presenta algn problema.  Por favor, muestre la [place] DE ADVERTENCIA DE [place] DE ADVERTENCIA DE Benay Spice al registrarse en [address] de Emergencias y a la enfermera de triaje.  Si tiene preguntas despus de su visita o necesita cancelar o volver a programar su cita, por favor pngase en contacto con [place]  y [place] instrucciones. Las horas de oficina son de 8:00 a.m. a 4:30 p.m. de lunes a viernes. Por favor, tenga en cuenta que los mensajes de voz que se dejan despus de las 4:00 p.m. posiblemente no se devolvern hasta el siguiente da de [place].  Cerramos los fines de semana y [employer]. En todo momento tiene acceso a una enfermera para preguntas urgentes. Por favor, llame al nmero principal de la clnica  [phone] y [place] instrucciones.   Para cualquier pregunta que no sea de carcter urgente, tambin puede ponerse en contacto con su proveedor [employer] Ahora ofrecemos visitas electrnicas para cualquier persona mayor de 18 aos que solicite atencin mdica en lnea para los sntomas que no sean urgentes. Para ms detalles vaya a [email or web address].   Tambin puede bajar la aplicacin de MyChart! Vaya a la tienda de aplicaciones, busque "MyChart", abra la aplicacin,  seleccione , e ingrese con su nombre de usuario y la contrasea de Pharmacist, community.   Pembrolizumab Injection What is this medication? PEMBROLIZUMAB (PEM broe LIZ ue mab) treats some types of cancer. It works by helping your immune system slow or stop the spread of cancer cells. It is a monoclonal antibody. This medicine may be used for other  purposes; ask your health care provider or pharmacist if you have questions. COMMON BRAND NAME(S): Keytruda What should I tell my care team before I take this medication? They need to know if you have any of these conditions: Allogeneic stem cell transplant (uses someone else's stem cells) Autoimmune diseases, such as Crohn disease, ulcerative colitis, lupus History of chest radiation Nervous system problems, such as Guillain-Barre syndrome, myasthenia gravis Organ transplant An unusual or allergic reaction to pembrolizumab, other medications, foods, dyes, or preservatives Pregnant or trying to get pregnant Breast-feeding How should I use this medication? This medication is injected into a vein. It is given by your care team in a hospital or clinic setting. A special MedGuide will be given to you before each treatment. Be sure to read this information carefully each time. Talk to your care team about the use of this medication in children. While it may be prescribed for children as young as 6 months for selected conditions, precautions do apply. Overdosage: If you think you have taken too much of this medicine contact a poison control center or emergency room at once. NOTE: This medicine is only for you. Do not share this medicine with others. What if I miss a dose? Keep appointments for follow-up doses. It is important not to miss your dose. Call your care team if you are unable to keep an appointment. What may interact with this medication? Interactions have not been studied. This list may not describe all possible interactions. Give your health care provider a list of all the medicines, herbs, non-prescription drugs, or dietary supplements you use. Also tell them if you smoke, drink alcohol, or use illegal drugs. Some items may interact with your medicine. What should I watch for while using this medication? Your condition will be monitored carefully while you are receiving this  medication. You may need blood work while taking this medication. This medication may cause serious skin reactions. They can happen weeks to months after starting the medication. Contact your care team right away if you notice fevers or flu-like symptoms with a rash. The rash may be red or purple and then turn into blisters or peeling of the skin. You may also notice a red rash with swelling of the face, lips, or lymph nodes in your neck or under your arms. Tell your care team right away if you have any change in your eyesight. Talk to your care team if you may be pregnant. Serious birth defects can occur if you take this medication during pregnancy and for 4 months after the last dose. You will need a negative pregnancy test before starting this medication. Contraception is recommended while taking this medication and for 4 months after the last dose. Your care team can help you find the option that works for you. Do not breastfeed while taking this medication and for 4 months after the last dose. What side effects may I notice from receiving this medication? Side effects that you should report to your care team as soon as possible: Allergic reactions--skin rash, itching, hives, swelling of the face, lips, tongue, or throat Dry cough, shortness of breath or  trouble breathing Eye pain, redness, irritation, or discharge with blurry or decreased vision Heart muscle inflammation--unusual weakness or fatigue, shortness of breath, chest pain, fast or irregular heartbeat, dizziness, swelling of the ankles, feet, or hands Hormone gland problems--headache, sensitivity to light, unusual weakness or fatigue, dizziness, fast or irregular heartbeat, increased sensitivity to cold or heat, excessive sweating, constipation, hair loss, increased thirst or amount of urine, tremors or shaking, irritability Infusion reactions--chest pain, shortness of breath or trouble breathing, feeling faint or lightheaded Kidney injury  (glomerulonephritis)--decrease in the amount of urine, red or dark brown urine, foamy or bubbly urine, swelling of the ankles, hands, or feet Liver injury--right upper belly pain, loss of appetite, nausea, light-colored stool, dark yellow or brown urine, yellowing skin or eyes, unusual weakness or fatigue Pain, tingling, or numbness in the hands or feet, muscle weakness, change in vision, confusion or trouble speaking, loss of balance or coordination, trouble walking, seizures Rash, fever, and swollen lymph nodes Redness, blistering, peeling, or loosening of the skin, including inside the mouth Sudden or severe stomach pain, bloody diarrhea, fever, nausea, vomiting Side effects that usually do not require medical attention (report to your care team if they continue or are bothersome): Bone, joint, or muscle pain Diarrhea Fatigue Loss of appetite Nausea Skin rash This list may not describe all possible side effects. Call your doctor for medical advice about side effects. You may report side effects to FDA at 1-800-FDA-1088. Where should I keep my medication? This medication is given in a hospital or clinic. It will not be stored at home. NOTE: This sheet is a summary. It may not cover all possible information. If you have questions about this medicine, talk to your doctor, pharmacist, or health care provider.  2023 Elsevier/Gold Standard (2013-08-11 00:00:00)  Pembrolizumab Injection Qu es este medicamento? El PEMBROLIZUMAB es un anticuerpo monoclonal. [place] para tratar ciertos tipos de cncer. Este medicamento puede ser utilizado para otros usos; si tiene alguna pregunta consulte con su proveedor de atencin mdica o con su farmacutico. MARCAS COMUNES: Keytruda Qu le debo informar a mi profesional de la salud antes de tomar este medicamento? Necesitan saber si usted presenta alguno de los siguientes problemas o situaciones: enfermedades autoinmunes tales como enfermedad de Crohn,  colitis ulcerativa o lupus ha tenido o planea tener un trasplante alognico de Social research officer, government ([place] las clulas madre de Theatre manager persona) antecedentes de trasplante de rganos antecedentes de radiacin en el pecho problemas del sistema nervioso, tales como miastenia grave o sndrome de Curator una reaccin alrgica o inusual al pembrolizumab, a otros medicamentos, alimentos, colorantes o conservantes si est embarazada o buscando quedar embarazada si est amamantando a un beb Cmo debo utilizar este medicamento? Este medicamento se administra mediante infusin en una vena. Lo administra un profesional de Technical sales engineer en un hospital o en un entorno clnico. Se le entregar una Gu[place] del medicamento (MedGuide, su nombre en ingls) especial antes de cada tratamiento. Asegrese de leer esta informacin cada vez cuidadosamente. Hable con su pediatra para informarse acerca del uso de este medicamento en nios. Aunque este medicamento se puede recetar a nios tan pequeos como de 6 meses de edad con ciertas afecciones, existen precauciones que deben tomarse. Sobredosis: Pngase en contacto inmediatamente con un centro toxicolgico o una sala de urgencia si usted cree que haya tomado demasiado medicamento. ATENCIN: [employer] es solo para usted. No comparta este medicamento con nadie. Qu sucede si me olvido de una dosis? Es importante no olvidar ninguna dosis.  Informe a su mdico o a su profesional de la salud si no puede asistir a Photographer. Qu puede interactuar con este medicamento? No se han estudiado las interacciones. Puede ser que esta lista no menciona todas las posibles interacciones. Informe a su profesional de [employer]	[place] de [employer] productos a base de hierbas, medicamentos de [place] o suplementos nutritivos que est tomando. Si usted fuma, consume bebidas alcohlicas o si utiliza drogas ilegales, indqueselo tambin a su profesional de [employer]	[place]. Algunas sustancias pueden interactuar  con su medicamento. A qu debo estar atento al usar [employer]? Se supervisar su estado de salud atentamente mientras reciba este medicamento. Usted podra necesitar realizarse [employer] de sangre mientras est usando [place]. No debe quedar embarazada mientras est usando este medicamento o por 4 meses despus de dejar de usarlo. Las mujeres deben informar a su mdico si estn buscando quedar embarazadas o si creen que podran estar embarazadas. Existe la posibilidad de efectos secundarios graves en un beb sin nacer. Para obtener ms informacin, hable con su profesional de la salud o su farmacutico. No debe amamantar a un beb mientras est usando este medicamento o durante 4 meses despus de la ltima dosis. Qu efectos secundarios puedo tener al [employer] este medicamento? Efectos secundarios que debe informar a su mdico o a Barrister's clerk de la salud tan pronto como sea posible: Chief of Staff, tales como erupcin cutnea, comezn/picazn o urticaria, e hinchazn de la cara, los labios o la lengua con sangre o de color negro y aspecto alquitranado problemas para respirar cambios en la visin dolor en el pecho escalofros confusin estreimiento tos diarrea mareo, sensacin de desmayo o aturdimiento frecuencia cardiaca rpida o irregular fiebre enrojecimiento dolor en las articulaciones recuentos sanguneos bajos: este medicamento podra reducir la cantidad de glbulos blancos, glbulos rojos y plaquetas. Su riesgo de infeccin y sangrado podra ser mayor. dolor muscular debilidad muscular dolor, hormigueo o entumecimiento de las manos o los pies dolor de cabeza persistente enrojecimiento, formacin de ampollas, descamacin o distensin de la piel, incluso dentro de la boca signos y sntomas de niveles elevados de azcar en la sangre, tales como mareo; boca seca; piel seca; aliento frutal; nuseas; dolor de estmago; aumento del apetito o la sed; aumento de la  frecuencia urinaria signos y sntomas de lesin renal, tales como dificultad para Garment/textile technologist o cambios en la cantidad de orina signos y sntomas de lesin en el hgado, como orina oscura, heces claras, prdida del apetito, nuseas, dolor en la regin abdominal superior derecha, color amarillento de los ojos o la piel sudoracin ganglios linfticos inflamados prdida de peso Efectos secundarios que generalmente no requieren atencin mdica (infrmelos a su mdico o a su profesional de la salud si persisten o si son molestos): disminucin del apetito cada del cabello cansancio Puede ser que esta lista no menciona todos los posibles efectos secundarios. Comunquese a su mdico por asesoramiento mdico [employer] Usted puede informar los efectos secundarios a la FDA por telfono al 1-800-FDA-1088. Dnde debo guardar mi medicina? Este medicamento se administra en hospitales o clnicas, y no necesitar guardarlo en su domicilio. ATENCIN: Este folleto es un resumen. Puede ser que no cubra toda la posible informacin. Si usted tiene preguntas acerca de esta medicina, consulte con su mdico, su farmacutico o su profesional de Technical sales engineer.  2023 Elsevier/Gold Standard (2020-03-25 00:00:00)

## 2023-01-23 NOTE — Progress Notes (Signed)
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  It showed a suspicious mass at the 5:30 position of the right breast 4 cm from the nipple measuring 1.8 x 1.6 x 1.2 cm.  There may be a thickened echogenic rim which would bring the measurement to 2.9 x 1.9 x 2.3 cm.  Overlying skin thickening.  No axillary adenopathy.  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      Patient completed neoadjuvant intent with keynote 522 regimen starting with CarboTaxol Keytruda chemotherapy which she completed in August 2023.  Patient then underwent right lumpectomy  with sentinel lymph node biopsy which showed a pathological complete response.  She is currently receiving adjuvant Keytruda.  Patient has completed adjuvant radiation therapy.   Interval history-she has completed adjuvant radiation and tolerated it well so far.  She has some mild radiation dermatitis for which she is using Aquaphor cream.  Denies any side effects with Keytruda.  She does report some symptoms of mild congestion and cough for the last 1 week.  ECOG PS- 1 Pain scale- 0   Review of systems-  Review of Systems  Constitutional:  Negative for chills, fever, malaise/fatigue and weight loss.  HENT:  Negative for congestion, ear discharge and nosebleeds.   Eyes:  Negative for blurred vision.  Respiratory:  Negative for cough, hemoptysis, sputum production, shortness of breath and wheezing.   Cardiovascular:  Negative for chest pain, palpitations, orthopnea and claudication.  Gastrointestinal:  Negative for abdominal pain, blood in stool, constipation, diarrhea, heartburn, melena, nausea and vomiting.  Genitourinary:  Negative for dysuria, flank pain, frequency, hematuria and urgency.  Musculoskeletal:  Negative for back pain, joint pain and myalgias.  Skin:  Negative for rash.  Neurological:  Negative for dizziness, tingling, focal weakness, seizures, weakness and headaches.  Endo/Heme/Allergies:  Does not bruise/bleed easily.  Psychiatric/Behavioral:  Negative for depression and suicidal ideas. The patient does not have insomnia.       No Active Allergies   Past Medical History:  Diagnosis Date   Breast cancer, right breast ([place])    Diabetes mellitus without complication ([place])    Family history of prostate cancer    Gastritis    Heart murmur    History of chemotherapy    History of pancreatitis    Hyperlipidemia    Hypertension    Palpitations    Port-A-Cath in place    Vitamin B12 deficiency      Past Surgical History:  Procedure Laterality Date   BREAST BIOPSY Right 11/2015   benign   BREAST BIOPSY Right 01/24/2022   INVASIVE MAMMARY CARCINOMA, NO SPECIAL TYPE   BREAST CYST EXCISION Right    CESAREAN  SECTION     2x   CHOLECYSTECTOMY     IR CV LINE INJECTION  03/28/2022   PARTIAL MASTECTOMY WITH AXILLARY SENTINEL LYMPH NODE BIOPSY Right 08/16/2022   Procedure: PARTIAL MASTECTOMY WITH AXILLARY SENTINEL LYMPH NODE BIOPSY -- RF guided;  Surgeon: Herbert Pun, MD;  Location: [facility] ORS;  Service: General;  Laterality: Right;   PORTACATH PLACEMENT N/A  03/01/2022   Procedure: INSERTION PORT-A-CATH;  Surgeon: Herbert Pun, MD;  Location: [facility] ORS;  Service: General;  Laterality: N/A;    Social History   Socioeconomic History   Marital status: Married    Spouse name: Not on file   Number of children: Not on file   Years of education: Not on file   Highest education level: Not on file  Occupational History   Not on file  Tobacco Use   Smoking status: Never   Smokeless tobacco: Never  Vaping Use   Vaping Use: Never used  Substance and Sexual Activity   Alcohol use: Never   Drug use: Never   Sexual activity: Not on file  Other Topics Concern   Not on file  Social History Narrative   Not on file   Social Determinants of Health   Financial Resource Strain: Low Risk  (02/24/2022)   Overall Financial Resource Strain (CARDIA)    Difficulty of Paying Living Expenses: Not very hard  Food Insecurity: No Food Insecurity (12/04/2022)   Hunger Vital Sign    Worried About Running Out of Food in the Last Year: Never true    Ran Out of Food in the Last Year: Never true  Transportation Needs: No Transportation Needs (12/04/2022)   PRAPARE - Hydrologist (Medical): No    Lack of Transportation (Non-Medical): No  Physical Activity: Inactive (02/24/2022)   Exercise Vital Sign    Days of Exercise per Week: 0 days    Minutes of Exercise per Session: 0 min  Stress: No Stress Concern Present (02/24/2022)   [place]    Feeling of Stress : Only a little  Social Connections: Moderately Integrated (02/24/2022)   Social Connection and Isolation Panel [NHANES]    Frequency of Communication with Friends and Family: Three times a week    Frequency of Social Gatherings with Friends and Family: Three times a week    Attends Religious Services: 1 to 4 times per year    Active Member of Clubs or Organizations: No    Attends Archivist Meetings:  Never    Marital Status: Married  Human resources officer Violence: Not At Risk (12/04/2022)   Humiliation, Afraid, Rape, and Kick questionnaire    Fear of Current or Ex-Partner: No    Emotionally Abused: No    Physically Abused: No    Sexually Abused: No    Family History  Problem Relation Age of Onset   Heart disease Father    Breast cancer Sister        36s   Prostate cancer Paternal Grandfather    Stomach cancer Cousin      Current Outpatient Medications:    albuterol (PROVENTIL) (2.5 MG/3ML) 0.083% nebulizer solution, Take 2.5 mg by nebulization every 4 (four) hours as needed for wheezing or shortness of breath., Disp: , Rfl:    albuterol (VENTOLIN HFA) 108 (90 Base) MCG/ACT inhaler, Inhale 2 puffs by mouth into the lungs every 6 hours as needed for difficulty breathing., Disp: 8 g, Rfl: 2  aspirin EC 81 MG tablet, Take 1 tablet (81 mg total) by mouth daily., Disp: 30 tablet, Rfl: 0   atorvastatin (LIPITOR) 80 MG tablet, Take 80 mg by mouth at bedtime., Disp: , Rfl:    benzonatate (TESSALON) 200 MG capsule, Take 1 capsule (200 mg total) by mouth 3 (three) times daily as needed for cough., Disp: 20 capsule, Rfl: 0   dextromethorphan-guaiFENesin (MUCINEX DM) 30-600 MG 12hr tablet, Take 1 tablet by mouth 2 (two) times daily., Disp: 14 tablet, Rfl: 0   DULoxetine (CYMBALTA) 60 MG capsule, Take 60 mg by mouth at bedtime., Disp: , Rfl:    enalapril (VASOTEC) 20 MG tablet, Take 20 mg by mouth every morning., Disp: , Rfl:    fenofibrate (TRICOR) 145 MG tablet, Take 145 mg by mouth every morning., Disp: , Rfl:    fluticasone (FLONASE) 50 MCG/ACT nasal spray, Place 1-2 sprays into both nostrils daily as needed for allergies or rhinitis., Disp: , Rfl:    gabapentin (NEURONTIN) 300 MG capsule, Take 300 mg by mouth 3 (three) times daily., Disp: , Rfl:    glipiZIDE (GLUCOTROL XL) 5 MG 24 hr tablet, Take 5 mg by mouth daily with breakfast., Disp: , Rfl:    hydrochlorothiazide (MICROZIDE) 12.5 MG  capsule, Take 12.5 mg by mouth daily., Disp: , Rfl:    Melatonin 5 MG CAPS, Take 5 mg by mouth at bedtime as needed (sleep)., Disp: , Rfl:    meloxicam (MOBIC) 15 MG tablet, Take 15 mg by mouth daily., Disp: , Rfl:    metoprolol succinate (TOPROL XL) 25 MG 24 hr tablet, Take 1 tablet (25 mg total) by mouth daily., Disp: 90 tablet, Rfl: 3   TRUE METRIX BLOOD GLUCOSE TEST test strip, , Disp: , Rfl:    vitamin C (ASCORBIC ACID) 500 MG tablet, Take 500 mg by mouth daily., Disp: , Rfl:    zolpidem (AMBIEN) 5 MG tablet, Take 1 tablet (5 mg total) by mouth at bedtime as needed for sleep., Disp: 30 tablet, Rfl: 0  Physical exam:  Vitals:   01/23/23 0938  BP: (!) 145/76  Pulse: 77  Resp: 18  Temp: (!) 97 F (36.1 C)  TempSrc: Tympanic  SpO2: 98%  Weight: 119 lb 9.6 oz (54.3 kg)  Height: 5' 2"$  (1.575 m)   Physical Exam Cardiovascular:     Rate and Rhythm: Normal rate and regular rhythm.     Heart sounds: Normal heart sounds.  Pulmonary:     Effort: Pulmonary effort is normal.     Breath sounds: Normal breath sounds.  Skin:    General: Skin is warm and dry.  Neurological:     Mental Status: She is alert and oriented to person, place, and time.         Latest Ref Rng & Units 01/23/2023    9:12 AM  CMP  Glucose 70 - 99 mg/dL 132   BUN 8 - 23 mg/dL 29   Creatinine 0.44 - 1.00 mg/dL 0.74   Sodium 135 - 145 mmol/L 135   Potassium 3.5 - 5.1 mmol/L 3.4   Chloride 98 - 111 mmol/L 99   CO2 22 - 32 mmol/L 26   Calcium 8.9 - 10.3 mg/dL 8.8   Total Protein 6.5 - 8.1 g/dL 7.1   Total Bilirubin 0.3 - 1.2 mg/dL 0.5   Alkaline Phos 38 - 126 U/L 59   AST 15 - 41 U/L 28   ALT 0 - 44 U/L 30  Latest Ref Rng & Units 01/23/2023    9:12 AM  CBC  WBC 4.0 - 10.5 K/uL 8.4   Hemoglobin 12.0 - 15.0 g/dL 12.1   Hematocrit 36.0 - 46.0 % 36.1   Platelets 150 - 400 K/uL 278      Assessment and plan- Patient is a 68 y.o. female with triple negative breast cancer stage IIb and cT2 N0 M0.  She  is s/p neoadjuvant chemotherapy as per keynote 522 regimen.  She had right lumpectomy with sentinel lymph node biopsy which showed a pathological complete response.   She is here for on treatment assessment prior to cycle 7 of adjuvant Keytruda  Counts okay to proceed with cycle 7 of adjuvant Keytruda today.  She will be seen by covering NP in 3 weeks for cycle 8 and I will see her back in 6 weeks for cycle 9 which will be her last cycle.  I will reach out to Dr. Peyton Najjar and have her port taken out after she completes her treatments.  I will arrange for CT chest without contrast to be done in June 2024 for follow-up of her lung nodules.   Visit Diagnosis 1. Encounter for antineoplastic immunotherapy   2. Malignant neoplasm of lower-inner quadrant of right breast of female, estrogen receptor negative ([place])      Dr. Randa Evens, MD, MPH [facility] [id number] 01/23/2023 12:59 PM

## 2023-01-23 NOTE — Progress Notes (Signed)
No concerns for the provider today.

## 2023-02-05 ENCOUNTER — Other Ambulatory Visit: Payer: Self-pay | Admitting: Oncology

## 2023-02-07 ENCOUNTER — Ambulatory Visit
Admission: RE | Admit: 2023-02-07 | Discharge: 2023-02-07 | Disposition: A | Discharge status: Home / Self Care | Payer: Medicare HMO | Source: Ambulatory Visit | Attending: Radiation Oncology | Admitting: Radiation Oncology

## 2023-02-07 ENCOUNTER — Encounter: Payer: Self-pay | Admitting: Radiation Oncology

## 2023-02-07 VITALS — BP 136/68 | HR 78 | Temp 97.7°F | Resp 16 | Ht 61.0 in | Wt 121.0 lb

## 2023-02-07 DIAGNOSIS — Z171 Estrogen receptor negative status [ER-]: Secondary | ICD-10-CM | POA: Insufficient documentation

## 2023-02-07 DIAGNOSIS — C50311 Malignant neoplasm of lower-inner quadrant of right female breast: Secondary | ICD-10-CM | POA: Diagnosis present

## 2023-02-07 NOTE — Progress Notes (Signed)
Radiation Oncology Follow up Note  Name: Susan [name]   Date:   02/07/2023 MRN:  [id number] DOB: [birth year]    This 67 y.o. female presents to the clinic today for 1 month follow-up status post whole breast radiation for stage IIa (cT2 N0 M0) triple negative invasive mammary carcinoma the right breast.  REFERRING PROVIDER: Frazier Richards, MD  HPI: Patient is a 66 year old female accompanied by translator now 1 month out from whole breast radiation to her right breast for triple negative stage IIa invasive mammary carcinoma.  Seen today in routine follow-up she is doing well she has some throbbing in her breast although she is wearing it bra that is too small for her and I have encouraged to change bra size or discontinue wearing the bra for now.  She otherwise specifically denies cough or bone pain.  She is not on endocrine therapy based on the triple negative nature of her disease..  COMPLICATIONS OF TREATMENT: none  FOLLOW UP COMPLIANCE: keeps appointments   PHYSICAL EXAM:  BP 136/68 (BP Location: Right Arm, Patient Position: Sitting, Cuff Size: Normal)   Pulse 78   Temp 97.7 F (36.5 C)   Resp 16   Ht '5\' 1"'$  (1.549 m) Comment: Stated HT  Wt 121 lb (54.9 kg)   BMI 22.86 kg/m  Lungs are clear to A&P cardiac examination essentially unremarkable with regular rate and rhythm. No dominant mass or nodularity is noted in either breast in 2 positions examined. Incision is well-healed. No axillary or supraclavicular adenopathy is appreciated. Cosmetic result is excellent.  Well-developed well-nourished patient in NAD. HEENT reveals PERLA, EOMI, discs not visualized.  Oral cavity is clear. No oral mucosal lesions are identified. Neck is clear without evidence of cervical or supraclavicular adenopathy. Lungs are clear to A&P. Cardiac examination is essentially unremarkable with regular rate and rhythm without murmur rub or thrill. Abdomen is benign with no organomegaly or masses noted. Motor  sensory and DTR levels are equal and symmetric in the upper and lower extremities. Cranial nerves II through XII are grossly intact. Proprioception is intact. No peripheral adenopathy or edema is identified. No motor or sensory levels are noted. Crude visual fields are within normal range.  RADIOLOGY RESULTS: No current films for review  PLAN: At the present time patient is recovering well from whole breast radiation and surgery.  I have assured her the throbbing will improve should she change her bra size since it is causing significant contraction around the breast.  Otherwise I am pleased with her overall progress have asked to see her back in 6 months for follow-up.  Patient is to call with any concerns.  I would like to take this opportunity to thank you for allowing me to participate in the care of your patient.Noreene Filbert, MD

## 2023-02-13 ENCOUNTER — Inpatient Hospital Stay: Payer: Medicare HMO

## 2023-02-13 ENCOUNTER — Inpatient Hospital Stay (HOSPITAL_BASED_OUTPATIENT_CLINIC_OR_DEPARTMENT_OTHER): Payer: Medicare HMO | Admitting: Nurse Practitioner

## 2023-02-13 ENCOUNTER — Encounter: Payer: Self-pay | Admitting: Nurse Practitioner

## 2023-02-13 ENCOUNTER — Inpatient Hospital Stay: Payer: Medicare HMO | Attending: Oncology

## 2023-02-13 VITALS — BP 151/79 | HR 81 | Temp 98.2°F | Wt 120.9 lb

## 2023-02-13 DIAGNOSIS — Z5112 Encounter for antineoplastic immunotherapy: Secondary | ICD-10-CM

## 2023-02-13 DIAGNOSIS — Z171 Estrogen receptor negative status [ER-]: Secondary | ICD-10-CM | POA: Diagnosis not present

## 2023-02-13 DIAGNOSIS — J302 Other seasonal allergic rhinitis: Secondary | ICD-10-CM

## 2023-02-13 DIAGNOSIS — K5900 Constipation, unspecified: Secondary | ICD-10-CM | POA: Diagnosis not present

## 2023-02-13 DIAGNOSIS — C50311 Malignant neoplasm of lower-inner quadrant of right female breast: Secondary | ICD-10-CM | POA: Insufficient documentation

## 2023-02-13 DIAGNOSIS — Z79620 Long term (current) use of immunosuppressive biologic: Secondary | ICD-10-CM | POA: Insufficient documentation

## 2023-02-13 LAB — CBC WITH DIFFERENTIAL/PLATELET
Abs Immature Granulocytes: 0.01 10*3/uL (ref 0.00–0.07)
Basophils Absolute: 0 10*3/uL (ref 0.0–0.1)
Basophils Relative: 1 %
Eosinophils Absolute: 0.8 10*3/uL — ABNORMAL HIGH (ref 0.0–0.5)
Eosinophils Relative: 15 %
HCT: 34.8 % — ABNORMAL LOW (ref 36.0–46.0)
Hemoglobin: 11.9 g/dL — ABNORMAL LOW (ref 12.0–15.0)
Immature Granulocytes: 0 %
Lymphocytes Relative: 17 %
Lymphs Abs: 0.9 10*3/uL (ref 0.7–4.0)
MCH: 32.7 pg (ref 26.0–34.0)
MCHC: 34.2 g/dL (ref 30.0–36.0)
MCV: 95.6 fL (ref 80.0–100.0)
Monocytes Absolute: 0.5 10*3/uL (ref 0.1–1.0)
Monocytes Relative: 10 %
Neutro Abs: 3 10*3/uL (ref 1.7–7.7)
Neutrophils Relative %: 57 %
Platelets: 254 10*3/uL (ref 150–400)
RBC: 3.64 MIL/uL — ABNORMAL LOW (ref 3.87–5.11)
RDW: 12.5 % (ref 11.5–15.5)
WBC: 5.3 10*3/uL (ref 4.0–10.5)
nRBC: 0 % (ref 0.0–0.2)

## 2023-02-13 LAB — COMPREHENSIVE METABOLIC PANEL
ALT: 27 U/L (ref 0–44)
AST: 27 U/L (ref 15–41)
Albumin: 4.3 g/dL (ref 3.5–5.0)
Alkaline Phosphatase: 57 U/L (ref 38–126)
Anion gap: 9 (ref 5–15)
BUN: 25 mg/dL — ABNORMAL HIGH (ref 8–23)
CO2: 26 mmol/L (ref 22–32)
Calcium: 9.2 mg/dL (ref 8.9–10.3)
Chloride: 99 mmol/L (ref 98–111)
Creatinine, Ser: 0.72 mg/dL (ref 0.44–1.00)
GFR, Estimated: >60 mL/min (ref >60)
Glucose, Bld: 129 mg/dL — ABNORMAL HIGH (ref 70–99)
Potassium: 3.5 mmol/L (ref 3.5–5.1)
Sodium: 134 mmol/L — ABNORMAL LOW (ref 135–145)
Total Bilirubin: 0.5 mg/dL (ref 0.3–1.2)
Total Protein: 7.2 g/dL (ref 6.5–8.1)

## 2023-02-13 MED ORDER — HEPARIN SOD (PORK) LOCK FLUSH 100 UNIT/ML IV SOLN
INTRAVENOUS | Status: AC
  Administered 2023-02-13: 500 [IU]
  Filled 2023-02-13: qty 5

## 2023-02-13 MED ORDER — SODIUM CHLORIDE 0.9 % IV SOLN
200.0000 mg | Freq: Once | INTRAVENOUS | Status: AC
  Administered 2023-02-13: 200 mg via INTRAVENOUS
  Filled 2023-02-13: qty 8

## 2023-02-13 MED ORDER — HEPARIN SOD (PORK) LOCK FLUSH 100 UNIT/ML IV SOLN
500.0000 [IU] | Freq: Once | INTRAVENOUS | Status: AC | PRN
  Filled 2023-02-13: qty 5

## 2023-02-13 MED ORDER — SODIUM CHLORIDE 0.9 % IV SOLN
Freq: Once | INTRAVENOUS | Status: AC
  Filled 2023-02-13: qty 250

## 2023-02-13 NOTE — Patient Instructions (Signed)
Instrucciones al darle de alta: Discharge Instructions Gracias por elegir al [facility] de Cncer de [place] para brindarle atencin mdica de oncologa y Music therapist.   Si usted tiene una cita de laboratorio con [place], por favor vaya directamente [place] y regstrese en el rea de Control and instrumentation engineer.   Use ropa cmoda y [place] para tener fcil acceso a las vas del Portacath (acceso venoso de Engineer, site duracin) o la lnea PICC (catter central colocado por va perifrica).   Nos esforzamos por ofrecerle tiempo de calidad con su proveedor. Es posible que tenga que volver a programar su cita si llega tarde (15 minutos o ms).  El llegar tarde le afecta a usted y a otros pacientes cuyas citas son posteriores a Merchandiser, retail.  Adems, si usted falta a tres o ms citas sin avisar a la oficina, puede ser retirado(a) de la clnica a discrecin del proveedor.      Para las solicitudes de renovacin de recetas, pida a su farmacia que se ponga en contacto con nuestra oficina y deje que transcurran 22 horas para que se complete el proceso de las renovaciones.    Hoy usted recibi los siguientes agentes de quimioterapia e/o inmunoterapia Keytruda       Para ayudar a prevenir las nuseas y los vmitos despus de su tratamiento, le recomendamos que tome su medicamento para las nuseas segn las indicaciones.  LOS SNTOMAS QUE DEBEN COMUNICARSE INMEDIATAMENTE SE INDICAN A CONTINUACIN: *FIEBRE SUPERIOR A 100.4 F (38 C) O MS *ESCALOFROS O SUDORACIN *NUSEAS Y VMITOS QUE NO SE CONTROLAN CON EL MEDICAMENTO PARA LAS NUSEAS *DIFICULTAD INUSUAL PARA RESPIRAR  *MORETONES O HEMORRAGIAS NO HABITUALES *PROBLEMAS URINARIOS (dolor o ardor al Garment/textile technologist o frecuencia para Garment/textile technologist) *PROBLEMAS INTESTINALES (diarrea inusual, estreimiento, dolor cerca del ano) SENSIBILIDAD EN LA BOCA Y EN LA GARGANTA CON O SIN LA PRESENCIA DE LCERAS (dolor de garganta, llagas en la boca o dolor de muelas/dientes) ERUPCIN,  HINCHAZN O DOLORES INUSUALES FLUJO VAGINAL INUSUAL O PICAZN/RASQUIA    Los puntos marcados con un asterisco ( *) indican una posible emergencia y debe hacer un seguimiento tan pronto como le sea posible o vaya al Departamento de Emergencias si se le presenta algn problema.  Por favor, muestre la [place] DE ADVERTENCIA DE Windy Canny DE ADVERTENCIA DE Benay Spice al registrarse en [address] de Emergencias y a la enfermera de triaje.  Si tiene preguntas despus de su visita o necesita cancelar o volver a programar su cita, por favor pngase en contacto con [place]  y [place] instrucciones. Las horas de oficina son de 8:00 a.m. a 4:30 p.m. de lunes a viernes. Por favor, tenga en cuenta que los mensajes de voz que se dejan despus de las 4:00 p.m. posiblemente no se devolvern hasta el siguiente da de [place].  Cerramos los fines de semana y [employer]. En todo momento tiene acceso a una enfermera para preguntas urgentes. Por favor, llame al nmero principal de la clnica  [phone] y [place] instrucciones.   Para cualquier pregunta que no sea de carcter urgente, tambin puede ponerse en contacto con su proveedor [employer] Ahora ofrecemos visitas electrnicas para cualquier persona mayor de 18 aos que solicite atencin mdica en lnea para los sntomas que no sean urgentes. Para ms detalles vaya a [email or web address].   Tambin puede bajar la aplicacin de MyChart! Vaya a la tienda de aplicaciones, busque "MyChart", abra la  aplicacin, seleccione [place], e ingrese con su nombre de usuario y la contrasea de Pharmacist, community.

## 2023-02-13 NOTE — Progress Notes (Signed)
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  It showed a suspicious mass at the 5:30 position of the right breast 4 cm from the nipple measuring 1.8 x 1.6 x 1.2 cm.  There may be a thickened echogenic rim which would bring the measurement to 2.9 x 1.9 x 2.3 cm.  Overlying skin thickening.  No axillary adenopathy.  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    Patient completed neoadjuvant intent with keynote 522 regimen starting with CarboTaxol Keytruda chemotherapy which she completed in August 2023.  Patient then underwent right lumpectomy  with sentinel lymph node biopsy which showed a pathological complete response.  She is currently receiving adjuvant Keytruda.  Patient has completed adjuvant radiation therapy.   Interval history-she has completed adjuvant radiation and returns to clinic for consideration of continuation of adjuvant pembrolizumab. She tolerated radiation well. Radiation skin changes have mostly resolved. No rashes, diarrhea, or cough. Feels well and denies complaints.   ECOG PS- 1 Pain scale- 0   Review of systems- Review of  Systems  Constitutional:  Negative for chills, fever, malaise/fatigue and weight loss.  HENT:  Negative for congestion, ear discharge and nosebleeds.   Eyes:  Negative for blurred vision.  Respiratory:  Negative for cough, hemoptysis, sputum production, shortness of breath and wheezing.   Cardiovascular:  Negative for chest pain, palpitations, orthopnea and claudication.  Gastrointestinal:  Negative for abdominal pain, blood in stool, constipation, diarrhea, heartburn, melena, nausea and vomiting.  Genitourinary:  Negative for dysuria, flank pain, frequency, hematuria and urgency.  Musculoskeletal:  Negative for back pain, joint pain and myalgias.  Skin:  Negative for rash.  Neurological:  Negative for dizziness, tingling, focal weakness, seizures, weakness and headaches.  Endo/Heme/Allergies:  Does not bruise/bleed easily.  Psychiatric/Behavioral:  Negative for depression and suicidal ideas. The patient does not have insomnia.      No Active Allergies   Past Medical History:  Diagnosis Date   Breast cancer, right breast ([place])    Diabetes mellitus without complication ([place])    Family history of prostate cancer    Gastritis    Heart murmur    History of chemotherapy    History of pancreatitis    Hyperlipidemia    Hypertension    Palpitations    Port-A-Cath in place    Vitamin B12 deficiency     Past Surgical History:  Procedure Laterality Date   BREAST BIOPSY Right 11/2015   benign   BREAST BIOPSY Right 01/24/2022   INVASIVE MAMMARY CARCINOMA, NO SPECIAL TYPE   BREAST CYST EXCISION Right    CESAREAN SECTION     2x   CHOLECYSTECTOMY     IR CV LINE INJECTION  03/28/2022   PARTIAL MASTECTOMY WITH AXILLARY SENTINEL LYMPH NODE BIOPSY Right 08/16/2022   Procedure: PARTIAL MASTECTOMY WITH AXILLARY SENTINEL LYMPH NODE BIOPSY -- RF guided;  Surgeon: Herbert Pun, MD;  Location: [facility] ORS;  Service: General;  Laterality: Right;   PORTACATH PLACEMENT N/A 03/01/2022    Procedure: INSERTION PORT-A-CATH;  Surgeon: Herbert Pun, MD;  Location: [facility] ORS;  Service: General;  Laterality: N/A;    Social History   Socioeconomic History   Marital status: Married    Spouse name: Not on file   Number of children: Not on file   Years of education: Not on file   Highest education level: Not on file  Occupational History   Not on file  Tobacco Use   Smoking status: Never   Smokeless tobacco: Never  Vaping Use   Vaping Use: Never used  Substance and Sexual Activity   Alcohol use: Never   Drug use: Never   Sexual activity: Not on file  Other Topics Concern   Not on file  Social History Narrative   Not on file   Social Determinants of Health   Financial Resource Strain: Low Risk  (02/24/2022)   Overall Financial Resource Strain (CARDIA)    Difficulty of Paying Living Expenses: Not very hard  Food Insecurity: No Food Insecurity (12/04/2022)   Hunger Vital Sign    Worried About Running Out of Food in the Last Year: Never true    Ran Out of Food in the Last Year: Never true  Transportation Needs: No Transportation Needs (12/04/2022)   PRAPARE - Hydrologist (Medical): No    Lack of Transportation (Non-Medical): No  Physical Activity: Inactive (02/24/2022)   Exercise Vital Sign    Days of Exercise per Week: 0 days    Minutes of Exercise per Session: 0 min  Stress: No Stress Concern Present (02/24/2022)   [place]    Feeling of Stress : Only a little  Social Connections: Moderately Integrated (02/24/2022)   Social Connection and Isolation Panel [NHANES]    Frequency of Communication with Friends and Family: Three times a week    Frequency of Social Gatherings with Friends and Family: Three times a week    Attends Religious Services: 1 to 4 times per year    Active Member of Clubs or Organizations: No    Attends Archivist Meetings: Never     Marital Status: Married  Human resources officer Violence: Not At Risk (12/04/2022)   Humiliation, Afraid, Rape, and Kick questionnaire    Fear of Current or Ex-Partner: No    Emotionally Abused: No    Physically Abused: No    Sexually Abused: No    Family History  Problem Relation Age of Onset   Heart disease Father    Breast cancer Sister        38s   Prostate cancer Paternal Grandfather    Stomach cancer Cousin     Current Outpatient Medications:    albuterol (PROVENTIL) (2.5 MG/3ML) 0.083% nebulizer solution, Take 2.5 mg by nebulization every 4 (four) hours as needed for wheezing or shortness of breath., Disp: , Rfl:    albuterol (VENTOLIN HFA) 108 (90 Base) MCG/ACT inhaler, Inhale 2 puffs by mouth into the lungs every 6 hours as needed for difficulty breathing., Disp: 8 g, Rfl: 1   aspirin EC 81 MG tablet, Take 1 tablet (81 mg total) by mouth daily., Disp: 30 tablet, Rfl: 0  atorvastatin (LIPITOR) 80 MG tablet, Take 80 mg by mouth at bedtime., Disp: , Rfl:    benzonatate (TESSALON) 200 MG capsule, Take 1 capsule (200 mg total) by mouth 3 (three) times daily as needed for cough., Disp: 20 capsule, Rfl: 0   dextromethorphan-guaiFENesin (MUCINEX DM) 30-600 MG 12hr tablet, Take 1 tablet by mouth 2 (two) times daily., Disp: 14 tablet, Rfl: 0   DULoxetine (CYMBALTA) 60 MG capsule, Take 60 mg by mouth at bedtime., Disp: , Rfl:    enalapril (VASOTEC) 20 MG tablet, Take 20 mg by mouth every morning., Disp: , Rfl:    fenofibrate (TRICOR) 145 MG tablet, Take 145 mg by mouth every morning., Disp: , Rfl:    fluticasone (FLONASE) 50 MCG/ACT nasal spray, Place 1-2 sprays into both nostrils daily as needed for allergies or rhinitis., Disp: , Rfl:    gabapentin (NEURONTIN) 300 MG capsule, Take 300 mg by mouth 3 (three) times daily., Disp: , Rfl:    glipiZIDE (GLUCOTROL XL) 5 MG 24 hr tablet, Take 5 mg by mouth daily with breakfast., Disp: , Rfl:    hydrochlorothiazide (MICROZIDE) 12.5 MG capsule, Take  12.5 mg by mouth daily., Disp: , Rfl:    Melatonin 5 MG CAPS, Take 5 mg by mouth at bedtime as needed (sleep)., Disp: , Rfl:    meloxicam (MOBIC) 15 MG tablet, Take 15 mg by mouth daily., Disp: , Rfl:    metoprolol succinate (TOPROL XL) 25 MG 24 hr tablet, Take 1 tablet (25 mg total) by mouth daily., Disp: 90 tablet, Rfl: 3   TRUE METRIX BLOOD GLUCOSE TEST test strip, , Disp: , Rfl:    vitamin C (ASCORBIC ACID) 500 MG tablet, Take 500 mg by mouth daily., Disp: , Rfl:    zolpidem (AMBIEN) 5 MG tablet, Take 1 tablet (5 mg total) by mouth at bedtime as needed for sleep., Disp: 30 tablet, Rfl: 0  Physical exam:  Vitals:   02/13/23 1330  BP: (!) 151/79  Pulse: 81  Temp: 98.2 F (36.8 C)  TempSrc: Tympanic  SpO2: 98%  Weight: 120 lb 14.4 oz (54.8 kg)   Physical Exam Constitutional:      Appearance: She is not ill-appearing.  Cardiovascular:     Rate and Rhythm: Normal rate and regular rhythm.  Pulmonary:     Effort: Pulmonary effort is normal.     Breath sounds: Normal breath sounds.  Abdominal:     General: There is no distension.     Tenderness: There is no abdominal tenderness.  Skin:    General: Skin is warm.     Findings: No rash.  Neurological:     Mental Status: She is alert and oriented to person, place, and time.  Psychiatric:        Mood and Affect: Mood normal.        Behavior: Behavior normal.         Latest Ref Rng & Units 02/13/2023    1:18 PM  CMP  Glucose 70 - 99 mg/dL 129   BUN 8 - 23 mg/dL 25   Creatinine 0.44 - 1.00 mg/dL 0.72   Sodium 135 - 145 mmol/L 134   Potassium 3.5 - 5.1 mmol/L 3.5   Chloride 98 - 111 mmol/L 99   CO2 22 - 32 mmol/L 26   Calcium 8.9 - 10.3 mg/dL 9.2   Total Protein 6.5 - 8.1 g/dL 7.2   Total Bilirubin 0.3 - 1.2 mg/dL 0.5   Alkaline Phos 38 - 126  U/L 57   AST 15 - 41 U/L 27   ALT 0 - 44 U/L 27       Latest Ref Rng & Units 02/13/2023    1:18 PM  CBC  WBC 4.0 - 10.5 K/uL 5.3   Hemoglobin 12.0 - 15.0 g/dL 11.9   Hematocrit  36.0 - 46.0 % 34.8   Platelets 150 - 400 K/uL 254      Assessment and plan- Patient is a 67 y.o. female with   Triple negative breast cancer - stage IIb and cT2 N0 M0.  s/p neoadjuvant chemotherapy as per keynote 522 regimen.  She had right lumpectomy with sentinel lymph node biopsy which showed a pathological complete response.   She is here for on treatment assessment prior to cycle 8 of 9 of adjuvant Keytruda. Labs reviewed and acceptable for treatment.  Port-a-cath: in place and functioning well. Plan is to remove port with Dr Peyton Najjar after she completes her adjuvant [place].  Lung nodules- incidental. Plan is to repeat CT Chest w/o contrast in June 2024 to follow up on lung nodules Constipation- we reviewed the use of senna and/or miralax to prevent hardened stools. Plan for mag citrate or milk of magnesia for unrelieved symptoms.  Sinus congestion & allergies- reviewed use of otc antihistamines such as claritin or zyrtec as well as flonase.   Disposition:  Treatment today as scheduled 03/06/23- port/lab, Dr Janese Banks, cycle 9 of Beryle Flock Interpreter for appointments- [place]   Visit Diagnosis 1. Encounter for antineoplastic immunotherapy   2. Malignant neoplasm of lower-inner quadrant of right breast of female, estrogen receptor negative ([place])   3. Seasonal allergies   4. Constipation, unspecified constipation type    Due to language barrier, a spanish language interpreter was utilized for all patient interactions.   Beckey Rutter, DNP, AGNP-C, [place] [facility] [phone] (clinic) 02/13/2023

## 2023-02-13 NOTE — Patient Instructions (Signed)
It is common for patients who are undergoing treatment and taking certain prescribed medications to experience side-effects with constipation.  If you experience constipation, please take stool softeners such as Senna and/or Miralax every day to avoid constipation.  These medications are available over the counter.  Of course, if you have diarrhea, stop taking stool softeners.  Drinking plenty of fluid, eating fruits and vegetable, and being active also reduces the risk of constipation.   If despite taking stool softeners, and you still have no bowel movement for 2 days or more than your normal bowel habit frequency, please take one of the following over the counter laxatives:  Milk of Magnesia or Mag Citrate everyday and contact me immediately for further instructions.  The goal is to have at least one bowel movement every day or every other day without pain or straining.   For sinus/allergies, try taking flonase, 1 spray each nostril twice a day along with a zyrtec or claritin.   It was a pleasure meeting you today and thank you for allowing me to participate in your care. -Beckey Rutter, NP

## 2023-02-22 ENCOUNTER — Encounter: Payer: Self-pay | Admitting: Oncology

## 2023-02-28 IMAGING — MG DIGITAL SCREENING UNILAT LEFT W/ TOMO W/ CAD
4 series · 4 of 12 positions shown · non-contrast
Comparison: Previous exam(s).
COMPARISON: Previous exam(s).

Addendum:
CLINICAL DATA: Screening.

EXAM:
DIGITAL SCREENING UNILATERAL LEFT MAMMOGRAM WITH CAD AND
TOMOSYNTHESIS
TECHNIQUE: Left screening digital craniocaudal and mediolateral oblique
mammograms were obtained. Left screening digital breast
tomosynthesis was performed. The images were evaluated with
computer-aided detection.

[L MLO synth-2D]
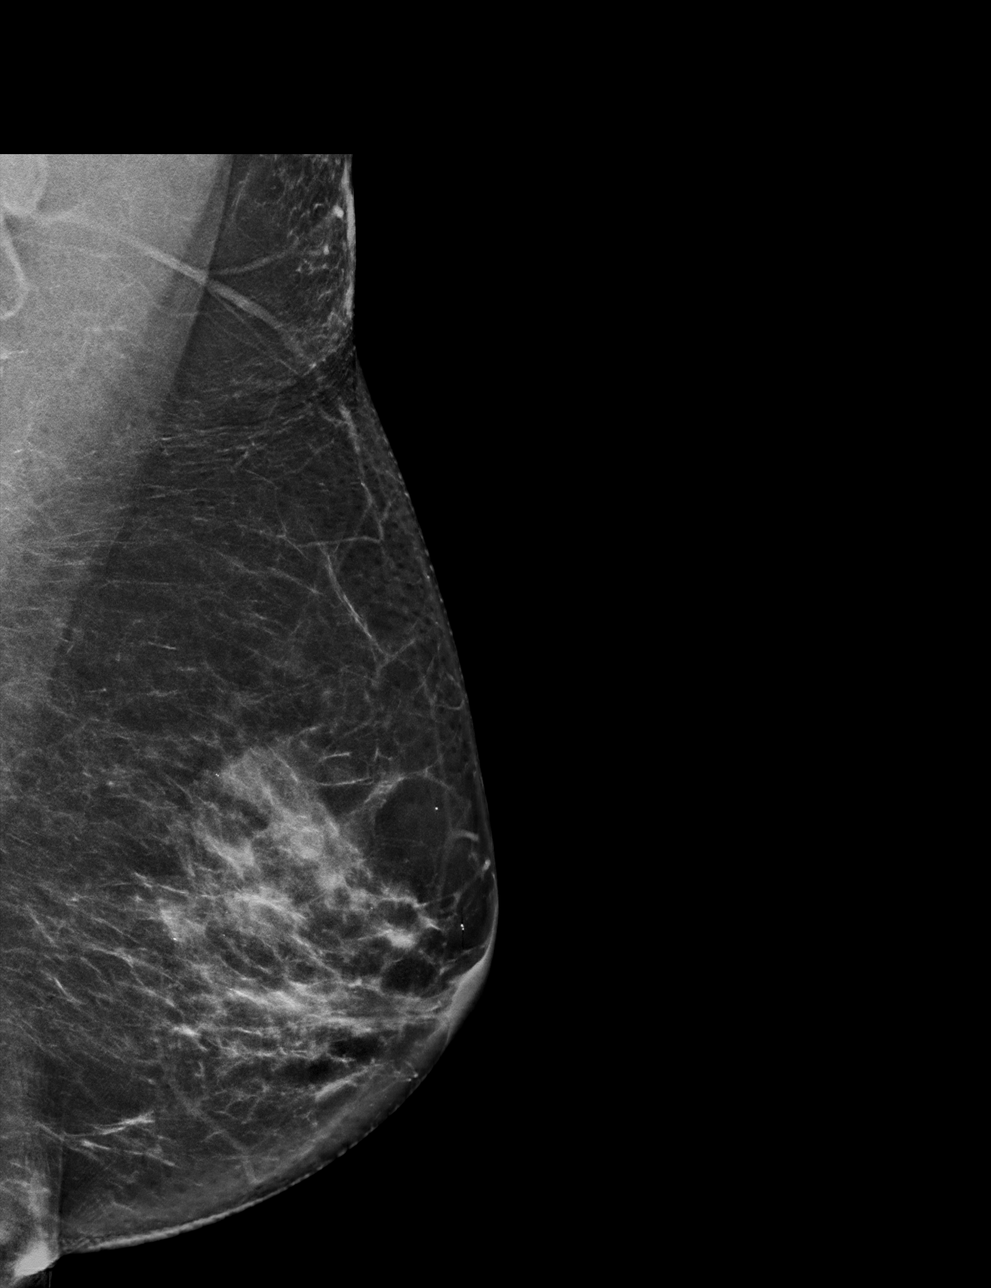

[L CC synth-2D]
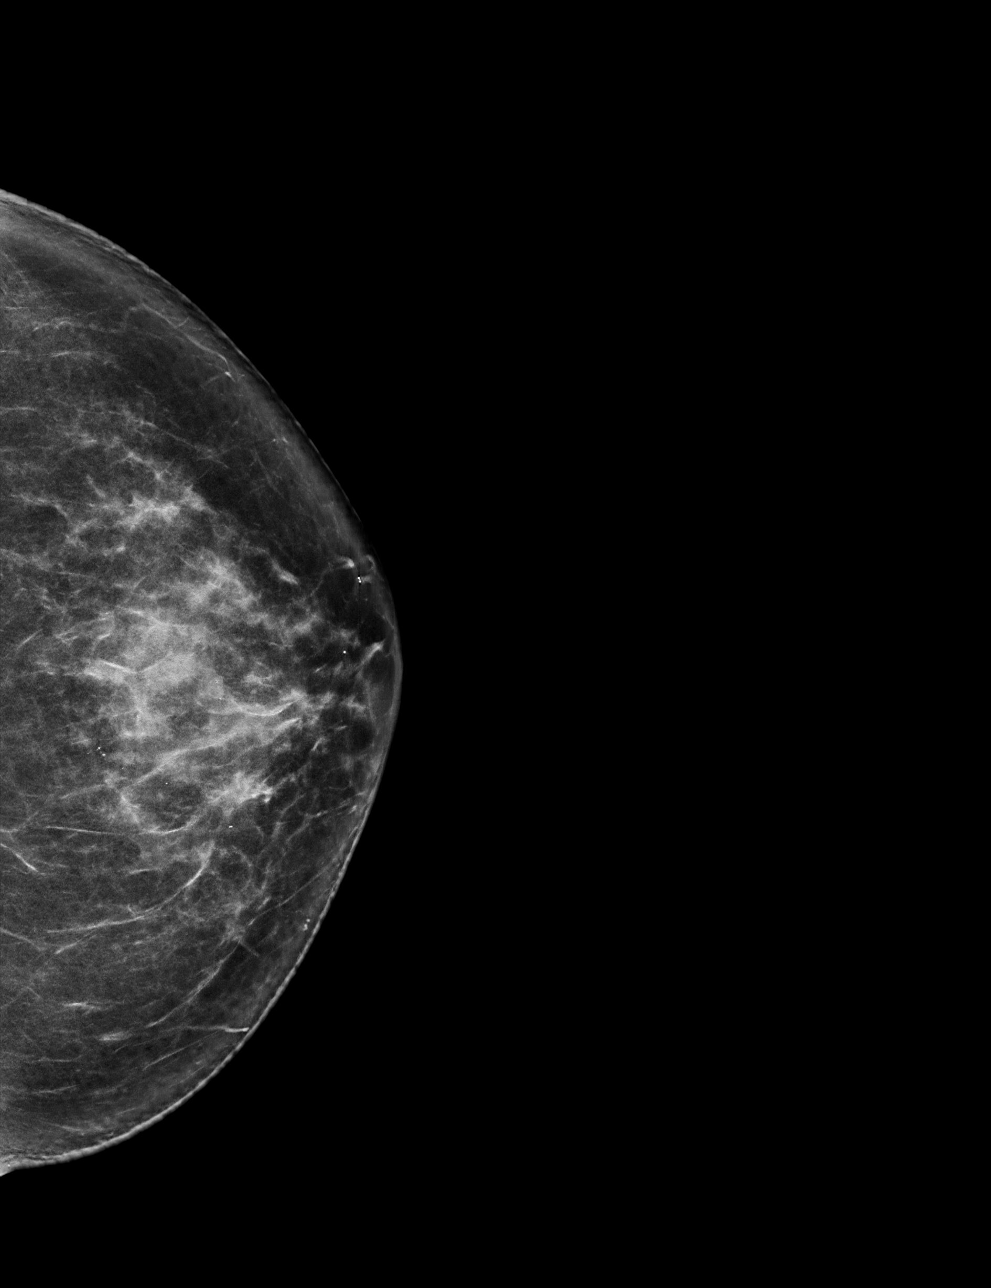

[L CC tomo · tomo slice 35/69.0]
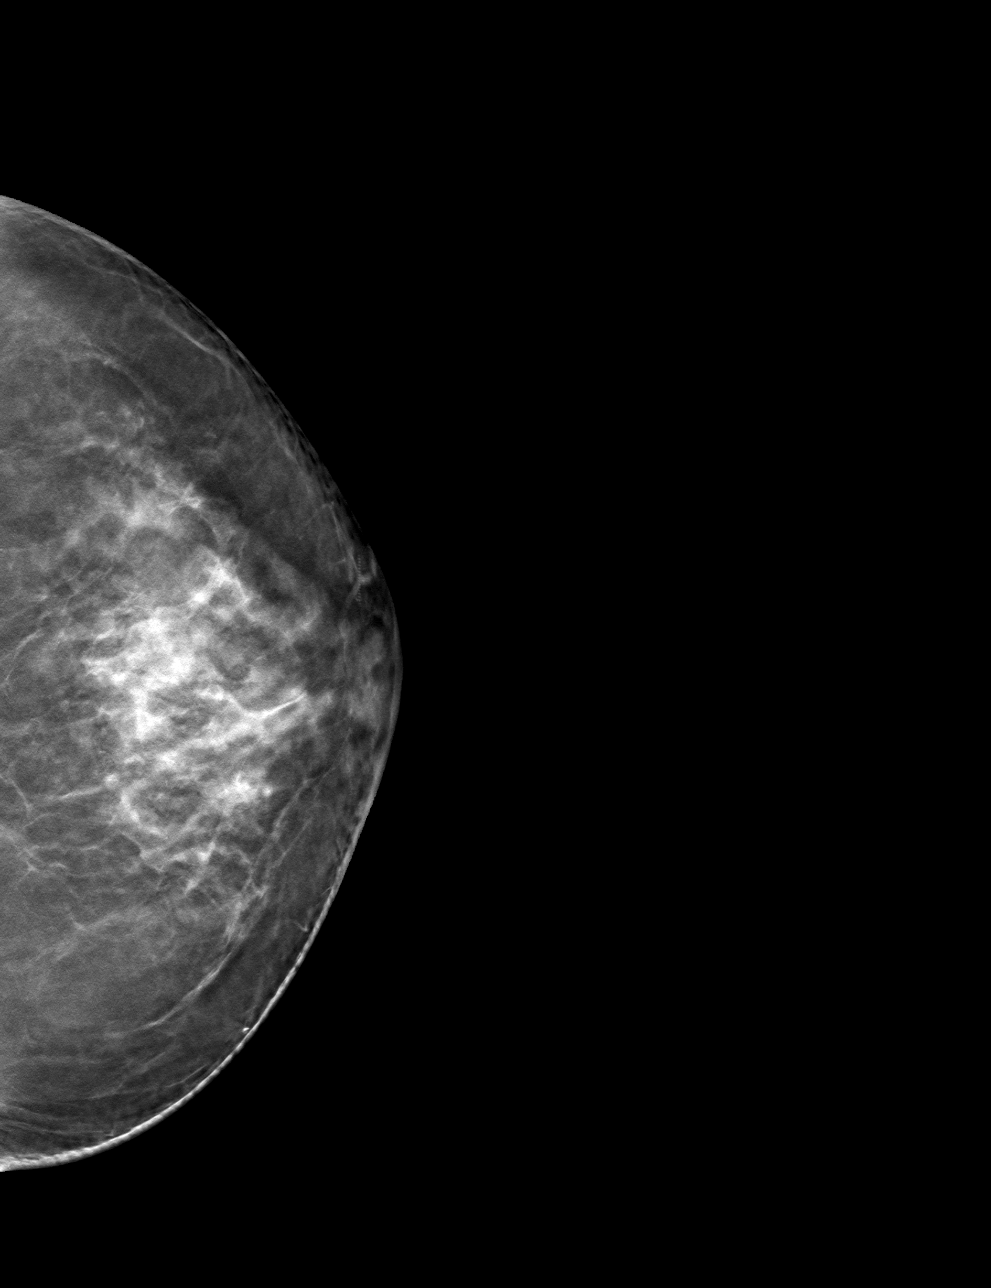

[L MLO tomo · tomo slice 37/72.0]
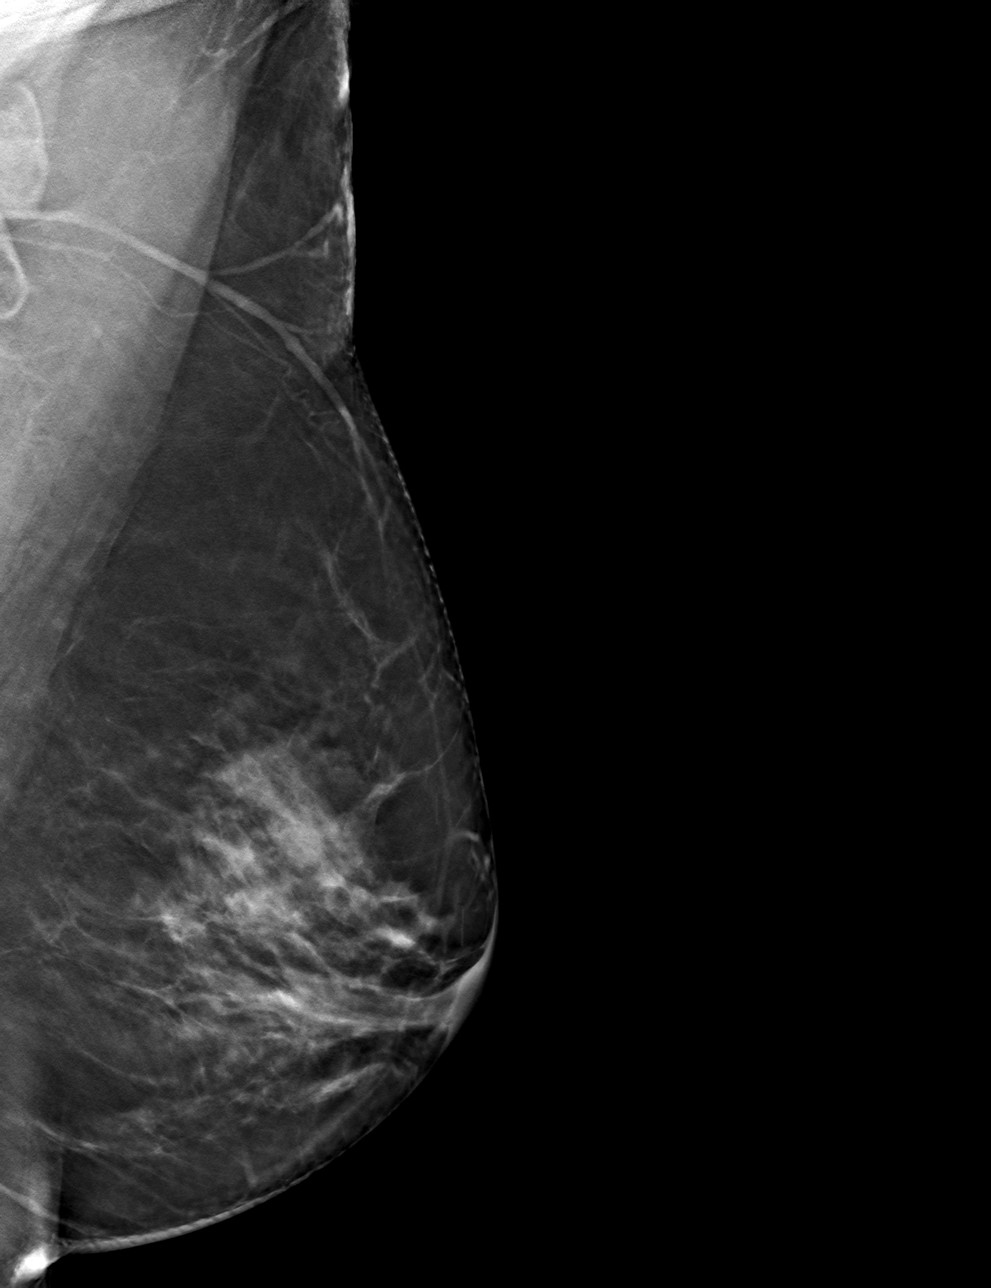

[4 of 12 positions shown; findings below may reference images not displayed]

ACR Breast Density Category c: The breast tissue is heterogeneously
dense, which may obscure small masses.
FINDINGS: The patient has had a right mastectomy. There are no findings
suspicious for malignancy.
IMPRESSION: No mammographic evidence of malignancy. A result letter of this
screening mammogram will be mailed directly to the patient.

RECOMMENDATION:
Screening mammogram in one year.  (Code:02-Z-UX2)

BI-RADS CATEGORY  1: Negative.

ADDENDUM:
Unilateral screening mammograms performed of the LEFT breast. Recent
diagnosis of invasive mammary carcinoma in the RIGHT breast. No
prior mastectomy.

*** End of Addendum ***
ACR Breast Density Category c: The breast tissue is heterogeneously
dense, which may obscure small masses.
FINDINGS: The patient has had a right mastectomy. There are no findings
suspicious for malignancy.
IMPRESSION: No mammographic evidence of malignancy. A result letter of this
screening mammogram will be mailed directly to the patient.

RECOMMENDATION:
Screening mammogram in one year.  (Code:02-Z-UX2)

BI-RADS CATEGORY  1: Negative.

## 2023-03-06 ENCOUNTER — Other Ambulatory Visit: Payer: Self-pay

## 2023-03-06 ENCOUNTER — Inpatient Hospital Stay (HOSPITAL_BASED_OUTPATIENT_CLINIC_OR_DEPARTMENT_OTHER): Payer: Medicare HMO | Admitting: Oncology

## 2023-03-06 ENCOUNTER — Inpatient Hospital Stay: Payer: Medicare HMO | Attending: Oncology

## 2023-03-06 ENCOUNTER — Inpatient Hospital Stay: Payer: Medicare HMO

## 2023-03-06 ENCOUNTER — Encounter: Payer: Self-pay | Admitting: Oncology

## 2023-03-06 VITALS — BP 135/66 | HR 90 | Temp 96.4°F | Resp 18 | Ht 61.0 in | Wt 120.3 lb

## 2023-03-06 DIAGNOSIS — Z5112 Encounter for antineoplastic immunotherapy: Secondary | ICD-10-CM | POA: Diagnosis present

## 2023-03-06 DIAGNOSIS — R911 Solitary pulmonary nodule: Secondary | ICD-10-CM | POA: Diagnosis not present

## 2023-03-06 DIAGNOSIS — Z171 Estrogen receptor negative status [ER-]: Secondary | ICD-10-CM

## 2023-03-06 DIAGNOSIS — C50311 Malignant neoplasm of lower-inner quadrant of right female breast: Secondary | ICD-10-CM | POA: Diagnosis not present

## 2023-03-06 LAB — COMPREHENSIVE METABOLIC PANEL
ALT: 27 U/L (ref 0–44)
AST: 28 U/L (ref 15–41)
Albumin: 3.9 g/dL (ref 3.5–5.0)
Alkaline Phosphatase: 67 U/L (ref 38–126)
Anion gap: 9 (ref 5–15)
BUN: 31 mg/dL — ABNORMAL HIGH (ref 8–23)
CO2: 26 mmol/L (ref 22–32)
Calcium: 9.2 mg/dL (ref 8.9–10.3)
Chloride: 97 mmol/L — ABNORMAL LOW (ref 98–111)
Creatinine, Ser: 0.77 mg/dL (ref 0.44–1.00)
GFR, Estimated: >60 mL/min (ref >60)
Glucose, Bld: 251 mg/dL — ABNORMAL HIGH (ref 70–99)
Potassium: 3.8 mmol/L (ref 3.5–5.1)
Sodium: 132 mmol/L — ABNORMAL LOW (ref 135–145)
Total Bilirubin: 0.5 mg/dL (ref 0.3–1.2)
Total Protein: 6.9 g/dL (ref 6.5–8.1)

## 2023-03-06 LAB — CBC WITH DIFFERENTIAL/PLATELET
Abs Immature Granulocytes: 0.01 10*3/uL (ref 0.00–0.07)
Basophils Absolute: 0.1 10*3/uL (ref 0.0–0.1)
Basophils Relative: 1 %
Eosinophils Absolute: 0.8 10*3/uL — ABNORMAL HIGH (ref 0.0–0.5)
Eosinophils Relative: 14 %
HCT: 35.7 % — ABNORMAL LOW (ref 36.0–46.0)
Hemoglobin: 12 g/dL (ref 12.0–15.0)
Immature Granulocytes: 0 %
Lymphocytes Relative: 18 %
Lymphs Abs: 1 10*3/uL (ref 0.7–4.0)
MCH: 32.3 pg (ref 26.0–34.0)
MCHC: 33.6 g/dL (ref 30.0–36.0)
MCV: 96.2 fL (ref 80.0–100.0)
Monocytes Absolute: 0.5 10*3/uL (ref 0.1–1.0)
Monocytes Relative: 8 %
Neutro Abs: 3.4 10*3/uL (ref 1.7–7.7)
Neutrophils Relative %: 59 %
Platelets: 225 10*3/uL (ref 150–400)
RBC: 3.71 MIL/uL — ABNORMAL LOW (ref 3.87–5.11)
RDW: 12.5 % (ref 11.5–15.5)
WBC: 5.8 10*3/uL (ref 4.0–10.5)
nRBC: 0 % (ref 0.0–0.2)

## 2023-03-06 MED ORDER — SODIUM CHLORIDE 0.9 % IV SOLN
200.0000 mg | Freq: Once | INTRAVENOUS | Status: AC
  Administered 2023-03-06: 200 mg via INTRAVENOUS
  Filled 2023-03-06: qty 8

## 2023-03-06 MED ORDER — SODIUM CHLORIDE 0.9 % IV SOLN
Freq: Once | INTRAVENOUS | Status: AC
  Filled 2023-03-06: qty 250

## 2023-03-06 MED ORDER — HEPARIN SOD (PORK) LOCK FLUSH 100 UNIT/ML IV SOLN
500.0000 [IU] | Freq: Once | INTRAVENOUS | Status: AC | PRN
  Administered 2023-03-06: 500 [IU]
  Filled 2023-03-06: qty 5

## 2023-03-06 NOTE — Progress Notes (Signed)
Patient states that she still has a lot of coughing and whissing.

## 2023-03-06 NOTE — Progress Notes (Signed)
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  It showed a suspicious mass at the 5:30 position of the right breast 4 cm from the nipple measuring 1.8 x 1.6 x 1.2 cm.  There may be a thickened echogenic rim which would bring the measurement to 2.9 x 1.9 x 2.3 cm.  Overlying skin thickening.  No axillary adenopathy.  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      Patient completed neoadjuvant intent with keynote 522 regimen starting with CarboTaxol Keytruda chemotherapy which she completed in August 2023.  Patient then underwent right lumpectomy  with sentinel lymph node biopsy which showed a pathological complete response.  She is currently receiving adjuvant Keytruda.  Patient has completed adjuvant radiation therapy.    Interval history-history obtained with the help of Spanish interpreter.  Overall patient is doing well and denies any specific complaints at this time  ECOG PS- 0 Pain scale- 0   Review of systems- Review of Systems  Constitutional:  Negative for chills, fever, malaise/fatigue and weight loss.  HENT:  Negative for congestion,  ear discharge and nosebleeds.   Eyes:  Negative for blurred vision.  Respiratory:  Negative for cough, hemoptysis, sputum production, shortness of breath and wheezing.   Cardiovascular:  Negative for chest pain, palpitations, orthopnea and claudication.  Gastrointestinal:  Negative for abdominal pain, blood in stool, constipation, diarrhea, heartburn, melena, nausea and vomiting.  Genitourinary:  Negative for dysuria, flank pain, frequency, hematuria and urgency.  Musculoskeletal:  Negative for back pain, joint pain and myalgias.  Skin:  Negative for rash.  Neurological:  Negative for dizziness, tingling, focal weakness, seizures, weakness and headaches.  Endo/Heme/Allergies:  Does not bruise/bleed easily.  Psychiatric/Behavioral:  Negative for depression and suicidal ideas. The patient does not have insomnia.       No Active Allergies   Past Medical History:  Diagnosis Date   Breast cancer, right breast    Diabetes mellitus without complication    Family history of prostate cancer    Gastritis    Heart murmur    History of chemotherapy    History of pancreatitis    Hyperlipidemia    Hypertension    Palpitations    Port-A-Cath in place    Vitamin B12 deficiency      Past Surgical History:  Procedure Laterality Date   BREAST BIOPSY Right 11/2015   benign   BREAST BIOPSY Right 01/24/2022   INVASIVE MAMMARY CARCINOMA, NO SPECIAL TYPE   BREAST CYST EXCISION Right    CESAREAN SECTION     2x   CHOLECYSTECTOMY     IR CV LINE INJECTION  03/28/2022   PARTIAL MASTECTOMY WITH AXILLARY SENTINEL LYMPH NODE  BIOPSY Right 08/16/2022   Procedure: PARTIAL MASTECTOMY WITH AXILLARY SENTINEL LYMPH NODE BIOPSY -- RF guided;  Surgeon: Herbert Pun, MD;  Location: [facility] ORS;  Service: General;  Laterality: Right;   PORTACATH PLACEMENT N/A 03/01/2022   Procedure: INSERTION PORT-A-CATH;  Surgeon: Herbert Pun, MD;  Location: [facility] ORS;  Service: General;  Laterality: N/A;     Social History   Socioeconomic History   Marital status: Married    Spouse name: Not on file   Number of children: Not on file   Years of education: Not on file   Highest education level: Not on file  Occupational History   Not on file  Tobacco Use   Smoking status: Never   Smokeless tobacco: Never  Vaping Use   Vaping Use: Never used  Substance and Sexual Activity   Alcohol use: Never   Drug use: Never   Sexual activity: Not on file  Other Topics Concern   Not on file  Social History Narrative   Not on file   Social Determinants of Health   Financial Resource Strain: Low Risk  (02/24/2022)   Overall Financial Resource Strain (CARDIA)    Difficulty of Paying Living Expenses: Not very hard  Food Insecurity: No Food Insecurity (12/04/2022)   Hunger Vital Sign    Worried About Running Out of Food in the Last Year: Never true    Ran Out of Food in the Last Year: Never true  Transportation Needs: No Transportation Needs (12/04/2022)   PRAPARE - Hydrologist (Medical): No    Lack of Transportation (Non-Medical): No  Physical Activity: Inactive (02/24/2022)   Exercise Vital Sign    Days of Exercise per Week: 0 days    Minutes of Exercise per Session: 0 min  Stress: No Stress Concern Present (02/24/2022)   [place]    Feeling of Stress : Only a little  Social Connections: Moderately Integrated (02/24/2022)   Social Connection and Isolation Panel [NHANES]    Frequency of Communication with Friends and Family: Three times a week    Frequency of Social Gatherings with Friends and Family: Three times a week    Attends Religious Services: 1 to 4 times per year    Active Member of Clubs or Organizations: No    Attends Archivist Meetings: Never    Marital Status: Married  Human resources officer Violence: Not At Risk (12/04/2022)   Humiliation, Afraid, Rape, and Kick questionnaire     Fear of Current or Ex-Partner: No    Emotionally Abused: No    Physically Abused: No    Sexually Abused: No    Family History  Problem Relation Age of Onset   Heart disease Father    Breast cancer Sister        68s   Prostate cancer Paternal Grandfather    Stomach cancer Cousin      Current Outpatient Medications:    albuterol (PROVENTIL) (2.5 MG/3ML) 0.083% nebulizer solution, Take 2.5 mg by nebulization every 4 (four) hours as needed for wheezing or shortness of breath., Disp: , Rfl:    albuterol (VENTOLIN HFA) 108 (90 Base) MCG/ACT inhaler, Inhale 2 puffs by mouth into the lungs every 6 hours as needed for difficulty breathing., Disp: 8 g, Rfl: 1   aspirin EC 81 MG tablet, Take 1 tablet (81 mg total) by mouth daily., Disp: 30 tablet, Rfl: 0   atorvastatin (LIPITOR) 80 MG tablet, Take 80  mg by mouth at bedtime., Disp: , Rfl:    benzonatate (TESSALON) 200 MG capsule, Take 1 capsule (200 mg total) by mouth 3 (three) times daily as needed for cough., Disp: 20 capsule, Rfl: 0   dextromethorphan-guaiFENesin (MUCINEX DM) 30-600 MG 12hr tablet, Take 1 tablet by mouth 2 (two) times daily., Disp: 14 tablet, Rfl: 0   DULoxetine (CYMBALTA) 60 MG capsule, Take 60 mg by mouth at bedtime., Disp: , Rfl:    enalapril (VASOTEC) 20 MG tablet, Take 20 mg by mouth every morning., Disp: , Rfl:    fenofibrate (TRICOR) 145 MG tablet, Take 145 mg by mouth every morning., Disp: , Rfl:    fluticasone (FLONASE) 50 MCG/ACT nasal spray, Place 1-2 sprays into both nostrils daily as needed for allergies or rhinitis., Disp: , Rfl:    gabapentin (NEURONTIN) 300 MG capsule, Take 300 mg by mouth 3 (three) times daily., Disp: , Rfl:    glipiZIDE (GLUCOTROL XL) 5 MG 24 hr tablet, Take 5 mg by mouth daily with breakfast., Disp: , Rfl:    hydrochlorothiazide (MICROZIDE) 12.5 MG capsule, Take 12.5 mg by mouth daily., Disp: , Rfl:    Melatonin 5 MG CAPS, Take 5 mg by mouth at bedtime as needed (sleep)., Disp: , Rfl:     meloxicam (MOBIC) 15 MG tablet, Take 15 mg by mouth daily., Disp: , Rfl:    metoprolol succinate (TOPROL XL) 25 MG 24 hr tablet, Take 1 tablet (25 mg total) by mouth daily., Disp: 90 tablet, Rfl: 3   tiZANidine (ZANAFLEX) 4 MG tablet, Take 4 mg by mouth 3 (three) times daily., Disp: , Rfl:    TRUE METRIX BLOOD GLUCOSE TEST test strip, , Disp: , Rfl:    vitamin C (ASCORBIC ACID) 500 MG tablet, Take 500 mg by mouth daily., Disp: , Rfl:    zolpidem (AMBIEN) 5 MG tablet, Take 1 tablet (5 mg total) by mouth at bedtime as needed for sleep., Disp: 30 tablet, Rfl: 0  Physical exam:  Vitals:   03/06/23 0916  BP: 135/66  Pulse: 90  Resp: 18  Temp: (!) 96.4 F (35.8 C)  TempSrc: Tympanic  SpO2: 96%  Weight: 120 lb 4.8 oz (54.6 kg)  Height: 5\' 1"  (1.549 m)   Physical Exam Cardiovascular:     Rate and Rhythm: Normal rate and regular rhythm.     Heart sounds: Normal heart sounds.  Pulmonary:     Effort: Pulmonary effort is normal.  Skin:    General: Skin is warm and dry.  Neurological:     Mental Status: She is alert and oriented to person, place, and time.         Latest Ref Rng & Units 03/06/2023    9:03 AM  CMP  Glucose 70 - 99 mg/dL 251   BUN 8 - 23 mg/dL 31   Creatinine 0.44 - 1.00 mg/dL 0.77   Sodium 135 - 145 mmol/L 132   Potassium 3.5 - 5.1 mmol/L 3.8   Chloride 98 - 111 mmol/L 97   CO2 22 - 32 mmol/L 26   Calcium 8.9 - 10.3 mg/dL 9.2   Total Protein 6.5 - 8.1 g/dL 6.9   Total Bilirubin 0.3 - 1.2 mg/dL 0.5   Alkaline Phos 38 - 126 U/L 67   AST 15 - 41 U/L 28   ALT 0 - 44 U/L 27       Latest Ref Rng & Units 03/06/2023    9:03 AM  CBC  WBC 4.0 -  10.5 K/uL 5.8   Hemoglobin 12.0 - 15.0 g/dL 12.0   Hematocrit 36.0 - 46.0 % 35.7   Platelets 150 - 400 K/uL 225      Assessment and plan- Patient is a 67 y.o. female with triple negative breast cancer stage IIb and cT2 N0 M0.  She is s/p neoadjuvant chemotherapy as per keynote 522 regimen.  She had right lumpectomy with  sentinel lymph node biopsy which showed a pathological complete response.  She completed adjuvant radiation therapy.  She is here for on treatment assessment prior to cycle 9 of adjuvant Keytruda  Counts okay to proceed with cycle 9 of adjuvant Keytruda today which would be her last cycle.  Her port can be taken out at this time.  Patient was found to have right lung nodule based on CT scan in December 2020.  I will plan to follow this up with a repeat CT in June 2024 and see her thereafter.  Mammograms will be coordinated by Dr. Peyton Najjar.  Patient is considering relocating to [place] in the later part of the year and she may require annual oncology follow-up there   Visit Diagnosis 1. Lung nodule   2. Malignant neoplasm of lower-inner quadrant of right breast of female, estrogen receptor negative   3. Encounter for antineoplastic immunotherapy      Dr. Randa Evens, MD, MPH [facility] [id number] 03/06/2023 12:41 PM

## 2023-03-08 ENCOUNTER — Encounter: Payer: Self-pay | Admitting: Oncology

## 2023-03-08 ENCOUNTER — Ambulatory Visit: Payer: Medicare HMO | Admitting: Radiation Oncology

## 2023-03-27 IMAGING — DX DG CHEST 1V PORT
1 series · 1 of 1 positions shown · non-contrast
Comparison: Portable exam 6880 hours without priors for comparison

CLINICAL DATA: Port-A-Cath insertion

EXAM:
PORTABLE CHEST 1 VIEW

[chest ap]
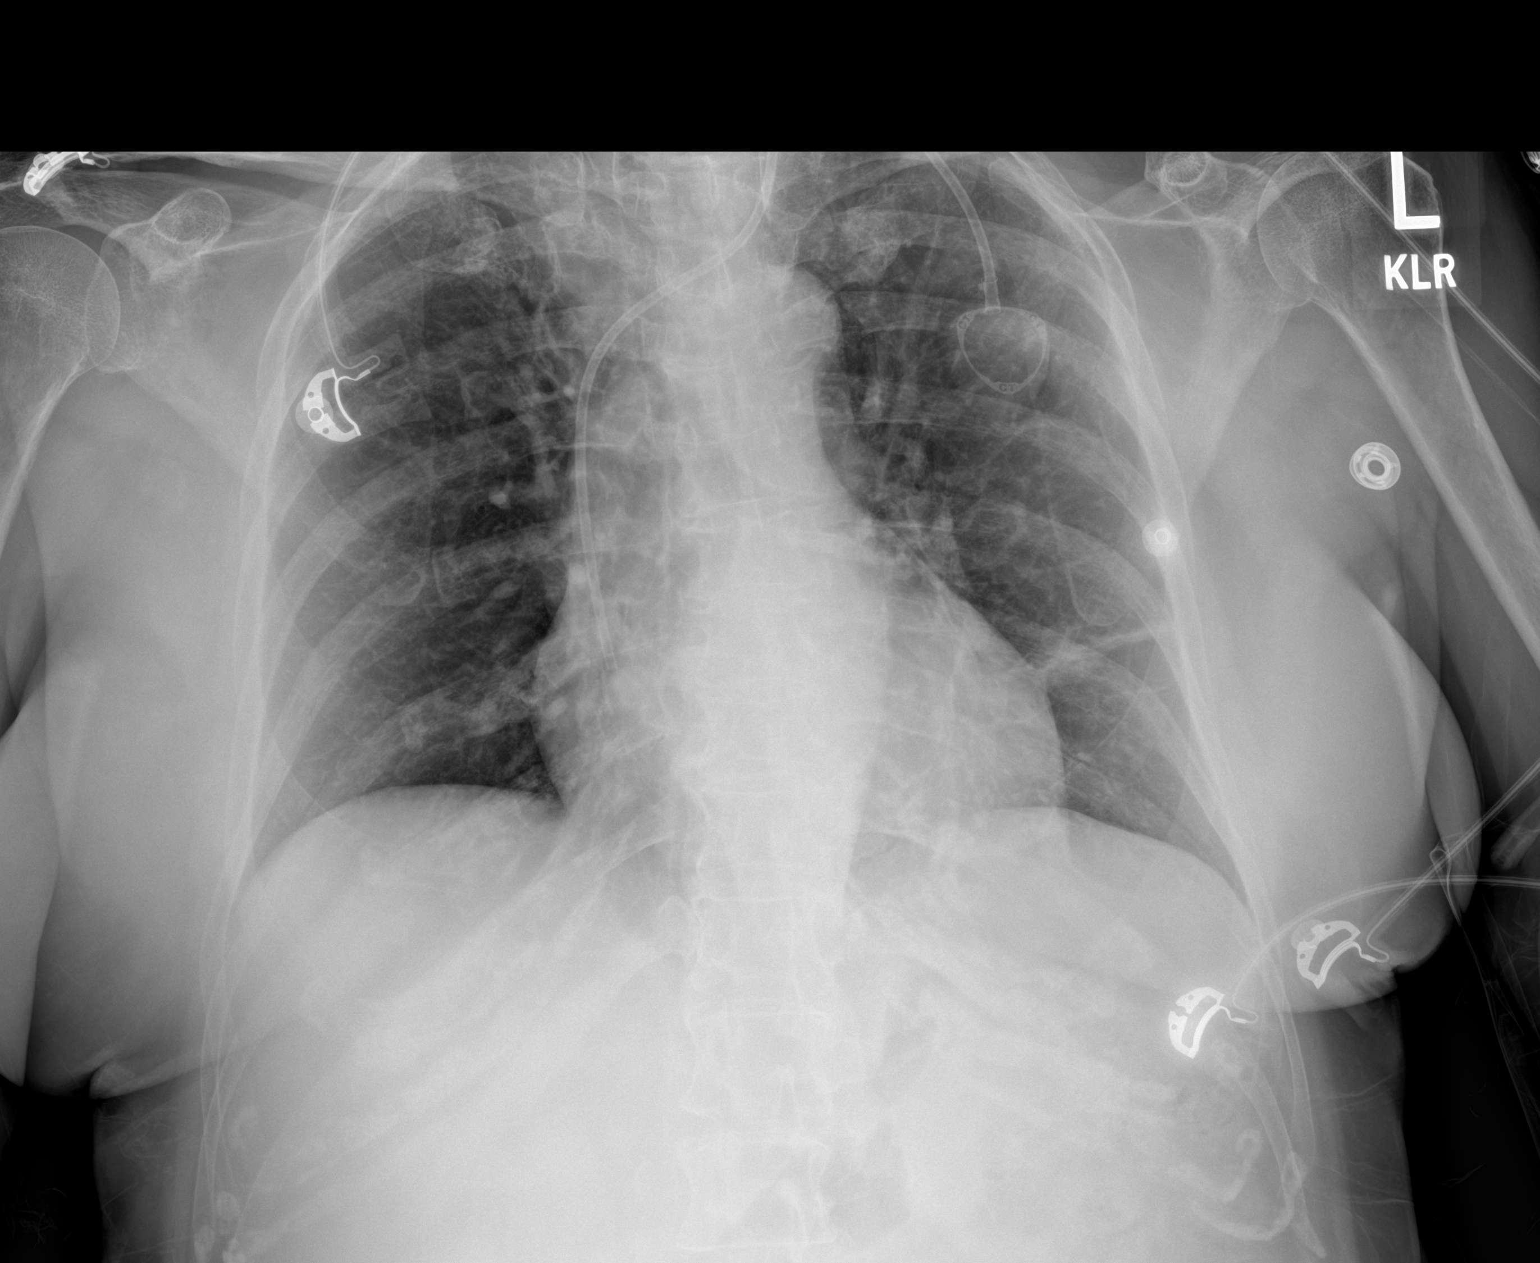

[1 of 1 positions shown; findings below may reference images not displayed]

FINDINGS: LEFT jugular Port-A-Cath with tip projecting over cavoatrial
junction.

Normal heart size, mediastinal contours, and pulmonary vascularity.

Atherosclerotic calcification aorta.

Subsegmental atelectasis LEFT base.

Lungs otherwise clear.

No pulmonary infiltrate, pleural effusion, or pneumothorax.
IMPRESSION: No pneumothorax following LEFT jugular Port-A-Cath insertion.

Subsegmental atelectasis LEFT base.

## 2023-04-03 ENCOUNTER — Other Ambulatory Visit: Payer: Self-pay | Admitting: General Surgery

## 2023-04-03 DIAGNOSIS — C50311 Malignant neoplasm of lower-inner quadrant of right female breast: Secondary | ICD-10-CM

## 2023-04-13 ENCOUNTER — Other Ambulatory Visit: Payer: Self-pay | Admitting: General Surgery

## 2023-04-13 ENCOUNTER — Ambulatory Visit
Admission: RE | Admit: 2023-04-13 | Discharge: 2023-04-13 | Disposition: A | Discharge status: Home / Self Care | Payer: Medicare HMO | Source: Ambulatory Visit | Attending: General Surgery | Admitting: General Surgery

## 2023-04-13 DIAGNOSIS — Z171 Estrogen receptor negative status [ER-]: Secondary | ICD-10-CM

## 2023-04-13 DIAGNOSIS — C50311 Malignant neoplasm of lower-inner quadrant of right female breast: Secondary | ICD-10-CM | POA: Insufficient documentation

## 2023-04-13 DIAGNOSIS — N63 Unspecified lump in unspecified breast: Secondary | ICD-10-CM

## 2023-04-13 DIAGNOSIS — R928 Other abnormal and inconclusive findings on diagnostic imaging of breast: Secondary | ICD-10-CM

## 2023-04-17 ENCOUNTER — Encounter: Payer: Self-pay | Admitting: General Surgery

## 2023-04-18 ENCOUNTER — Ambulatory Visit
Admission: RE | Admit: 2023-04-18 | Discharge: 2023-04-18 | Disposition: A | Discharge status: Home / Self Care | Payer: Medicare HMO | Source: Ambulatory Visit | Attending: General Surgery | Admitting: General Surgery

## 2023-04-18 DIAGNOSIS — R928 Other abnormal and inconclusive findings on diagnostic imaging of breast: Secondary | ICD-10-CM

## 2023-04-18 DIAGNOSIS — N63 Unspecified lump in unspecified breast: Secondary | ICD-10-CM

## 2023-04-18 HISTORY — PX: BREAST BIOPSY: SHX20

## 2023-04-18 MED ORDER — LIDOCAINE-EPINEPHRINE 1 %-1:100000 IJ SOLN
10.0000 mL | Freq: Once | INTRAMUSCULAR | Status: AC
  Administered 2023-04-18: 10 mL via INTRADERMAL

## 2023-04-18 MED ORDER — LIDOCAINE HCL (PF) 1 % IJ SOLN
5.0000 mL | Freq: Once | INTRAMUSCULAR | Status: AC
  Administered 2023-04-18: 5 mL via INTRADERMAL

## 2023-04-20 ENCOUNTER — Encounter: Payer: Self-pay | Admitting: Oncology

## 2023-04-23 ENCOUNTER — Other Ambulatory Visit: Payer: Self-pay

## 2023-04-23 ENCOUNTER — Encounter: Payer: Self-pay | Admitting: Oncology

## 2023-04-23 LAB — SURGICAL PATHOLOGY

## 2023-04-23 IMAGING — XA IR CENTRAL VENOUS CATHETER
1 series · 13 of 13 positions shown · IV contrast (IODINE)
Comparison: none

CLINICAL DATA: port not working -- difficult  aspiration

EXAM:
PORT  CATHETER INJECTION UNDER FLUOROSCOPY
TECHNIQUE: The procedure, risks (including but not limited to bleeding,
infection, organ damage ), benefits, and alternatives were explained
to the patient. Questions regarding the procedure were encouraged
and answered. The patient understands and consents to the procedure.

[Series 1: vasc extremity · 4 acquisitions, 13 frames shown]
[im 1/4]
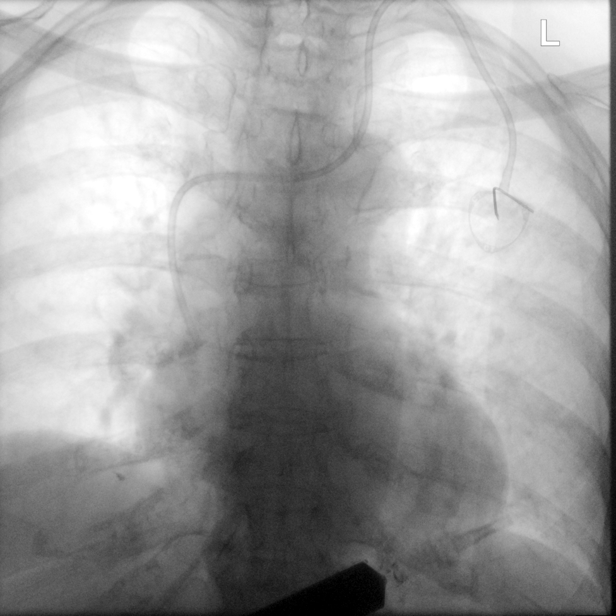
[im 2/4]
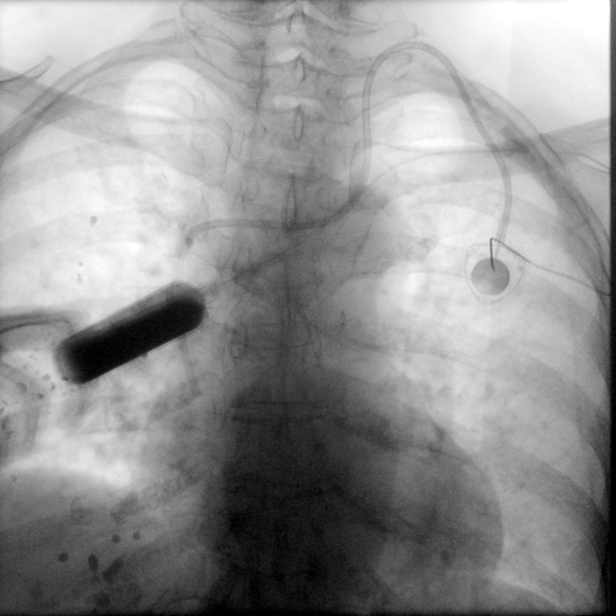
[im 2/4]
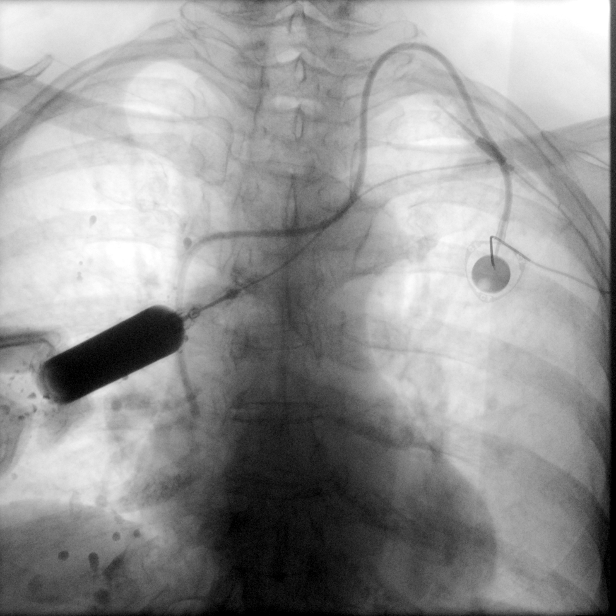
[im 2/4]
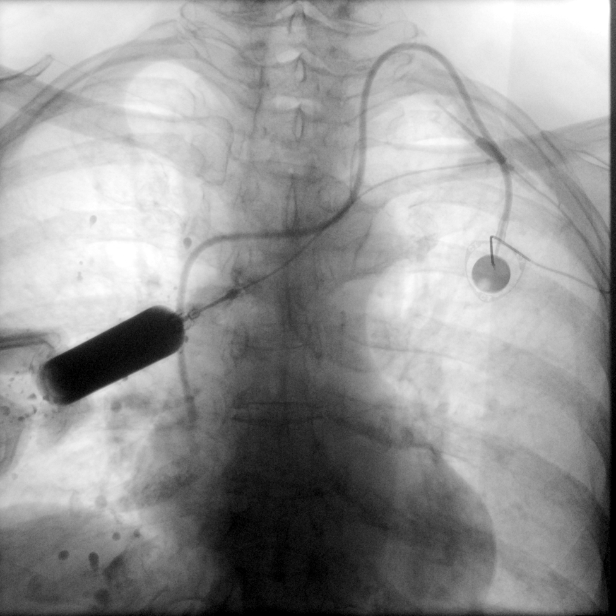
[im 2/4]
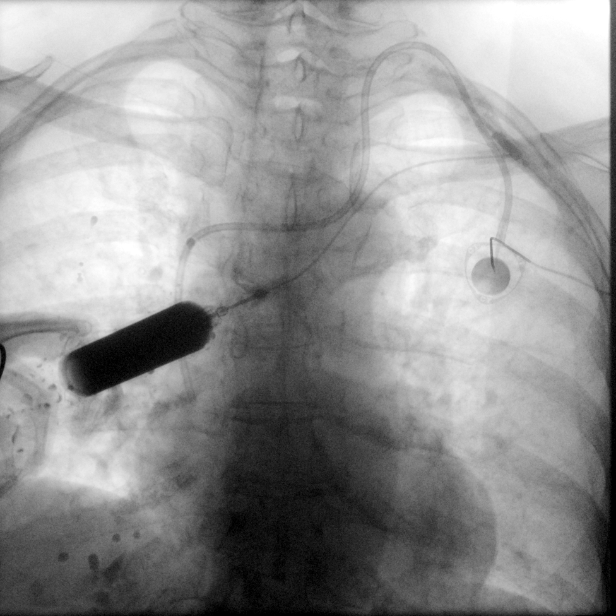
[im 3/4]
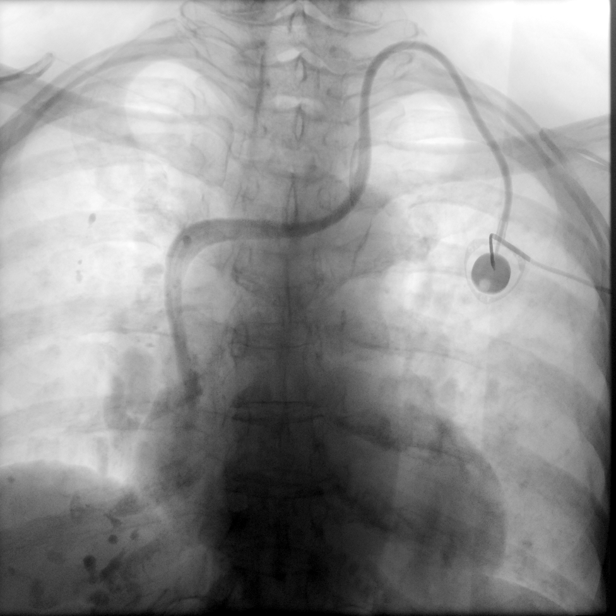
[im 3/4]
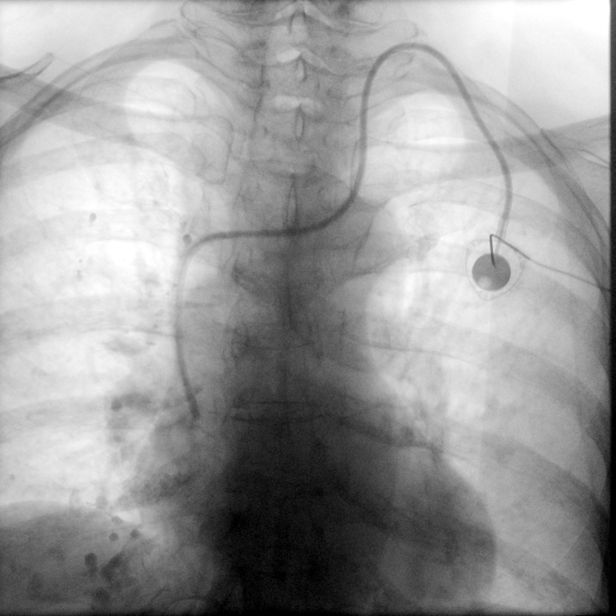
[im 3/4]
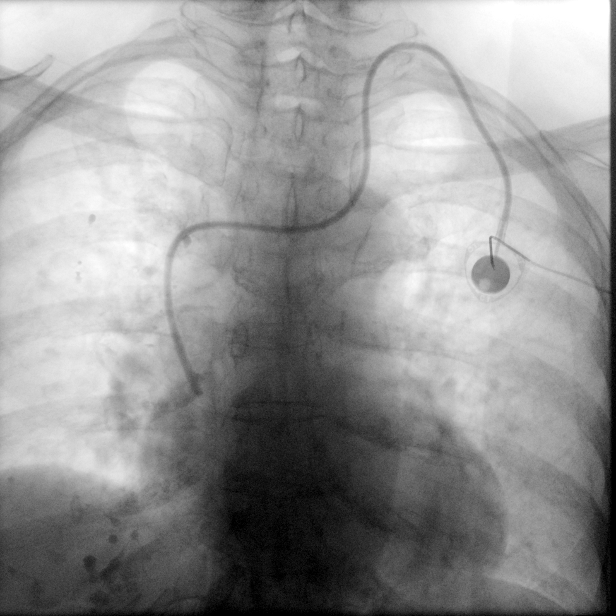
[im 3/4]
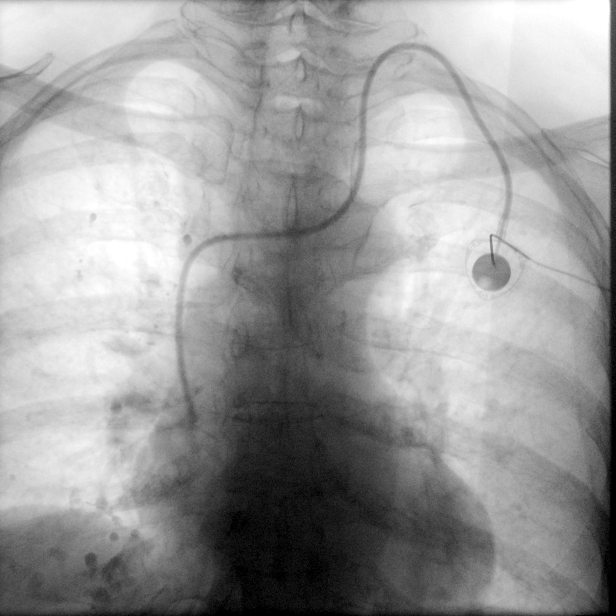
[im 4/4]
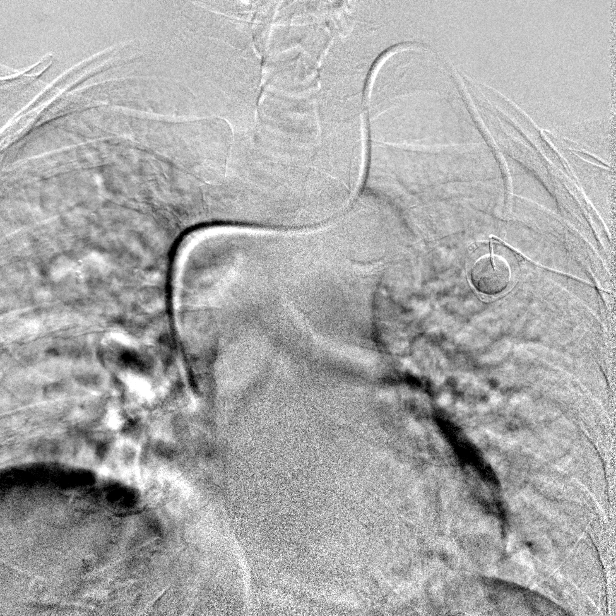
[im 4/4]
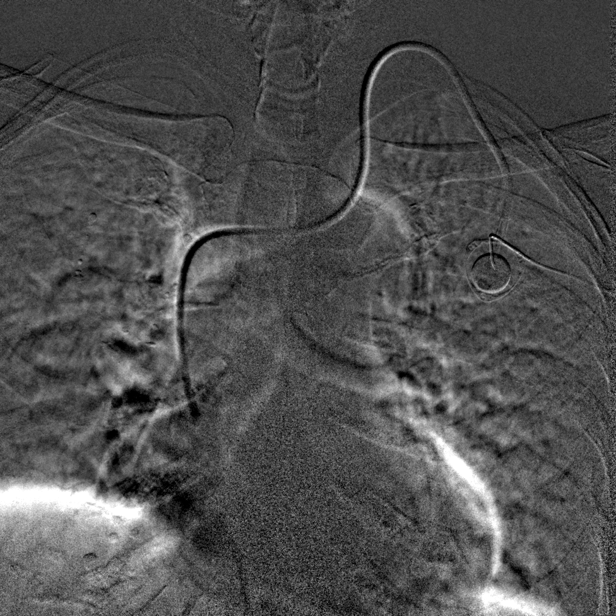
[im 4/4]
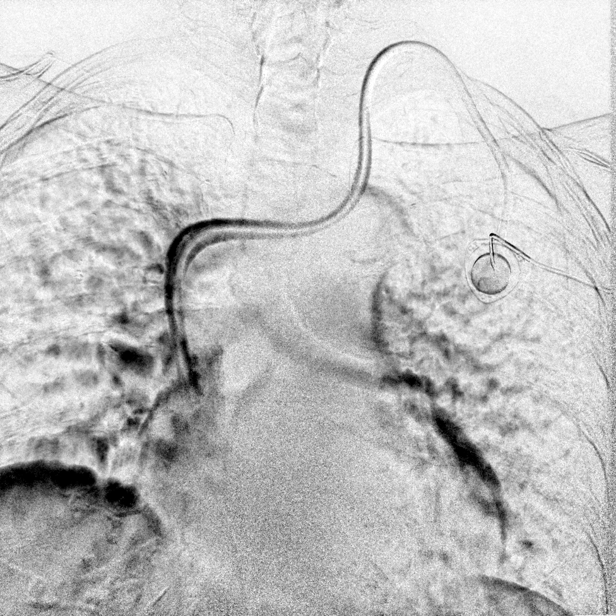
[im 4/4]
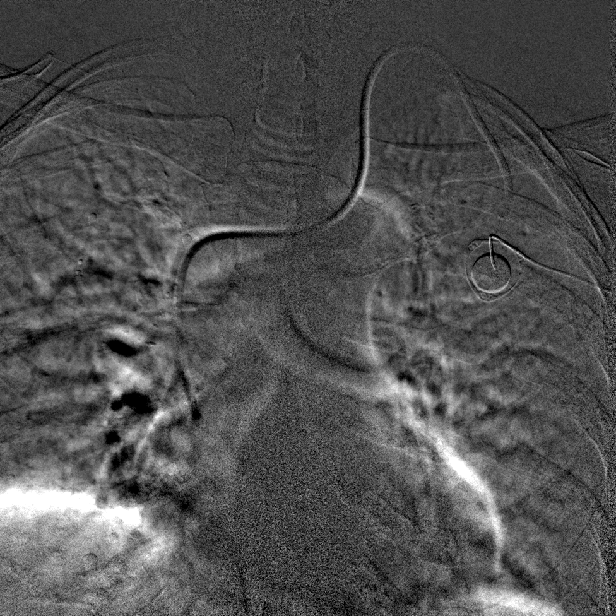

[13 of 13 positions shown; findings below may reference images not displayed]

Survey fluoroscopic inspection reveals left IJ port catheter, tip at
the cavoatrial junction. No fracture, kink, or evident stenosis. No
pneumothorax.

Injection demonstrates patency of the reservoir and port catheter.
No extravasation or leak. There is partially encasing fibrin sheath
over a short segment at the tip of the catheter. Distal SVC and
right atrium widely patent.
IMPRESSION: 1. Left IJ port catheter well positioned and intact, tip at the
cavoatrial junction.
2. Partially encasing fibrin sheath at the tip.

RECOMMENDATIONS:
*Cathflo Activase dwell per protocol.
*Catheter is okay to use for infusion.

## 2023-04-23 NOTE — Telephone Encounter (Signed)
Sent to [facility] to schedule CT Scan 04/23/23

## 2023-04-24 ENCOUNTER — Ambulatory Visit: Payer: Self-pay | Admitting: General Surgery

## 2023-04-24 ENCOUNTER — Other Ambulatory Visit: Payer: Self-pay | Admitting: General Surgery

## 2023-04-24 DIAGNOSIS — N6091 Unspecified benign mammary dysplasia of right breast: Secondary | ICD-10-CM

## 2023-04-24 NOTE — H&P (View-Only) (Signed)
HISTORY OF PRESENT ILLNESS:    Susan [name] is a 67 y.o.female patient who comes for breast cancer surveillance.  Susan [name] was diagnosed with breast cancer on February 2023.  She initially presented with self palpable mass of the right breast.  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  She completed neoadjuvant chemotherapy.  Patient was found with stage IIb, cT2, N0, M0 right breast cancer.  Patient completed 4 cycle of AC Keytruda on 07/31/2022.  She had partial mastectomy with sentinel node biopsy.  She was found with complaint chemotherapy response.  She comes today for surveillance with breast physical exam and mammogram.  She had a diagnostic mammogram that showed 10 mm possible mass on the right breast at the 12 o'clock position.  She had a core biopsy of bleeding area that showed atypical cells.  As per pathologist this focus disappears but cannot rule out malignancy.      PAST MEDICAL HISTORY:  Past Medical History:  Diagnosis Date   Diabetes mellitus type 2 in nonobese (CMS/HHS-HCC)    Hyperlipemia    Hypertension         PAST SURGICAL HISTORY:   Past Surgical History:  Procedure Laterality Date   MASTECTOMY PARTIAL Right 08/16/2022   Dr Kosta Schnitzler Cintron --- w/ RF & SN   BREAST EXCISIONAL BIOPSY Right    prior to 2018   CHOLECYSTECTOMY           MEDICATIONS:  Outpatient Encounter Medications as of 04/24/2023  Medication Sig Dispense Refill   atorvastatin (LIPITOR) 40 MG tablet Take 40 mg by mouth once daily     DULoxetine (CYMBALTA) 60 MG DR capsule Take by mouth once daily     enalapril (VASOTEC) 20 MG tablet Take 20 mg by mouth once daily     fenofibrate nanocrystallized (TRICOR) 145 MG tablet Take by mouth once daily     fluticasone propionate (FLONASE) 50 mcg/actuation nasal spray as needed     gabapentin (NEURONTIN) 300 MG capsule Take by mouth 2 (two) times daily     glipiZIDE (GLUCOTROL) 2.5 MG XL tablet Take by mouth once daily      hydroCHLOROthiazide (MICROZIDE) 12.5 mg capsule Take by mouth once daily     meloxicam (MOBIC) 15 MG tablet as needed     metoprolol succinate (TOPROL-XL) 25 MG XL tablet Take 1 tablet by mouth once daily     naproxen (NAPROSYN) 500 MG tablet Take 500 mg by mouth as needed     LIDODERM 5 % patch once daily (Patient not taking: Reported on 08/02/2022)     tiZANidine (ZANAFLEX) 4 MG tablet Take 4 mg by mouth as needed (Patient not taking: Reported on 08/02/2022)     No facility-administered encounter medications on file as of 04/24/2023.     ALLERGIES:   Iodinated contrast media   SOCIAL HISTORY:  Social History   Socioeconomic History   Marital status: Married  Tobacco Use   Smoking status: Never   Smokeless tobacco: Never  Vaping Use   Vaping status: Never Used  Substance and Sexual Activity   Alcohol use: Never   Drug use: Never   Social Determinants of Health   Financial Resource Strain: Low Risk  (02/24/2022)   Received from [place], [place]   Overall Financial Resource Strain (CARDIA)    Difficulty of Paying Living Expenses: Not very hard  Food Insecurity: No Food Insecurity (12/04/2022)   Received from [place],    Hunger Vital   Sign    Worried About Running Out of Food in the Last Year: Never true    Ran Out of Food in the Last Year: Never true  Transportation Needs: No Transportation Needs (12/04/2022)   Received from [place], [place]   PRAPARE - Transportation    Lack of Transportation (Medical): No    Lack of Transportation (Non-Medical): No    FAMILY HISTORY:  Family History  Problem Relation Name Age of Onset   Diabetes Mother     High blood pressure (Hypertension) Mother     Coronary Artery Disease (Blocked arteries around heart) Father     Breast cancer Sister       GENERAL REVIEW OF SYSTEMS:   General ROS: negative for - chills, fatigue, fever, weight gain or weight loss Allergy and Immunology ROS: negative for - hives   Hematological and Lymphatic ROS: negative for - bleeding problems or bruising, negative for palpable nodes Endocrine ROS: negative for - heat or cold intolerance, hair changes Respiratory ROS: negative for - cough, shortness of breath or wheezing Cardiovascular ROS: no chest pain or palpitations GI ROS: negative for nausea, vomiting, abdominal pain, diarrhea, constipation Musculoskeletal ROS: negative for - joint swelling or muscle pain Neurological ROS: negative for - confusion, syncope Dermatological ROS: negative for pruritus and rash  PHYSICAL EXAM:  Vitals:   04/24/23 0902  BP: 129/76  Pulse: 80  .  Ht:152.4 cm (5') Wt:55.3 kg (122 lb) BSA:Body surface area is 1.53 meters squared. Body mass index is 23.83 kg/m..   GENERAL: Alert, active, oriented x3  LUNGS: Sound clear with no rales rhonchi or wheezes.  HEART: Regular rhythm S1 and S2 without murmur.  BREAST: Both breasts examined in the sitting and supine position.  There is no palpable masses, skin changes, nipple retraction or nipple discharge.  Right breast scar well-healed.  Expected induration over the scar.  EXTREMITIES: Well-developed well-nourished symmetrical with no dependent edema.  NEUROLOGICAL: Awake alert oriented, facial expression symmetrical, moving all extremities.      IMPRESSION:     Malignant neoplasm of lower-inner quadrant of right breast of female, estrogen receptor negative (CMS/HHS-HCC) [C50.311, Z17.1]  -S/p partial mastectomy and sentinel biopsy on 08/16/2022 -She got complete neoadjuvant chemotherapy response -New mammogram showed 10 mm area of distortion.  Core biopsy was done show with a focal area of atypical cells unable to rule out malignancy.  I discussed with patient that this is most likely postoperative/radiation changes.  But with a comment from pathologist that is unable to rule out the malignancy   PLAN:  1.  Radiofrequency tagged excisional biopsy of the right breast (19125) 2.   Avoid taking aspirin 5 days before the surgery 3.  Contact us if you have any concern  Patient or representative verbalized understanding, all questions were answered, and were agreeable with the plan outlined above.   Susan Guillot Cintron-Diaz, MD  Electronically signed by Emitt Maglione Cintron-Diaz, MD  

## 2023-04-24 NOTE — H&P (Signed)
HISTORY OF PRESENT ILLNESS:    [name] is a 67 y.o.female patient who comes for breast cancer surveillance.  [name] was diagnosed with breast cancer on February 2023.  She initially presented with self palpable mass of the right breast.  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  She completed neoadjuvant chemotherapy.  Patient was found with stage IIb, cT2, N0, M0 right breast cancer.  Patient completed 4 cycle of AC Keytruda on 07/31/2022.  She had partial mastectomy with sentinel node biopsy.  She was found with complaint chemotherapy response.  She comes today for surveillance with breast physical exam and mammogram.  She had a diagnostic mammogram that showed 10 mm possible mass on the right breast at the 12 o'clock position.  She had a core biopsy of bleeding area that showed atypical cells.  As per pathologist this focus disappears but cannot rule out malignancy.      PAST MEDICAL HISTORY:  Past Medical History:  Diagnosis Date   Diabetes mellitus type 2 in nonobese (CMS/HHS-HCC)    Hyperlipemia    Hypertension         PAST SURGICAL HISTORY:   Past Surgical History:  Procedure Laterality Date   MASTECTOMY PARTIAL Right 08/16/2022   Dr Arrie Senate --- w/ RF & SN   BREAST EXCISIONAL BIOPSY Right    prior to 2018   CHOLECYSTECTOMY           MEDICATIONS:  Outpatient Encounter Medications as of 04/24/2023  Medication Sig Dispense Refill   atorvastatin (LIPITOR) 40 MG tablet Take 40 mg by mouth once daily     DULoxetine (CYMBALTA) 60 MG DR capsule Take by mouth once daily     enalapril (VASOTEC) 20 MG tablet Take 20 mg by mouth once daily     fenofibrate nanocrystallized (TRICOR) 145 MG tablet Take by mouth once daily     fluticasone propionate (FLONASE) 50 mcg/actuation nasal spray as needed     gabapentin (NEURONTIN) 300 MG capsule Take by mouth 2 (two) times daily     glipiZIDE (GLUCOTROL) 2.5 MG XL tablet Take by mouth once daily      hydroCHLOROthiazide (MICROZIDE) 12.5 mg capsule Take by mouth once daily     meloxicam (MOBIC) 15 MG tablet as needed     metoprolol succinate (TOPROL-XL) 25 MG XL tablet Take 1 tablet by mouth once daily     naproxen (NAPROSYN) 500 MG tablet Take 500 mg by mouth as needed     LIDODERM 5 % patch once daily (Patient not taking: Reported on 08/02/2022)     tiZANidine (ZANAFLEX) 4 MG tablet Take 4 mg by mouth as needed (Patient not taking: Reported on 08/02/2022)     No facility-administered encounter medications on file as of 04/24/2023.     ALLERGIES:   Iodinated contrast media   SOCIAL HISTORY:  Social History   Socioeconomic History   Marital status: Married  Tobacco Use   Smoking status: Never   Smokeless tobacco: Never  Vaping Use   Vaping status: Never Used  Substance and Sexual Activity   Alcohol use: Never   Drug use: Never   Social Determinants of Health   Financial Resource Strain: Low Risk  (02/24/2022)   Received from [facility] [place], [place]   Overall Financial Resource Strain (CARDIA)    Difficulty of Paying Living Expenses: Not very hard  Food Insecurity: No Food Insecurity (12/04/2022)   Received from [facility], [place]   Hunger Vital  Sign    Worried About Programme researcher, broadcasting/film/video in the Last Year: Never true    Ran Out of Food in the Last Year: Never true  Transportation Needs: No Transportation Needs (12/04/2022)   Received from [facility] [place]   PRAPARE - Transportation    Lack of Transportation (Medical): No    Lack of Transportation (Non-Medical): No    FAMILY HISTORY:  Family History  Problem Relation Name Age of Onset   Diabetes Mother     High blood pressure (Hypertension) Mother     Coronary Artery Disease (Blocked arteries around heart) Father     Breast cancer Sister       GENERAL REVIEW OF SYSTEMS:   General ROS: negative for - chills, fatigue, fever, weight gain or weight loss Allergy and Immunology ROS: negative for - hives   Hematological and Lymphatic ROS: negative for - bleeding problems or bruising, negative for palpable nodes Endocrine ROS: negative for - heat or cold intolerance, hair changes Respiratory ROS: negative for - cough, shortness of breath or wheezing Cardiovascular ROS: no chest pain or palpitations GI ROS: negative for nausea, vomiting, abdominal pain, diarrhea, constipation Musculoskeletal ROS: negative for - joint swelling or muscle pain Neurological ROS: negative for - confusion, syncope Dermatological ROS: negative for pruritus and rash  PHYSICAL EXAM:  Vitals:   04/24/23 0902  BP: 129/76  Pulse: 80  .  Ht:152.4 cm (5') Wt:55.3 kg (122 lb) ZOX:WRUE surface area is 1.53 meters squared. Body mass index is 23.83 kg/m.Marland Kitchen   GENERAL: Alert, active, oriented x3  LUNGS: Sound clear with no rales rhonchi or wheezes.  HEART: Regular rhythm S1 and S2 without murmur.  BREAST: Both breasts examined in the sitting and supine position.  There is no palpable masses, skin changes, nipple retraction or nipple discharge.  Right breast scar well-healed.  Expected induration over the scar.  EXTREMITIES: Well-developed well-nourished symmetrical with no dependent edema.  NEUROLOGICAL: Awake alert oriented, facial expression symmetrical, moving all extremities.      IMPRESSION:     Malignant neoplasm of lower-inner quadrant of right breast of female, estrogen receptor negative (CMS/HHS-HCC) [C50.311, Z17.1]  -S/p partial mastectomy and sentinel biopsy on 08/16/2022 -She got complete neoadjuvant chemotherapy response -New mammogram showed 10 mm area of distortion.  Core biopsy was done show with a focal area of atypical cells unable to rule out malignancy.  I discussed with patient that this is most likely postoperative/radiation changes.  But with a comment from pathologist that is unable to rule out the malignancy   PLAN:  1.  Radiofrequency tagged excisional biopsy of the right breast (19125) 2.   Avoid taking aspirin 5 days before the surgery 3.  Contact us if you have any concern  Patient or representative verbalized understanding, all questions were answered, and were agreeable with the plan outlined above.   Carolan Shiver, MD  Electronically signed by Carolan Shiver, MD

## 2023-04-26 ENCOUNTER — Ambulatory Visit
Admission: RE | Admit: 2023-04-26 | Discharge: 2023-04-26 | Disposition: A | Discharge status: Home / Self Care | Payer: Medicare HMO | Source: Ambulatory Visit | Attending: General Surgery | Admitting: General Surgery

## 2023-04-26 ENCOUNTER — Other Ambulatory Visit: Payer: Self-pay | Admitting: General Surgery

## 2023-04-26 ENCOUNTER — Inpatient Hospital Stay: Admission: RE | Admit: 2023-04-26 | Payer: Medicare HMO | Source: Ambulatory Visit

## 2023-04-26 DIAGNOSIS — N6091 Unspecified benign mammary dysplasia of right breast: Secondary | ICD-10-CM | POA: Insufficient documentation

## 2023-04-26 DIAGNOSIS — N6315 Unspecified lump in the right breast, overlapping quadrants: Secondary | ICD-10-CM | POA: Diagnosis not present

## 2023-04-26 MED ORDER — LIDOCAINE HCL 1 % IJ SOLN
5.0000 mL | Freq: Once | INTRAMUSCULAR | Status: AC
  Administered 2023-04-26: 5 mL
  Filled 2023-04-26: qty 5

## 2023-04-27 ENCOUNTER — Encounter
Admission: RE | Admit: 2023-04-27 | Discharge: 2023-04-27 | Disposition: A | Discharge status: Home / Self Care | Payer: Medicare HMO | Source: Ambulatory Visit | Attending: General Surgery | Admitting: General Surgery

## 2023-04-27 VITALS — Ht 61.0 in | Wt 121.3 lb

## 2023-04-27 DIAGNOSIS — I10 Essential (primary) hypertension: Secondary | ICD-10-CM

## 2023-04-27 DIAGNOSIS — Z01818 Encounter for other preprocedural examination: Secondary | ICD-10-CM

## 2023-04-27 DIAGNOSIS — E119 Type 2 diabetes mellitus without complications: Secondary | ICD-10-CM

## 2023-04-27 DIAGNOSIS — Z01812 Encounter for preprocedural laboratory examination: Secondary | ICD-10-CM

## 2023-04-27 DIAGNOSIS — Z79899 Other long term (current) drug therapy: Secondary | ICD-10-CM

## 2023-04-27 NOTE — Patient Instructions (Addendum)
Your procedure is scheduled on:05-04-23 Friday Report to the Registration Desk on the 1st floor of the Medical Mall.Then proceed to the 2nd floor Surgery Desk To find out your arrival time, please call [phone] between 1PM - 3PM on:05-03-23 Thursday If your arrival time is 6:00 am, do not arrive before that time as the Medical Mall entrance doors do not open until 6:00 am.  REMEMBER: Instructions that are not followed completely may result in serious medical risk, up to and including death; or upon the discretion of your surgeon and anesthesiologist your surgery may need to be rescheduled.  Do not eat food OR drink any liquids after midnight the night before surgery.  No gum chewing or hard candies..  One week prior to surgery:Stop NOW 04-27-23 Stop Anti-inflammatories (NSAIDS) such as meloxicam (MOBIC) ,Advil, Aleve, Ibuprofen, Motrin, Naproxen, Naprosyn and Aspirin based products such as Excedrin, Goody's Powder, BC Powder.You may however, take Tylenol if needed for pain up until the day of surgery. Stop ANY OVER THE COUNTER supplements/vitamins NOW (04-27-23) until after surgery (Vitamin C)-You may continue your Melatonin up until the night prior to surgery  Last dose of 81 mg Aspirin was on 04-23-23  TAKE ONLY THESE MEDICATIONS THE MORNING OF SURGERY WITH A SIP OF WATER: -fenofibrate (TRICOR)  -gabapentin (NEURONTIN)  -metoprolol succinate (TOPROL XL)   Continue your enalapril (VASOTEC), glipiZIDE (GLUCOTROL XL), hydrochlorothiazide (MICROZIDE), tiZANidine (ZANAFLEX) up until the day prior to surgery-Do NOT take the morning of surgery  Use your albuterol (PROVENTIL) Nebulizer the morning of surgery and Bring your albuterol (VENTOLIN HFA) Inhaler to the hospital  No Alcohol for 24 hours before or after surgery.  No Smoking including e-cigarettes for 24 hours before surgery.  No chewable tobacco products for at least 6 hours before surgery.  No nicotine patches on the day of  surgery.  Do not use any "recreational" drugs for at least a week (preferably 2 weeks) before your surgery.  Please be advised that the combination of cocaine and anesthesia may have negative outcomes, up to and including death. If you test positive for cocaine, your surgery will be cancelled.  On the morning of surgery brush your teeth with toothpaste and water, you may rinse your mouth with mouthwash if you wish. Do not swallow any toothpaste or mouthwash.  Use CHG Soap as directed on instruction sheet.  Do not wear jewelry, make-up, hairpins, clips or nail polish.  Do not wear lotions, powders, or perfumes.   Do not shave body hair from the neck down 48 hours before surgery.  Contact lenses, hearing aids and dentures may not be worn into surgery.  Do not bring valuables to the hospital. [facility] is not responsible for any missing/lost belongings or valuables.   Notify your doctor if there is any change in your medical condition (cold, fever, infection).  Wear comfortable clothing (specific to your surgery type) to the hospital.  After surgery, you can help prevent lung complications by doing breathing exercises.  Take deep breaths and cough every 1-2 hours. Your doctor may order a device called an Incentive Spirometer to help you take deep breaths. When coughing or sneezing, hold a pillow firmly against your incision with both hands. This is called "splinting." Doing this helps protect your incision. It also decreases belly discomfort.  If you are being admitted to the hospital overnight, leave your suitcase in the car. After surgery it may be brought to your room.  In case of increased patient census, it may  be necessary for you, the patient, to continue your postoperative care in the Same Day Surgery department.  If you are being discharged the day of surgery, you will not be allowed to drive home. You will need a responsible individual to drive you home and stay with you  for 24 hours after surgery.   If you are taking public transportation, you will need to have a responsible individual with you.  Please call the Pre-admissions Testing Dept. at [phone] if you have any questions about these instructions.  Surgery Visitation Policy:  Patients having surgery or a procedure may have two visitors.  Children under the age of 12 must have an adult with them who is not the patient.  Su procedimiento est programado para el: 05-04-23 viernes Presntese en el mostrador de Chartered certified accountant piso del centro comercial mdico. Luego dirjase al mostrador de ciruga del segundo piso. Para saber su hora de llegada, llame al [phone] entre la 1:00 p. m. y las 3:00 p. m. Payton Mccallum jueves: 05-03-23 Si su hora de llegada es a las 6:00 am, no llegue antes de esa hora ya que las puertas de [place] del Medical Mall no se abren Teacher, adult education las 6:00 am.  RECORDAR: Las instrucciones que no se siguen completamente pueden generar riesgos mdicos graves, que pueden llegar hasta la [place]; o, segn el criterio de su cirujano y Scientific laboratory technician, es posible que sea Aeronautical engineer su Leisure centre manager.  No coma ni beba lquidos despus de la medianoche del da anterior a la ciruga.  No mascar chicle ni caramelos duros.  Una semana antes de la cirugaRebbeca Paul 24-05-24 Deje de tomar antiinflamatorios (AINE) como meloxicam (MOBIC), Advil, Aleve, ibuprofeno, Motrin, naproxeno, naprosyn y productos a base de aspirina como Excedrin, Goody's Powder, BC Powder. Sin embargo, puede tomar Tylenol si es necesario para Engineer, materials. hasta el da de la [place]. Deje de CUALQUIER suplemento/vitamina de venta libre AHORA (24/05/24) hasta despus de la ciruga (Vitamina C). Puede continuar tomando [employer] noche anterior a la [place].  La ltima dosis de 81 mg de aspirina fue el 20/05/24.  TOME SLO ESTOS MEDICAMENTOS LA MAANA DE LA CIRUGA CON UN SORBO DE AGUA: -fenofibrato  (TRICOR)  -gabapentina (NEURONTIN)  -succinato de metoprolol (TOPROL XL)   Contine con enalapril (VASOTEC), glipiZIDE (GLUCOTROL XL), hidroclorotiazida (MICROZIDE), tiZANidina (ZANAFLEX) hasta el da anterior a la ciruga; NO tome la maana de la [place].  Utilice su nebulizador de albuterol (PROVENTIL) la maana de la ciruga y lleve su inhalador de albuterol (VENTOLIN HFA) al hospital.  No consumir alcohol durante 24 horas antes o despus de la [place].  No fumar, incluidos los cigarrillos electrnicos, durante las 24 horas previas a la [place].  No consumir productos de tabaco masticables durante al menos 6 horas antes de la [place].  Sin parches de Optometrist de la [place].  No use ningn medicamento "recreativo" durante al menos una semana (preferiblemente 2 semanas) antes de la ciruga.  Tenga en cuenta que la combinacin de cocana y anestesia puede [employer], que pueden llegar hasta la [place]. Si su prueba de cocana da positivo, su ciruga ser cancelada.  La maana de la ciruga cepille sus dientes con pasta dental y agua, puede enjuagarse la boca con enjuague bucal si lo desea. No ingiera pasta de dientes ni enjuague bucal.  Utilice el jabn CHG como se indica en la hoja de instrucciones.  No use joyas, maquillaje, horquillas, clips ni esmalte de uas.  No use lociones, polvos ni perfumes.   No se afeite el vello corporal desde el cuello hacia abajo 48 horas antes de la [place].  No se pueden usar lentes de contacto, audfonos ni dentaduras postizas durante la [place].  No lleve objetos de valor al hospital. [facility] no es responsable de ninguna pertenencia u objeto de valor perdido o perdido.   Notifique a su mdico si hay algn cambio en su condicin mdica (resfriado, fiebre, infeccin).  Lleve ropa cmoda (especfica para su tipo de [place]) al hospital.  Despus de la ciruga, usted puede ayudar a prevenir complicaciones pulmonares  haciendo ejercicios de respiracin.  Respire profundamente y tosa cada 1 o 2 horas. Su mdico puede indicarle un dispositivo llamado espirmetro incentivador para ayudarle a respirar profundamente. Al toser o Engineering geologist, sostenga firmemente una almohada contra la incisin con ambas manos. Esto se llama "ferulizacin". Hacer esto ayuda a proteger su incisin. Tambin disminuye las molestias abdominales.  Si vas a pasar la noche en el hospital, deja tu maleta en el coche. Despus de la [place], es posible que lo lleven a su habitacin.  En caso de un mayor censo de [place], puede ser necesario que usted, el [place], contine con su atencin posoperatoria en el departamento de [place] el [place].  Si le dan el alta el da de la [place], no se le permitir conducir a casa. Necesitar que una persona responsable lo lleve a su casa y se quede con usted durante las 24 horas posteriores a la [place].   Si viaja en transporte pblico, deber ir acompaado de una persona responsable.  Llame al Departamento de pruebas previas a la admisin al [phone] si tiene alguna pregunta sobre estas instrucciones.  Poltica de visitas a ciruga:  Los [employer] se someten a [place] o un procedimiento pueden [employer] visitantes.  Los nios menores de 16 aos deben estar acompaados por un adulto que no sea el [place]. Send feedback   Preparacin para la ciruga con jabn de GLUCONATO DE CLORHEXIDINA (CHG)  Jabn de gluconato de clorhexidina (CHG)  o Un limpiador antisptico que [employer] grmenes y se adhiere a la piel para seguir [employer] grmenes incluso despus del lavado.  o Se utiliza para ducharse la noche anterior a la [place] y la maana de la [place].  Antes de la [place], usted puede desempear un papel importante al reducir la cantidad de grmenes en su piel. El jabn CHG (gluconato de clorhexidina) es un limpiador antisptico que mata los grmenes y se adhiere a la piel para  continuar matndolos incluso despus del lavado.  No lo utilice si es alrgico al CHG o a los jabones antibacterianos. Si su piel se enrojece o irrita, deje de usar CHG.  1. Ducharse la [place]OC[place] ANTES DE LA CIRUGA y la [place] DE LA CIRUGA con jabn CHG.  2. Si eliges lavarte el cabello, lvalo primero como de costumbre con tu champ habitual.  3. Despus del champ, enjuague bien el cabello y el cuerpo para eliminar el champ.  4. Utilice CHG como lo hara con cualquier otro jabn lquido. Puede aplicar CHG directamente sobre la piel y lavar suavemente con un pauelo o una toallita limpia.  5. Aplique el jabn CHG en su cuerpo nicamente desde el cuello hacia abajo. No utilizar en heridas abiertas o llagas abiertas. Evite el contacto con los ojos, odos, boca y genitales (partes privadas). Lvese la cara y los genitales (partes privadas) con su jabn habitual.  6.  Lvese bien, prestando especial atencin al rea donde se realizar su ciruga.  7. Enjuague bien su cuerpo con agua tibia.  8. No se duche ni se lave con su jabn normal despus de usar y enjuagar el jabn CHG.  9. Squese dando palmaditas con una toalla limpia.  10. Use pijamas limpios para dormir la noche anterior a la ciruga.  12. Coloque sbanas limpias en su cama la noche de su primera ducha y no duerma con mascotas.  13. Ducharse nuevamente con el jabn CHG el da de la ciruga antes de llegar al hospital.  14. No aplique desodorantes, lociones o polvos.  15. Por favor use ropa limpia al hospital.

## 2023-05-01 ENCOUNTER — Encounter
Admission: RE | Admit: 2023-05-01 | Discharge: 2023-05-01 | Disposition: A | Discharge status: Home / Self Care | Payer: Medicare HMO | Source: Ambulatory Visit | Attending: General Surgery | Admitting: General Surgery

## 2023-05-01 DIAGNOSIS — Z01812 Encounter for preprocedural laboratory examination: Secondary | ICD-10-CM | POA: Insufficient documentation

## 2023-05-01 DIAGNOSIS — E119 Type 2 diabetes mellitus without complications: Secondary | ICD-10-CM | POA: Diagnosis not present

## 2023-05-01 DIAGNOSIS — Z79899 Other long term (current) drug therapy: Secondary | ICD-10-CM | POA: Insufficient documentation

## 2023-05-01 DIAGNOSIS — I10 Essential (primary) hypertension: Secondary | ICD-10-CM | POA: Insufficient documentation

## 2023-05-01 LAB — BASIC METABOLIC PANEL
Anion gap: 9 (ref 5–15)
BUN: 30 mg/dL — ABNORMAL HIGH (ref 8–23)
CO2: 28 mmol/L (ref 22–32)
Calcium: 9.2 mg/dL (ref 8.9–10.3)
Chloride: 98 mmol/L (ref 98–111)
Creatinine, Ser: 0.66 mg/dL (ref 0.44–1.00)
GFR, Estimated: >60 mL/min (ref >60)
Glucose, Bld: 171 mg/dL — ABNORMAL HIGH (ref 70–99)
Potassium: 3.5 mmol/L (ref 3.5–5.1)
Sodium: 135 mmol/L (ref 135–145)

## 2023-05-02 ENCOUNTER — Ambulatory Visit
Admission: RE | Admit: 2023-05-02 | Discharge: 2023-05-02 | Disposition: A | Discharge status: Home / Self Care | Payer: Medicare HMO | Source: Ambulatory Visit | Attending: Oncology | Admitting: Oncology

## 2023-05-02 DIAGNOSIS — Z171 Estrogen receptor negative status [ER-]: Secondary | ICD-10-CM | POA: Diagnosis present

## 2023-05-02 DIAGNOSIS — R911 Solitary pulmonary nodule: Secondary | ICD-10-CM | POA: Diagnosis present

## 2023-05-02 DIAGNOSIS — C50311 Malignant neoplasm of lower-inner quadrant of right female breast: Secondary | ICD-10-CM

## 2023-05-04 ENCOUNTER — Encounter: Payer: Self-pay | Admitting: General Surgery

## 2023-05-04 ENCOUNTER — Ambulatory Visit: Payer: Medicare HMO | Admitting: Urgent Care

## 2023-05-04 ENCOUNTER — Other Ambulatory Visit: Payer: Self-pay

## 2023-05-04 ENCOUNTER — Ambulatory Visit: Payer: Medicare HMO | Admitting: General Practice

## 2023-05-04 ENCOUNTER — Ambulatory Visit
Admission: RE | Admit: 2023-05-04 | Discharge: 2023-05-04 | Disposition: A | Discharge status: Home / Self Care | Payer: Medicare HMO | Source: Ambulatory Visit | Attending: General Surgery | Admitting: General Surgery

## 2023-05-04 ENCOUNTER — Ambulatory Visit
Admission: RE | Admit: 2023-05-04 | Discharge: 2023-05-04 | Disposition: A | Discharge status: Home / Self Care | Payer: Medicare HMO | Attending: General Surgery | Admitting: General Surgery

## 2023-05-04 ENCOUNTER — Encounter
Admission: RE | Disposition: A | Discharge status: Home / Self Care | Payer: Self-pay | Source: Home / Self Care | Attending: General Surgery

## 2023-05-04 DIAGNOSIS — I509 Heart failure, unspecified: Secondary | ICD-10-CM | POA: Diagnosis not present

## 2023-05-04 DIAGNOSIS — E1165 Type 2 diabetes mellitus with hyperglycemia: Secondary | ICD-10-CM | POA: Diagnosis not present

## 2023-05-04 DIAGNOSIS — Z853 Personal history of malignant neoplasm of breast: Secondary | ICD-10-CM | POA: Diagnosis not present

## 2023-05-04 DIAGNOSIS — I110 Hypertensive heart disease with heart failure: Secondary | ICD-10-CM | POA: Insufficient documentation

## 2023-05-04 DIAGNOSIS — N6091 Unspecified benign mammary dysplasia of right breast: Secondary | ICD-10-CM

## 2023-05-04 DIAGNOSIS — Z9221 Personal history of antineoplastic chemotherapy: Secondary | ICD-10-CM | POA: Insufficient documentation

## 2023-05-04 DIAGNOSIS — Z08 Encounter for follow-up examination after completed treatment for malignant neoplasm: Secondary | ICD-10-CM | POA: Diagnosis present

## 2023-05-04 DIAGNOSIS — F419 Anxiety disorder, unspecified: Secondary | ICD-10-CM | POA: Insufficient documentation

## 2023-05-04 DIAGNOSIS — Z01818 Encounter for other preprocedural examination: Secondary | ICD-10-CM

## 2023-05-04 DIAGNOSIS — F32A Depression, unspecified: Secondary | ICD-10-CM | POA: Diagnosis not present

## 2023-05-04 HISTORY — PX: BREAST BIOPSY WITH RADIO FREQUENCY LOCALIZER: SHX6895

## 2023-05-04 LAB — GLUCOSE, CAPILLARY
Glucose-Capillary: 100 mg/dL — ABNORMAL HIGH (ref 70–99)
Glucose-Capillary: 93 mg/dL (ref 70–99)

## 2023-05-04 SURGERY — BREAST BIOPSY WITH RADIO FREQUENCY LOCALIZER
Anesthesia: General | Laterality: Right

## 2023-05-04 MED ORDER — EPINEPHRINE PF 1 MG/ML IJ SOLN
INTRAMUSCULAR | Status: AC
  Filled 2023-05-04: qty 1

## 2023-05-04 MED ORDER — LIDOCAINE HCL (CARDIAC) PF 100 MG/5ML IV SOSY
PREFILLED_SYRINGE | INTRAVENOUS | Status: DC | PRN
  Administered 2023-05-04: 60 mg via INTRAVENOUS

## 2023-05-04 MED ORDER — PROPOFOL 10 MG/ML IV BOLUS
INTRAVENOUS | Status: AC
  Filled 2023-05-04: qty 20

## 2023-05-04 MED ORDER — PROPOFOL 10 MG/ML IV BOLUS
INTRAVENOUS | Status: DC | PRN
  Administered 2023-05-04: 150 mg via INTRAVENOUS

## 2023-05-04 MED ORDER — MIDAZOLAM HCL 2 MG/2ML IJ SOLN
INTRAMUSCULAR | Status: AC
  Filled 2023-05-04: qty 2

## 2023-05-04 MED ORDER — PHENYLEPHRINE HCL (PRESSORS) 10 MG/ML IV SOLN
INTRAVENOUS | Status: DC | PRN
  Administered 2023-05-04: 120 ug via INTRAVENOUS
  Administered 2023-05-04 (×2): 80 ug via INTRAVENOUS
  Administered 2023-05-04: 160 ug via INTRAVENOUS
  Administered 2023-05-04: 80 ug via INTRAVENOUS

## 2023-05-04 MED ORDER — FENTANYL CITRATE (PF) 100 MCG/2ML IJ SOLN: 25.0000 ug | INTRAMUSCULAR | Status: DC | PRN

## 2023-05-04 MED ORDER — BUPIVACAINE-EPINEPHRINE (PF) 0.5% -1:200000 IJ SOLN
INTRAMUSCULAR | Status: DC | PRN
  Administered 2023-05-04: 7 mL

## 2023-05-04 MED ORDER — EPHEDRINE SULFATE (PRESSORS) 50 MG/ML IJ SOLN
INTRAMUSCULAR | Status: DC | PRN
  Administered 2023-05-04 (×3): 5 mg via INTRAVENOUS

## 2023-05-04 MED ORDER — CEFAZOLIN SODIUM-DEXTROSE 2-4 GM/100ML-% IV SOLN
INTRAVENOUS | Status: AC
  Filled 2023-05-04: qty 100

## 2023-05-04 MED ORDER — STERILE WATER FOR IRRIGATION IR SOLN
Status: DC | PRN
  Administered 2023-05-04: 500 mL

## 2023-05-04 MED ORDER — FENTANYL CITRATE (PF) 100 MCG/2ML IJ SOLN
INTRAMUSCULAR | Status: AC
  Filled 2023-05-04: qty 2

## 2023-05-04 MED ORDER — FAMOTIDINE 20 MG PO TABS
20.0000 mg | ORAL_TABLET | Freq: Once | ORAL | Status: AC
  Administered 2023-05-04: 20 mg via ORAL

## 2023-05-04 MED ORDER — FENTANYL CITRATE (PF) 100 MCG/2ML IJ SOLN
INTRAMUSCULAR | Status: DC | PRN
  Administered 2023-05-04: 25 ug via INTRAVENOUS

## 2023-05-04 MED ORDER — SODIUM CHLORIDE 0.9 % IV SOLN: INTRAVENOUS | Status: DC

## 2023-05-04 MED ORDER — DEXAMETHASONE SODIUM PHOSPHATE 10 MG/ML IJ SOLN
INTRAMUSCULAR | Status: DC | PRN
  Administered 2023-05-04: 5 mg via INTRAVENOUS

## 2023-05-04 MED ORDER — CHLORHEXIDINE GLUCONATE 0.12 % MT SOLN
15.0000 mL | Freq: Once | OROMUCOSAL | Status: AC
  Administered 2023-05-04: 15 mL via OROMUCOSAL

## 2023-05-04 MED ORDER — OXYCODONE HCL 5 MG PO TABS: 5.0000 mg | ORAL_TABLET | Freq: Once | ORAL | Status: DC | PRN

## 2023-05-04 MED ORDER — FAMOTIDINE 20 MG PO TABS
ORAL_TABLET | ORAL | Status: AC
  Filled 2023-05-04: qty 1

## 2023-05-04 MED ORDER — OXYCODONE HCL 5 MG/5ML PO SOLN: 5.0000 mg | Freq: Once | ORAL | Status: DC | PRN

## 2023-05-04 MED ORDER — LACTATED RINGERS IV SOLN: INTRAVENOUS | Status: DC | PRN

## 2023-05-04 MED ORDER — ORAL CARE MOUTH RINSE: 15.0000 mL | Freq: Once | OROMUCOSAL | Status: AC

## 2023-05-04 MED ORDER — BUPIVACAINE HCL (PF) 0.5 % IJ SOLN
INTRAMUSCULAR | Status: AC
  Filled 2023-05-04: qty 30

## 2023-05-04 MED ORDER — CEFAZOLIN SODIUM-DEXTROSE 2-4 GM/100ML-% IV SOLN
2.0000 g | INTRAVENOUS | Status: AC
  Administered 2023-05-04: 2 g via INTRAVENOUS

## 2023-05-04 MED ORDER — ONDANSETRON HCL 4 MG/2ML IJ SOLN
INTRAMUSCULAR | Status: DC | PRN
  Administered 2023-05-04: 4 mg via INTRAVENOUS

## 2023-05-04 MED ORDER — MIDAZOLAM HCL 2 MG/2ML IJ SOLN
INTRAMUSCULAR | Status: DC | PRN
  Administered 2023-05-04: 1 mg via INTRAVENOUS

## 2023-05-04 MED ORDER — HYDROCODONE-ACETAMINOPHEN 5-325 MG PO TABS: 1.0000 | ORAL_TABLET | ORAL | 0 refills | Status: AC | PRN

## 2023-05-04 MED ORDER — CHLORHEXIDINE GLUCONATE 0.12 % MT SOLN
OROMUCOSAL | Status: AC
  Filled 2023-05-04: qty 15

## 2023-05-04 MED ORDER — PHENYLEPHRINE HCL-NACL 20-0.9 MG/250ML-% IV SOLN
INTRAVENOUS | Status: DC | PRN
  Administered 2023-05-04: 25 ug/min via INTRAVENOUS

## 2023-05-04 SURGICAL SUPPLY — 41 items
ADH SKN CLS APL DERMABOND .7 (GAUZE/BANDAGES/DRESSINGS) ×1
APL PRP STRL LF DISP 70% ISPRP (MISCELLANEOUS) ×1
BLADE SURG 15 STRL LF DISP TIS (BLADE) ×1 IMPLANT
BLADE SURG 15 STRL SS (BLADE) ×1
CHLORAPREP W/TINT 26 (MISCELLANEOUS) ×1 IMPLANT
CNTNR URN SCR LID CUP LEK RST (MISCELLANEOUS) ×1 IMPLANT
CONT SPEC 4OZ STRL OR WHT (MISCELLANEOUS) ×1
DERMABOND ADVANCED .7 DNX12 (GAUZE/BANDAGES/DRESSINGS) ×1 IMPLANT
DEVICE DUBIN SPECIMEN MAMMOGRA (MISCELLANEOUS) ×1 IMPLANT
DRAPE LAPAROTOMY TRNSV 106X77 (MISCELLANEOUS) ×1 IMPLANT
ELECT CAUTERY BLADE TIP 2.5 (TIP) ×1
ELECT REM PT RETURN 9FT ADLT (ELECTROSURGICAL) ×1
ELECTRODE CAUTERY BLDE TIP 2.5 (TIP) ×1 IMPLANT
ELECTRODE REM PT RTRN 9FT ADLT (ELECTROSURGICAL) ×1 IMPLANT
GAUZE 4X4 16PLY ~~LOC~~+RFID DBL (SPONGE) ×1 IMPLANT
GLOVE BIO SURGEON STRL SZ 6.5 (GLOVE) ×1 IMPLANT
GLOVE BIOGEL PI IND STRL 6.5 (GLOVE) ×1 IMPLANT
GOWN STRL REUS W/ TWL LRG LVL3 (GOWN DISPOSABLE) ×3 IMPLANT
GOWN STRL REUS W/TWL LRG LVL3 (GOWN DISPOSABLE) ×3
KIT MARKER MARGIN INK (KITS) IMPLANT
KIT TURNOVER KIT A (KITS) ×1 IMPLANT
LABEL OR SOLS (LABEL) ×1 IMPLANT
MANIFOLD NEPTUNE II (INSTRUMENTS) ×1 IMPLANT
MARKER MARGIN CORRECT CLIP (MARKER) IMPLANT
NDL HYPO 25X1 1.5 SAFETY (NEEDLE) ×1 IMPLANT
NEEDLE HYPO 25X1 1.5 SAFETY (NEEDLE) ×1 IMPLANT
PACK BASIN MINOR ARMC (MISCELLANEOUS) ×1 IMPLANT
RETRACTOR RING XSMALL (MISCELLANEOUS) IMPLANT
RTRCTR WOUND ALEXIS 13CM XS SH (MISCELLANEOUS)
SET LOCALIZER 20 PROBE US (MISCELLANEOUS) ×1 IMPLANT
SUT MNCRL 4-0 (SUTURE) ×1
SUT MNCRL 4-0 27XMFL (SUTURE) ×1
SUT SILK 2 0 SH (SUTURE) ×1 IMPLANT
SUT VIC AB 3-0 SH 27 (SUTURE) ×1
SUT VIC AB 3-0 SH 27X BRD (SUTURE) ×1 IMPLANT
SUTURE MNCRL 4-0 27XMF (SUTURE) ×1 IMPLANT
SYR 10ML LL (SYRINGE) ×1 IMPLANT
SYR BULB IRRIG 60ML STRL (SYRINGE) ×1 IMPLANT
TRAP FLUID SMOKE EVACUATOR (MISCELLANEOUS) ×1 IMPLANT
TRAP NEPTUNE SPECIMEN COLLECT (MISCELLANEOUS) ×1 IMPLANT
WATER STERILE IRR 500ML POUR (IV SOLUTION) ×1 IMPLANT

## 2023-05-04 NOTE — Op Note (Signed)
Preoperative diagnosis: Right breast atypical cells.  Postoperative diagnosis: Right breast atypical cells.   Procedure: Right radiofrequency tag-localized excisional biopsy.                      Anesthesia: GETA  Surgeon: Dr. Hazle Quant  Wound Classification: Clean  Indications: Patient is a 67 y.o. female with a nonpalpable right breast mass noted on mammography with core biopsy demonstrating atypical cell requires radiofrequency tag-localized excisional biopsy to rule out malignancy. Patient with history of right breast triple negative breast cancer.   Findings: 1. Specimen mammography shows marker and tag on specimen 2. No other palpable mass or lymph node identified.   Description of procedure: Preoperative radiofrequency tag localization was performed by radiology. The patient was taken to the operating room and placed supine on the operating table, and after general anesthesia the right chest was prepped and draped in the usual sterile fashion. A time-out was completed verifying correct patient, procedure, site, positioning, and implant(s) and/or special equipment prior to beginning this procedure.  By comparing the localization studies and interrogation with Localizer device, the probable trajectory and location of the mass was visualized. A circumareolar skin incision was planned in such a way as to minimize the amount of dissection to reach the mass.  The skin incision was made. Flaps were raised and the location of the tag was confirmed with Localizer device confirmed. A 2-0 silk figure-of-eight stay suture was placed and used for retraction. Dissection was then taken down circumferentially, taking care to include the entire localizing tag and a wide margin of grossly normal tissue.  The specimen and entire localizing tag were removed. The specimen was oriented and sent to radiology with the localization studies. No tag or marker was seen on the specimen. The RF tag was seen and  removed medial to the specimen. Additional excision was done and new specimen mammogram showed the biopsy marker in place. The wound was irrigated. Hemostasis was checked. The wound was closed with interrupted sutures of 3-0 Vicryl and a subcuticular suture of Monocryl 3-0. No attempt was made to close the dead space.   Specimen: Right excisional biopsy                   Complications: None  Estimated Blood Loss: 5 mL

## 2023-05-04 NOTE — Discharge Instructions (Addendum)
Diet: Resume home heart healthy regular diet.   Activity: No heavy lifting >20 pounds (children, pets, laundry, garbage) or strenuous activity until follow-up, but light activity and walking are encouraged. Do not drive or drink alcohol if taking narcotic pain medications.  Wound care: May shower with soapy water and pat dry (do not rub incisions), but no baths or submerging incision underwater until follow-up. (no swimming)   Medications: Resume all home medications. For mild to moderate pain: acetaminophen (Tylenol) or ibuprofen (if no kidney disease). Combining Tylenol with alcohol can substantially increase your risk of causing liver disease. Narcotic pain medications, if prescribed, can be used for severe pain, though may cause nausea, constipation, and drowsiness. Do not combine Tylenol and Norco within a 6 hour period as Norco contains Tylenol. If you do not need the narcotic pain medication, you do not need to fill the prescription.   nstrucciones   Dieta: Reanude la dieta habitual casera y saludable para el corazn.    Actividad: No levantar objetos pesados de ms de 20 libras (nios, mascotas, lavandera, basura) ni realizar actividades extenuantes [employer], pero se recomienda Education officer, environmental actividades ligeras y Advertising account planner. No conduzca ni beba alcohol si toma analgsicos narcticos.   Cuidado de la herida: Puede ducharse con agua y jabn y secarse con palmaditas (no frotar las incisiones), pero no baarse ni sumergir la incisin bajo el agua Artist. (no nadar)    Medicamentos: Reanudar todos los medicamentos caseros. Para dolor leve a moderado: acetaminofn (Tylenol) o ibuprofeno (si no hay enfermedad renal). La combinacin de Tylenol con alcohol puede aumentar sustancialmente el riesgo de causar enfermedad heptica. Los analgsicos narcticos, si se recetan, se pueden usar para el dolor intenso, aunque pueden causar nuseas, estreimiento y somnolencia. No combine  Tylenol y Norco dentro de un perodo de 6 horas ya que Norco contiene Tylenol. Si no necesita el analgsico narctico, no es necesario que Teaching laboratory technician.   Llame a la oficina [phone]) en cualquier momento si tiene alguna pregunta, empeoramiento del dolor, fiebre/escalofros, sangrado, drenaje del sitio de la incisin u otras inquietudes.    Call office [phone]) at any time if any questions, worsening pain, fevers/chills, bleeding, drainage from incision site, or other concerns.      CIRUGIA AMBULATORIA       Instruccionnes de alta     1.  Las drogas que se Dispensing optician en su cuerpo [employer], asi            que por las proximas 24 horas usted no debe:   Conducir Field seismologist) un automovil   Hacer ninguna decision legal   Tomar ninguna bebida alcoholica  2.  A) Manana puede comenzar una dieta regular.  Es mejor que hoy empiece con           liquidos y gradualmente anada [address]       B) Puede comer cualquier comida que desee pero es mejor empezar con liquidos,                      luego sopitas con galletas saladas y gradualmente llegar a las comidas solidas.  3.  Por favor avise a su medico inmediatamente si usted tiene algun sangrado anormal,       tiene dificultad con la respiracion, enrojecimiento y Engineer, mining en el sitio de la cirugia, [place],       fiebro o dolor que se alivia con [place].  4.  A) Su visita posoperatoria (despues  de su operacion) es con el         B)  Por favor llame para hacer la cita posoperatoria.  5.  Istrucciones especificas :

## 2023-05-04 NOTE — Anesthesia Postprocedure Evaluation (Signed)
Anesthesia Post Note  Patient: Susan [name]  Procedure(s) Performed: BREAST BIOPSY WITH RADIO FREQUENCY LOCALIZER (Right)  Patient location during evaluation: PACU Anesthesia Type: General Level of consciousness: awake and alert Pain management: pain level controlled Vital Signs Assessment: post-procedure vital signs reviewed and stable Respiratory status: spontaneous breathing, nonlabored ventilation, respiratory function stable and patient connected to nasal cannula oxygen Cardiovascular status: blood pressure returned to baseline and stable Postop Assessment: no apparent nausea or vomiting Anesthetic complications: no  No notable events documented.   Last Vitals:  Vitals:   05/04/23 1200 05/04/23 1219  BP: (!) 143/76 (!) 147/85  Pulse: 70 73  Resp:  15  Temp:  36.6 C  SpO2: 98% 95%    Last Pain:  Vitals:   05/04/23 1219  TempSrc: Temporal  PainSc: 2                  Stephanie Coup

## 2023-05-04 NOTE — Anesthesia Procedure Notes (Signed)
Procedure Name: LMA Insertion Date/Time: 05/04/2023 10:30 AM  Performed by: Milagros Reap, CRNAPre-anesthesia Checklist: Patient identified, Emergency Drugs available, Suction available, Patient being monitored and Timeout performed Patient Re-evaluated:Patient Re-evaluated prior to induction Oxygen Delivery Method: Circle system utilized Preoxygenation: Pre-oxygenation with 100% oxygen Induction Type: IV induction Ventilation: Mask ventilation without difficulty LMA: LMA inserted LMA Size: 4.0 Number of attempts: 1 Airway Equipment and Method: Patient positioned with wedge pillow Placement Confirmation: positive ETCO2 Dental Injury: Teeth and Oropharynx as per pre-operative assessment  Comments: Eyes taped ou

## 2023-05-04 NOTE — Anesthesia Preprocedure Evaluation (Addendum)
Anesthesia Evaluation  Patient identified by MRN, date of birth, ID band Patient awake    Reviewed: Allergy & Precautions, NPO status , Patient's Chart, lab work & pertinent test results  History of Anesthesia Complications Negative for: history of anesthetic complications  Airway Mallampati: II  TM Distance: >3 FB Neck ROM: Full    Dental  (+) Missing, Dental Advidsory Given   Pulmonary neg pulmonary ROS, neg shortness of breath, pneumonia, resolved   Pulmonary exam normal breath sounds clear to auscultation       Cardiovascular hypertension, +CHF  negative cardio ROS Normal cardiovascular exam Rhythm:Regular Rate:Normal  ECG 03/02/22: normal   Neuro/Psych Seizures - (last sz 21 years ago),  PSYCHIATRIC DISORDERS Anxiety Depression    negative neurological ROS  negative psych ROS   GI/Hepatic negative GI ROS, Neg liver ROS,,,  Endo/Other  negative endocrine ROSdiabetes, Poorly Controlled, Type 2    Renal/GU negative Renal ROS     Musculoskeletal   Abdominal   Peds  Hematology negative hematology ROS (+) Breast CA   Anesthesia Other Findings Past Medical History: No date: Breast cancer, right breast (HCC) No date: Diabetes mellitus without complication (HCC) No date: Family history of prostate cancer No date: Gastritis No date: Heart murmur No date: History of chemotherapy No date: History of pancreatitis No date: Hyperlipidemia No date: Hypertension No date: Palpitations No date: Port-A-Cath in place No date: Vitamin B12 deficiency  Past Surgical History: 11/2015: BREAST BIOPSY; Right     Comment:  benign 01/24/2022: BREAST BIOPSY; Right     Comment:  INVASIVE MAMMARY CARCINOMA, NO SPECIAL TYPE 04/18/2023: BREAST BIOPSY; Right     Comment:  u/s bx,  12 o'clock, HEART clip path pending 04/18/2023: BREAST BIOPSY; Right     Comment:  [place] RT BREAST BX W LOC DEV 1ST LESION IMG BX SPEC [place]                GUIDE 04/18/2023 [facility] No date: BREAST CYST EXCISION; Right No date: CESAREAN SECTION     Comment:  2x No date: CHOLECYSTECTOMY 03/28/2022: IR CV LINE INJECTION 08/16/2022: PARTIAL MASTECTOMY WITH AXILLARY SENTINEL LYMPH NODE  BIOPSY; Right     Comment:  Procedure: PARTIAL MASTECTOMY WITH AXILLARY SENTINEL               LYMPH NODE BIOPSY -- RF guided;  Surgeon: Carolan Shiver, MD;  Location: [facility]S;  Service: General;                Laterality: Right; No date: PORT-A-CATH REMOVAL 03/01/2022: PORTACATH PLACEMENT; N/A     Comment:  Procedure: INSERTION PORT-A-CATH;  Surgeon:               Carolan Shiver, MD;  Location: [facility];  Service:              General;  Laterality: N/A;  BMI    Body Mass Index: 22.67 kg/m      Reproductive/Obstetrics negative OB ROS                             Anesthesia Physical Anesthesia Plan  ASA: 3  Anesthesia Plan: General   Post-op Pain Management:    Induction: Intravenous  PONV Risk Score and Plan: 3 and Ondansetron, Dexamethasone and Treatment may vary due to age or medical condition  Airway Management Planned: LMA  Additional  Equipment:   Intra-op Plan:   Post-operative Plan: Extubation in OR  Informed Consent: I have reviewed the patients History and Physical, chart, labs and discussed the procedure including the risks, benefits and alternatives for the proposed anesthesia with the patient or authorized representative who has indicated his/her understanding and acceptance.     Dental Advisory Given  Plan Discussed with: Anesthesiologist, CRNA and Surgeon  Anesthesia Plan Comments: (Patient consented for risks of anesthesia including but not limited to:  - adverse reactions to medications - damage to eyes, teeth, lips or other oral mucosa - nerve damage due to positioning  - sore throat or hoarseness - Damage to heart, brain, nerves, lungs, other parts of body  or loss of life  Patient voiced understanding.)        Anesthesia Quick Evaluation

## 2023-05-04 NOTE — Interval H&P Note (Signed)
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  The various methods of treatment have been discussed with the patient and family. After consideration of risks, benefits and other options for treatment, the patient has consented to  Procedure(s): BREAST BIOPSY WITH RADIO FREQUENCY LOCALIZER (Right) as a surgical intervention.  The patient's history has been reviewed, patient examined, no change in status, stable for surgery.  I have reviewed the patient's chart and labs.  Questions were answered to the patient's satisfaction.     Carolan Shiver

## 2023-05-04 NOTE — Transfer of Care (Signed)
Immediate Anesthesia Transfer of Care Note  Patient: Susan [name]  Procedure(s) Performed: BREAST BIOPSY WITH RADIO FREQUENCY LOCALIZER (Right)  Patient Location: PACU  Anesthesia Type:General  Level of Consciousness: drowsy and patient cooperative  Airway & Oxygen Therapy: Patient Spontanous Breathing and Patient connected to face mask oxygen  Post-op Assessment: Report given to RN and Post -op Vital signs reviewed and stable  Post vital signs: Reviewed and stable  Last Vitals:  Vitals Value Taken Time  BP 144/79 05/04/23 1137  Temp 36.2 C 05/04/23 1137  Pulse 72 05/04/23 1140  Resp 10 05/04/23 1140  SpO2 100 % 05/04/23 1140  Vitals shown include unvalidated device data.  Last Pain:  Vitals:   05/04/23 1137  TempSrc:   PainSc: Asleep         Complications: No notable events documented.

## 2023-05-07 LAB — SURGICAL PATHOLOGY

## 2023-06-12 ENCOUNTER — Inpatient Hospital Stay: Payer: Medicare HMO | Attending: Oncology | Admitting: Oncology

## 2023-08-09 ENCOUNTER — Ambulatory Visit: Payer: Medicare HMO | Attending: Radiation Oncology | Admitting: Radiation Oncology

## 2023-11-19 ENCOUNTER — Encounter: Payer: Self-pay | Admitting: Oncology

## 2024-08-31 ENCOUNTER — Other Ambulatory Visit: Payer: Self-pay
